# Patient Record
Sex: Male | Born: 1955 | Race: White | Hispanic: No | Marital: Single | Smoking: Never smoker
Health system: Southern US, Community
[De-identification: ages and names within clinical notes are randomized; demographics above are authoritative.]

## PROBLEM LIST (undated history)

## (undated) DIAGNOSIS — F40240 Claustrophobia: Secondary | ICD-10-CM

## (undated) DIAGNOSIS — J45909 Unspecified asthma, uncomplicated: Secondary | ICD-10-CM

## (undated) DIAGNOSIS — Q893 Situs inversus: Secondary | ICD-10-CM

## (undated) DIAGNOSIS — N182 Chronic kidney disease, stage 2 (mild): Secondary | ICD-10-CM

## (undated) DIAGNOSIS — E7800 Pure hypercholesterolemia, unspecified: Secondary | ICD-10-CM

## (undated) DIAGNOSIS — I10 Essential (primary) hypertension: Secondary | ICD-10-CM

## (undated) DIAGNOSIS — M069 Rheumatoid arthritis, unspecified: Secondary | ICD-10-CM

## (undated) DIAGNOSIS — I2510 Atherosclerotic heart disease of native coronary artery without angina pectoris: Secondary | ICD-10-CM

## (undated) DIAGNOSIS — F419 Anxiety disorder, unspecified: Secondary | ICD-10-CM

## (undated) DIAGNOSIS — I509 Heart failure, unspecified: Secondary | ICD-10-CM

## (undated) DIAGNOSIS — I639 Cerebral infarction, unspecified: Secondary | ICD-10-CM

## (undated) DIAGNOSIS — G459 Transient cerebral ischemic attack, unspecified: Secondary | ICD-10-CM

## (undated) DIAGNOSIS — K219 Gastro-esophageal reflux disease without esophagitis: Secondary | ICD-10-CM

## (undated) DIAGNOSIS — L4050 Arthropathic psoriasis, unspecified: Secondary | ICD-10-CM

## (undated) DIAGNOSIS — E119 Type 2 diabetes mellitus without complications: Secondary | ICD-10-CM

## (undated) DIAGNOSIS — Z951 Presence of aortocoronary bypass graft: Secondary | ICD-10-CM

## (undated) HISTORY — PX: PATELLA FRACTURE SURGERY: SHX735

## (undated) HISTORY — DX: Cerebral infarction, unspecified: I63.9

## (undated) HISTORY — PX: APPENDECTOMY: SHX54

## (undated) HISTORY — PX: INGUINAL HERNIA REPAIR: SUR1180

## (undated) HISTORY — PX: TIBIA FRACTURE SURGERY: SHX806

## (undated) HISTORY — PX: FRACTURE SURGERY: SHX138

---

## 2009-10-03 HISTORY — PX: CORONARY ARTERY BYPASS GRAFT: SHX141

## 2011-12-04 HISTORY — PX: INCISION AND DRAINAGE: SHX5863

## 2016-02-27 ENCOUNTER — Inpatient Hospital Stay (HOSPITAL_COMMUNITY)
Admission: EM | Admit: 2016-02-27 | Discharge: 2016-03-04 | DRG: 287 | Disposition: A | Discharge status: Home / Self Care | Payer: Medicaid Other | Attending: Student in an Organized Health Care Education/Training Program | Admitting: Student in an Organized Health Care Education/Training Program

## 2016-02-27 ENCOUNTER — Emergency Department (HOSPITAL_COMMUNITY): Payer: Medicaid Other

## 2016-02-27 ENCOUNTER — Encounter (HOSPITAL_COMMUNITY): Payer: Self-pay | Admitting: Emergency Medicine

## 2016-02-27 DIAGNOSIS — I429 Cardiomyopathy, unspecified: Secondary | ICD-10-CM | POA: Diagnosis not present

## 2016-02-27 DIAGNOSIS — Z91138 Patient's unintentional underdosing of medication regimen for other reason: Secondary | ICD-10-CM | POA: Diagnosis not present

## 2016-02-27 DIAGNOSIS — Q893 Situs inversus: Secondary | ICD-10-CM

## 2016-02-27 DIAGNOSIS — I25719 Atherosclerosis of autologous vein coronary artery bypass graft(s) with unspecified angina pectoris: Secondary | ICD-10-CM | POA: Diagnosis present

## 2016-02-27 DIAGNOSIS — J45909 Unspecified asthma, uncomplicated: Secondary | ICD-10-CM | POA: Diagnosis present

## 2016-02-27 DIAGNOSIS — Z9119 Patient's noncompliance with other medical treatment and regimen: Secondary | ICD-10-CM | POA: Diagnosis not present

## 2016-02-27 DIAGNOSIS — E8779 Other fluid overload: Secondary | ICD-10-CM | POA: Diagnosis not present

## 2016-02-27 DIAGNOSIS — I2582 Chronic total occlusion of coronary artery: Secondary | ICD-10-CM | POA: Diagnosis present

## 2016-02-27 DIAGNOSIS — T50996A Underdosing of other drugs, medicaments and biological substances, initial encounter: Secondary | ICD-10-CM | POA: Diagnosis present

## 2016-02-27 DIAGNOSIS — I5023 Acute on chronic systolic (congestive) heart failure: Secondary | ICD-10-CM | POA: Diagnosis not present

## 2016-02-27 DIAGNOSIS — R739 Hyperglycemia, unspecified: Secondary | ICD-10-CM

## 2016-02-27 DIAGNOSIS — Z8249 Family history of ischemic heart disease and other diseases of the circulatory system: Secondary | ICD-10-CM | POA: Diagnosis not present

## 2016-02-27 DIAGNOSIS — N049 Nephrotic syndrome with unspecified morphologic changes: Secondary | ICD-10-CM | POA: Diagnosis present

## 2016-02-27 DIAGNOSIS — T508X5A Adverse effect of diagnostic agents, initial encounter: Secondary | ICD-10-CM | POA: Diagnosis present

## 2016-02-27 DIAGNOSIS — E1121 Type 2 diabetes mellitus with diabetic nephropathy: Secondary | ICD-10-CM | POA: Diagnosis present

## 2016-02-27 DIAGNOSIS — E8770 Fluid overload, unspecified: Secondary | ICD-10-CM | POA: Diagnosis not present

## 2016-02-27 DIAGNOSIS — I110 Hypertensive heart disease with heart failure: Principal | ICD-10-CM | POA: Diagnosis present

## 2016-02-27 DIAGNOSIS — M069 Rheumatoid arthritis, unspecified: Secondary | ICD-10-CM | POA: Diagnosis present

## 2016-02-27 DIAGNOSIS — J810 Acute pulmonary edema: Secondary | ICD-10-CM | POA: Diagnosis not present

## 2016-02-27 DIAGNOSIS — E785 Hyperlipidemia, unspecified: Secondary | ICD-10-CM

## 2016-02-27 DIAGNOSIS — N179 Acute kidney failure, unspecified: Secondary | ICD-10-CM | POA: Diagnosis present

## 2016-02-27 DIAGNOSIS — Z951 Presence of aortocoronary bypass graft: Secondary | ICD-10-CM | POA: Diagnosis not present

## 2016-02-27 DIAGNOSIS — L4050 Arthropathic psoriasis, unspecified: Secondary | ICD-10-CM | POA: Diagnosis present

## 2016-02-27 DIAGNOSIS — E1165 Type 2 diabetes mellitus with hyperglycemia: Secondary | ICD-10-CM | POA: Diagnosis present

## 2016-02-27 DIAGNOSIS — I25810 Atherosclerosis of coronary artery bypass graft(s) without angina pectoris: Secondary | ICD-10-CM | POA: Diagnosis present

## 2016-02-27 DIAGNOSIS — I25119 Atherosclerotic heart disease of native coronary artery with unspecified angina pectoris: Secondary | ICD-10-CM | POA: Diagnosis present

## 2016-02-27 DIAGNOSIS — F418 Other specified anxiety disorders: Secondary | ICD-10-CM | POA: Diagnosis present

## 2016-02-27 DIAGNOSIS — I493 Ventricular premature depolarization: Secondary | ICD-10-CM | POA: Diagnosis present

## 2016-02-27 DIAGNOSIS — N189 Chronic kidney disease, unspecified: Secondary | ICD-10-CM | POA: Diagnosis present

## 2016-02-27 DIAGNOSIS — I272 Other secondary pulmonary hypertension: Secondary | ICD-10-CM | POA: Diagnosis present

## 2016-02-27 DIAGNOSIS — E7800 Pure hypercholesterolemia, unspecified: Secondary | ICD-10-CM | POA: Diagnosis present

## 2016-02-27 DIAGNOSIS — I25708 Atherosclerosis of coronary artery bypass graft(s), unspecified, with other forms of angina pectoris: Secondary | ICD-10-CM | POA: Diagnosis not present

## 2016-02-27 DIAGNOSIS — I2510 Atherosclerotic heart disease of native coronary artery without angina pectoris: Secondary | ICD-10-CM | POA: Diagnosis not present

## 2016-02-27 DIAGNOSIS — Z9114 Patient's other noncompliance with medication regimen: Secondary | ICD-10-CM | POA: Diagnosis not present

## 2016-02-27 DIAGNOSIS — L409 Psoriasis, unspecified: Secondary | ICD-10-CM

## 2016-02-27 DIAGNOSIS — E8809 Other disorders of plasma-protein metabolism, not elsewhere classified: Secondary | ICD-10-CM

## 2016-02-27 DIAGNOSIS — I420 Dilated cardiomyopathy: Secondary | ICD-10-CM | POA: Diagnosis present

## 2016-02-27 DIAGNOSIS — R0602 Shortness of breath: Secondary | ICD-10-CM

## 2016-02-27 DIAGNOSIS — I255 Ischemic cardiomyopathy: Secondary | ICD-10-CM | POA: Diagnosis present

## 2016-02-27 DIAGNOSIS — I011 Acute rheumatic endocarditis: Secondary | ICD-10-CM

## 2016-02-27 DIAGNOSIS — R609 Edema, unspecified: Secondary | ICD-10-CM | POA: Diagnosis present

## 2016-02-27 DIAGNOSIS — I5041 Acute combined systolic (congestive) and diastolic (congestive) heart failure: Secondary | ICD-10-CM | POA: Diagnosis present

## 2016-02-27 DIAGNOSIS — Z955 Presence of coronary angioplasty implant and graft: Secondary | ICD-10-CM | POA: Diagnosis not present

## 2016-02-27 DIAGNOSIS — R808 Other proteinuria: Secondary | ICD-10-CM | POA: Diagnosis not present

## 2016-02-27 DIAGNOSIS — Z882 Allergy status to sulfonamides status: Secondary | ICD-10-CM

## 2016-02-27 DIAGNOSIS — R079 Chest pain, unspecified: Secondary | ICD-10-CM | POA: Diagnosis present

## 2016-02-27 DIAGNOSIS — I5031 Acute diastolic (congestive) heart failure: Secondary | ICD-10-CM | POA: Diagnosis not present

## 2016-02-27 HISTORY — DX: Gastro-esophageal reflux disease without esophagitis: K21.9

## 2016-02-27 HISTORY — DX: Type 2 diabetes mellitus without complications: E11.9

## 2016-02-27 HISTORY — DX: Atherosclerotic heart disease of native coronary artery without angina pectoris: I25.10

## 2016-02-27 HISTORY — DX: Rheumatoid arthritis, unspecified: M06.9

## 2016-02-27 HISTORY — DX: Pure hypercholesterolemia, unspecified: E78.00

## 2016-02-27 HISTORY — DX: Essential (primary) hypertension: I10

## 2016-02-27 HISTORY — DX: Arthropathic psoriasis, unspecified: L40.50

## 2016-02-27 HISTORY — DX: Unspecified asthma, uncomplicated: J45.909

## 2016-02-27 HISTORY — DX: Situs inversus: Q89.3

## 2016-02-27 HISTORY — DX: Anxiety disorder, unspecified: F41.9

## 2016-02-27 HISTORY — DX: Presence of aortocoronary bypass graft: Z95.1

## 2016-02-27 LAB — URINALYSIS, ROUTINE W REFLEX MICROSCOPIC
BILIRUBIN URINE: NEGATIVE
Glucose, UA: >1000 mg/dL — AB
Ketones, ur: NEGATIVE mg/dL
Leukocytes, UA: NEGATIVE
NITRITE: NEGATIVE
SPECIFIC GRAVITY, URINE: 1.03 (ref 1.005–1.030)
pH: 5.5 (ref 5.0–8.0)

## 2016-02-27 LAB — CBC
HCT: 47.4 % (ref 39.0–52.0)
HEMOGLOBIN: 14.8 g/dL (ref 13.0–17.0)
MCH: 26.1 pg (ref 26.0–34.0)
MCHC: 31.2 g/dL (ref 30.0–36.0)
MCV: 83.6 fL (ref 78.0–100.0)
PLATELETS: 276 10*3/uL (ref 150–400)
RBC: 5.67 MIL/uL (ref 4.22–5.81)
RDW: 15 % (ref 11.5–15.5)
WBC: 11.3 10*3/uL — AB (ref 4.0–10.5)

## 2016-02-27 LAB — URINE MICROSCOPIC-ADD ON

## 2016-02-27 LAB — BRAIN NATRIURETIC PEPTIDE: B NATRIURETIC PEPTIDE 5: 2978.8 pg/mL — AB (ref 0.0–100.0)

## 2016-02-27 LAB — LIPASE, BLOOD: Lipase: 49 U/L (ref 11–51)

## 2016-02-27 LAB — COMPREHENSIVE METABOLIC PANEL
ALK PHOS: 94 U/L (ref 38–126)
ALT: 20 U/L (ref 17–63)
ANION GAP: 10 (ref 5–15)
AST: 23 U/L (ref 15–41)
Albumin: 2.6 g/dL — ABNORMAL LOW (ref 3.5–5.0)
BILIRUBIN TOTAL: 0.8 mg/dL (ref 0.3–1.2)
BUN: 24 mg/dL — ABNORMAL HIGH (ref 6–20)
CALCIUM: 8.5 mg/dL — AB (ref 8.9–10.3)
CO2: 22 mmol/L (ref 22–32)
CREATININE: 1.51 mg/dL — AB (ref 0.61–1.24)
Chloride: 100 mmol/L — ABNORMAL LOW (ref 101–111)
GFR, EST AFRICAN AMERICAN: 57 mL/min — AB (ref >60)
GFR, EST NON AFRICAN AMERICAN: 50 mL/min — AB (ref >60)
Glucose, Bld: 408 mg/dL — ABNORMAL HIGH (ref 65–99)
Potassium: 4.4 mmol/L (ref 3.5–5.1)
SODIUM: 132 mmol/L — AB (ref 135–145)
TOTAL PROTEIN: 5.7 g/dL — AB (ref 6.5–8.1)

## 2016-02-27 LAB — I-STAT TROPONIN, ED: TROPONIN I, POC: 0.07 ng/mL (ref 0.00–0.08)

## 2016-02-27 MED ORDER — SODIUM CHLORIDE 0.9% FLUSH
3.0000 mL | Freq: Two times a day (BID) | INTRAVENOUS | Status: DC
  Administered 2016-02-28 – 2016-03-04 (×7): 3 mL via INTRAVENOUS

## 2016-02-27 MED ORDER — PANTOPRAZOLE SODIUM 40 MG PO TBEC
40.0000 mg | DELAYED_RELEASE_TABLET | Freq: Every day | ORAL | Status: DC
  Administered 2016-02-28 – 2016-03-04 (×6): 40 mg via ORAL
  Filled 2016-02-27 (×6): qty 1

## 2016-02-27 MED ORDER — FUROSEMIDE 10 MG/ML IJ SOLN
40.0000 mg | Freq: Two times a day (BID) | INTRAMUSCULAR | Status: DC
  Administered 2016-02-28 – 2016-03-03 (×9): 40 mg via INTRAVENOUS
  Filled 2016-02-27 (×9): qty 4

## 2016-02-27 MED ORDER — INSULIN ASPART 100 UNIT/ML ~~LOC~~ SOLN
0.0000 [IU] | Freq: Three times a day (TID) | SUBCUTANEOUS | Status: DC
  Administered 2016-02-28 (×2): 2 [IU] via SUBCUTANEOUS
  Administered 2016-02-28: 1 [IU] via SUBCUTANEOUS
  Administered 2016-02-29: 2 [IU] via SUBCUTANEOUS
  Administered 2016-02-29: 3 [IU] via SUBCUTANEOUS
  Administered 2016-02-29: 2 [IU] via SUBCUTANEOUS
  Administered 2016-03-01: 3 [IU] via SUBCUTANEOUS

## 2016-02-27 MED ORDER — ENOXAPARIN SODIUM 40 MG/0.4ML ~~LOC~~ SOLN
40.0000 mg | SUBCUTANEOUS | Status: DC
  Administered 2016-02-28 – 2016-03-04 (×5): 40 mg via SUBCUTANEOUS
  Filled 2016-02-27 (×5): qty 0.4

## 2016-02-27 MED ORDER — FUROSEMIDE 10 MG/ML IJ SOLN
40.0000 mg | Freq: Once | INTRAMUSCULAR | Status: AC
  Administered 2016-02-27: 40 mg via INTRAVENOUS
  Filled 2016-02-27: qty 4

## 2016-02-27 NOTE — ED Provider Notes (Signed)
CSN: [id number]     Arrival date & time 02/27/16  1539 History   First MD Initiated Contact with Patient 02/27/16 1946     Chief Complaint  Patient presents with  . Groin Swelling  . Abdominal Pain     (Consider location/radiation/quality/duration/timing/severity/associated sxs/prior Treatment) HPI Comments: Patient with history of CABG 5, situs inversus, uncontrolled diabetes (has been off medications due to no insurance) -- presents with acute onset of gradually worsening lower extremity, scrotal, and lower abdominal edema for 2 weeks. Patient has had worsening fatigue, shortness of breath with exertion. He states that he has been propping himself up at night to sleep and wakes up at times gasping for breath. States unknown weight gain. Shortness of breath resolves after he sits for about 15-20 minutes. No history of cirrhosis or kidney problems. No history of heart failure. Patient is not on any medications including diuretics. No blood thinners. He denies bleeding in the urine or stool. No fever, chest pain. Symptoms improve with rest and are worse with ambulation or activity.   Patient is a 60 y.o. male presenting with abdominal pain. The history is provided by the patient.  Abdominal Pain Associated symptoms: shortness of breath   Associated symptoms: no chest pain, no cough, no diarrhea, no dysuria, no fever, no nausea, no sore throat and no vomiting     Past Medical History  Diagnosis Date  . Dextrocardia   . Diabetes mellitus without complication (HCC)    History reviewed. No pertinent past surgical history. History reviewed. No pertinent family history. Social History  Substance Use Topics  . Smoking status: Never Smoker   . Smokeless tobacco: None  . Alcohol Use: No    Review of Systems  Constitutional: Negative for fever.  HENT: Negative for rhinorrhea and sore throat.   Eyes: Negative for redness.  Respiratory: Positive for shortness of breath. Negative for cough  and wheezing.   Cardiovascular: Positive for leg swelling. Negative for chest pain.  Gastrointestinal: Positive for abdominal pain. Negative for nausea, vomiting and diarrhea.  Genitourinary: Positive for scrotal swelling. Negative for dysuria, frequency, discharge, penile swelling, penile pain and testicular pain.  Musculoskeletal: Negative for myalgias.  Skin: Negative for rash.  Neurological: Negative for headaches.      Allergies  Sulfa antibiotics  Home Medications   Prior to Admission medications   Not on File   BP 139/94 mmHg  Pulse 101  Temp(Src) 98.5 F (36.9 C) (Oral)  Resp 24  SpO2 94% Physical Exam  Constitutional: He appears well-developed and well-nourished.  HENT:  Head: Normocephalic and atraumatic.  Mouth/Throat: Oropharynx is clear and moist.  Eyes: Conjunctivae are normal. Right eye exhibits no discharge. Left eye exhibits no discharge.  Neck: Normal range of motion. Neck supple.  Cardiovascular: Normal rate, regular rhythm and normal heart sounds.   No murmur heard. Situs inversus.   Pulmonary/Chest: Effort normal. No respiratory distress. He has no wheezes. He has rales.  Abdominal: Soft. There is no tenderness. There is no rebound and no guarding.  Genitourinary: Right testis shows swelling. Right testis shows no mass and no tenderness. Left testis shows swelling. Left testis shows no mass and no tenderness. Uncircumcised.  Moderate swelling of scrotum without cellulitis of abscess.   Neurological: He is alert.  Skin: Skin is warm and dry.  Psychiatric: He has a normal mood and affect.  Nursing note and vitals reviewed.   ED Course  Procedures (including critical care time) Labs Review Labs Reviewed  COMPREHENSIVE  METABOLIC PANEL - Abnormal; Notable for the following:    Sodium 132 (*)    Chloride 100 (*)    Glucose, Bld 408 (*)    BUN 24 (*)    Creatinine, Ser 1.51 (*)    Calcium 8.5 (*)    Total Protein 5.7 (*)    Albumin 2.6 (*)     GFR calc non Af Amer 50 (*)    GFR calc Af Amer 57 (*)    All other components within normal limits  CBC - Abnormal; Notable for the following:    WBC 11.3 (*)    All other components within normal limits  URINALYSIS, ROUTINE W REFLEX MICROSCOPIC (NOT AT [facility]) - Abnormal; Notable for the following:    Glucose, UA >1000 (*)    Hgb urine dipstick MODERATE (*)    Protein, ur >300 (*)    All other components within normal limits  BRAIN NATRIURETIC PEPTIDE - Abnormal; Notable for the following:    B Natriuretic Peptide 2978.8 (*)    All other components within normal limits  URINE MICROSCOPIC-ADD ON - Abnormal; Notable for the following:    Squamous Epithelial / LPF 0-5 (*)    Bacteria, UA RARE (*)    Casts HYALINE CASTS (*)    All other components within normal limits  LIPASE, BLOOD  I-STAT TROPOININ, ED    Imaging Review Dg Chest 2 View  02/27/2016  CLINICAL DATA:  Shortness of breath.  CHF.  Situs inversus. EXAM: CHEST  2 VIEW COMPARISON:  None. FINDINGS: Sternotomy wires are aligned. There are discontinuities of the 2 lower most sternotomy wires. CABG clips overlie the mediastinum. There is dextrocardia with mild cardiomegaly. The mediastinal contour otherwise appears normal. No pneumothorax. Trace bilateral pleural effusions. Borderline mild pulmonary edema. Curvilinear opacities at the right lung base. IMPRESSION: 1. Dextrocardia with mild cardiomegaly and borderline mild pulmonary edema, suggesting mild congestive heart failure. 2. Trace bilateral pleural effusions. 3. Curvilinear opacities at the right lung base, favor mild scarring or atelectasis. Electronically Signed   By: Delbert Phenix M.D.   On: 02/27/2016 21:05   I have personally reviewed and evaluated these images and lab results as part of my medical decision-making.   EKG Interpretation   Date/Time:  Monday February 27 2016 19:56:44 EDT Ventricular Rate:  100 PR Interval:  163 QRS Duration: 88 QT Interval:  370 QTC  Calculation: 477 R Axis:   -88 Text Interpretation:  Sinus tachycardia Probable left atrial enlargement  Left anterior fascicular block Low voltage, extremity and precordial leads  RSR' in V1 or V2, probably normal variant Nonspecific T abnormalities,  lateral leads Borderline prolonged QT interval No previous ECGs available  Confirmed by [facility]  MD, ELLIOTT ([id number]) on 02/27/2016 8:24:42 PM       Patient seen and examined. Work-up initiated. Discussed with Dr. Effie Shy.   Vital signs reviewed and are as follows: BP 139/94 mmHg  Pulse 101  Temp(Src) 98.5 F (36.9 C) (Oral)  Resp 24  SpO2 94%  8:35 PM Patient has ambulated with oxygen saturation to 92%.   9:36 PM Patient and friend at bedside updated on results. I have spoken with [facility]MT[facility] who will admit patient. Orders placed.   MDM   Final diagnoses:  Peripheral edema  Shortness of breath  Hyperglycemia  Hypoalbuminemia   Admit. New onset CHF vs nephrosis.    Renne Crigler, PA-C 02/27/16 2137  Mancel Bale, MD 02/28/16 [phone]

## 2016-02-27 NOTE — ED Notes (Signed)
PA at bedside.

## 2016-02-27 NOTE — ED Notes (Signed)
Patient transported to X-ray 

## 2016-02-27 NOTE — ED Notes (Signed)
Admitting at bedside 

## 2016-02-27 NOTE — ED Notes (Addendum)
Spo2 While ambulating 92%

## 2016-02-27 NOTE — ED Notes (Signed)
Pt sts swelling in scrotum and abd area; pt sts swelling in legs x several days

## 2016-02-28 ENCOUNTER — Encounter (HOSPITAL_COMMUNITY): Payer: Self-pay | Admitting: Internal Medicine

## 2016-02-28 ENCOUNTER — Inpatient Hospital Stay (HOSPITAL_COMMUNITY): Payer: Medicaid Other

## 2016-02-28 DIAGNOSIS — I25810 Atherosclerosis of coronary artery bypass graft(s) without angina pectoris: Secondary | ICD-10-CM | POA: Diagnosis present

## 2016-02-28 DIAGNOSIS — I429 Cardiomyopathy, unspecified: Secondary | ICD-10-CM

## 2016-02-28 DIAGNOSIS — N049 Nephrotic syndrome with unspecified morphologic changes: Secondary | ICD-10-CM

## 2016-02-28 DIAGNOSIS — R0600 Dyspnea, unspecified: Secondary | ICD-10-CM

## 2016-02-28 DIAGNOSIS — Q893 Situs inversus: Secondary | ICD-10-CM

## 2016-02-28 DIAGNOSIS — E785 Hyperlipidemia, unspecified: Secondary | ICD-10-CM

## 2016-02-28 DIAGNOSIS — R079 Chest pain, unspecified: Secondary | ICD-10-CM | POA: Diagnosis present

## 2016-02-28 DIAGNOSIS — E8779 Other fluid overload: Secondary | ICD-10-CM

## 2016-02-28 DIAGNOSIS — N179 Acute kidney failure, unspecified: Secondary | ICD-10-CM

## 2016-02-28 DIAGNOSIS — I25708 Atherosclerosis of coronary artery bypass graft(s), unspecified, with other forms of angina pectoris: Secondary | ICD-10-CM

## 2016-02-28 DIAGNOSIS — I011 Acute rheumatic endocarditis: Secondary | ICD-10-CM

## 2016-02-28 DIAGNOSIS — R609 Edema, unspecified: Secondary | ICD-10-CM

## 2016-02-28 DIAGNOSIS — J45909 Unspecified asthma, uncomplicated: Secondary | ICD-10-CM

## 2016-02-28 LAB — GLUCOSE, CAPILLARY
GLUCOSE-CAPILLARY: 215 mg/dL — AB (ref 65–99)
GLUCOSE-CAPILLARY: 157 mg/dL — AB (ref 65–99)
GLUCOSE-CAPILLARY: 142 mg/dL — AB (ref 65–99)
Glucose-Capillary: 134 mg/dL — ABNORMAL HIGH (ref 65–99)
Glucose-Capillary: 200 mg/dL — ABNORMAL HIGH (ref 65–99)
Glucose-Capillary: 394 mg/dL — ABNORMAL HIGH (ref 65–99)

## 2016-02-28 LAB — PROTEIN / CREATININE RATIO, URINE
Creatinine, Urine: 21.04 mg/dL
Protein Creatinine Ratio: 6.65 mg/mg{Cre} — ABNORMAL HIGH (ref 0.00–0.15)
TOTAL PROTEIN, URINE: 140 mg/dL

## 2016-02-28 LAB — TROPONIN I
TROPONIN I: 0.06 ng/mL — AB (ref <0.031)
TROPONIN I: 0.07 ng/mL — AB (ref <0.031)
Troponin I: 0.06 ng/mL — ABNORMAL HIGH (ref <0.031)

## 2016-02-28 LAB — LIPID PANEL
CHOLESTEROL: 191 mg/dL (ref 0–200)
HDL: 49 mg/dL (ref >40)
LDL CALC: 120 mg/dL — AB (ref 0–99)
TRIGLYCERIDES: 110 mg/dL (ref <150)
Total CHOL/HDL Ratio: 3.9 RATIO
VLDL: 22 mg/dL (ref 0–40)

## 2016-02-28 LAB — ECHOCARDIOGRAM COMPLETE
HEIGHTINCHES: 66 in
WEIGHTICAEL: 2913.6 [oz_av]

## 2016-02-28 LAB — BASIC METABOLIC PANEL
Anion gap: 8 (ref 5–15)
BUN: 23 mg/dL — AB (ref 6–20)
CO2: 24 mmol/L (ref 22–32)
CREATININE: 1.3 mg/dL — AB (ref 0.61–1.24)
Calcium: 8.6 mg/dL — ABNORMAL LOW (ref 8.9–10.3)
Chloride: 104 mmol/L (ref 101–111)
GFR calc Af Amer: >60 mL/min (ref >60)
GFR calc non Af Amer: 59 mL/min — ABNORMAL LOW (ref >60)
Glucose, Bld: 169 mg/dL — ABNORMAL HIGH (ref 65–99)
POTASSIUM: 3.7 mmol/L (ref 3.5–5.1)
Sodium: 136 mmol/L (ref 135–145)

## 2016-02-28 LAB — HIV ANTIBODY (ROUTINE TESTING W REFLEX): HIV Screen 4th Generation wRfx: NONREACTIVE

## 2016-02-28 LAB — TSH: TSH: 1.446 u[IU]/mL (ref 0.350–4.500)

## 2016-02-28 LAB — CK: Total CK: 131 U/L (ref 49–397)

## 2016-02-28 MED ORDER — TRAZODONE HCL 50 MG PO TABS
50.0000 mg | ORAL_TABLET | Freq: Every evening | ORAL | Status: DC | PRN
  Administered 2016-02-29: 50 mg via ORAL
  Filled 2016-02-28: qty 1

## 2016-02-28 MED ORDER — LISINOPRIL 5 MG PO TABS: 5.0000 mg | ORAL_TABLET | Freq: Every day | ORAL | Status: DC

## 2016-02-28 MED ORDER — ASPIRIN EC 81 MG PO TBEC
81.0000 mg | DELAYED_RELEASE_TABLET | Freq: Every day | ORAL | Status: DC
  Administered 2016-02-28 – 2016-03-04 (×5): 81 mg via ORAL
  Filled 2016-02-28 (×5): qty 1

## 2016-02-28 MED ORDER — ACETAMINOPHEN 325 MG PO TABS
650.0000 mg | ORAL_TABLET | Freq: Four times a day (QID) | ORAL | Status: DC | PRN
  Administered 2016-02-28 – 2016-03-02 (×2): 650 mg via ORAL
  Filled 2016-02-28 (×2): qty 2

## 2016-02-28 MED ORDER — TRAZODONE HCL 50 MG PO TABS
50.0000 mg | ORAL_TABLET | Freq: Once | ORAL | Status: AC
  Administered 2016-02-28: 50 mg via ORAL
  Filled 2016-02-28: qty 1

## 2016-02-28 MED ORDER — INSULIN ASPART 100 UNIT/ML ~~LOC~~ SOLN
9.0000 [IU] | Freq: Once | SUBCUTANEOUS | Status: AC
  Administered 2016-02-28: 9 [IU] via SUBCUTANEOUS

## 2016-02-28 MED ORDER — LISINOPRIL 5 MG PO TABS
2.5000 mg | ORAL_TABLET | Freq: Every day | ORAL | Status: DC
  Administered 2016-02-28: 2.5 mg via ORAL
  Filled 2016-02-28: qty 1

## 2016-02-28 MED ORDER — PERFLUTREN LIPID MICROSPHERE
1.0000 mL | INTRAVENOUS | Status: AC | PRN
  Administered 2016-02-28: 2 mL via INTRAVENOUS
  Filled 2016-02-28: qty 10

## 2016-02-28 NOTE — Progress Notes (Signed)
Subjective: Good urine output with IV lasix. Continues to have significant leg swelling and exercise intolerance that worsened over the past few months. He reports that he gets a feeling of chest pressure with walking short distances, normally tolerating about 1block. He has no chest pain resting at this time. 2D echocardiography performed today that reported a LVEF of 10%  Objective: Vital signs in last 24 hours: Filed Vitals:   02/28/16 0456 02/28/16 0500 02/28/16 1022 02/28/16 1556  BP: 127/92  124/88 132/83  Pulse: 90  85 92  Temp: 97.9 F (36.6 C)  98.1 F (36.7 C) 98.2 F (36.8 C)  TempSrc: Oral  Oral Oral  Resp: 16   17  Height:      Weight:  82.6 kg (182 lb 1.6 oz)    SpO2: 94%  96% 97%   Weight change:   Intake/Output Summary (Last 24 hours) at 02/28/16 1634 Last data filed at 02/28/16 1159  Gross per 24 hour  Intake      0 ml  Output   2225 ml  Net  -2225 ml   GENERAL- alert, co-operative, NAD HEENT- oral mucosa appears moist CARDIAC- RRR, no murmurs, rubs or gallops. RESP- CTAB, no wheezes or crackles. EXTREMITIES- 3+ pitting edema in legs bilaterally, swan neck deformities in hands SKIN- Warm, dry, No rash or lesion. PSYCH- Normal mood and affect, appropriate thought content and speech.  Lab Results: Basic Metabolic Panel:  Recent Labs Lab 02/27/16 1607 02/28/16 0524  NA 132* 136  K 4.4 3.7  CL 100* 104  CO2 22 24  GLUCOSE 408* 169*  BUN 24* 23*  CREATININE 1.51* 1.30*  CALCIUM 8.5* 8.6*   Cardiac Enzymes:  Recent Labs Lab 02/27/16 2351 02/28/16 0524 02/28/16 1137  CKTOTAL  --   --  131  TROPONINI 0.06* 0.07* 0.06*   CBG:  Recent Labs Lab 02/28/16 0042 02/28/16 0418 02/28/16 0759 02/28/16 1157  GLUCAP 394* 215* 134* 157*   Fasting Lipid Panel:  Recent Labs Lab 02/28/16 0524  CHOL 191  HDL 49  LDLCALC 120*  TRIG 110  CHOLHDL 3.9   Urinalysis:  Recent Labs Lab 02/27/16 2002  COLORURINE YELLOW  LABSPEC 1.030    PHURINE 5.5  GLUCOSEU >1000*  HGBUR MODERATE*  BILIRUBINUR NEGATIVE  KETONESUR NEGATIVE  PROTEINUR >300*  NITRITE NEGATIVE  LEUKOCYTESUR NEGATIVE   Studies/Results: Dg Chest 2 View  02/27/2016  CLINICAL DATA:  Shortness of breath.  CHF.  Situs inversus. EXAM: CHEST  2 VIEW COMPARISON:  None. FINDINGS: Sternotomy wires are aligned. There are discontinuities of the 2 lower most sternotomy wires. CABG clips overlie the mediastinum. There is dextrocardia with mild cardiomegaly. The mediastinal contour otherwise appears normal. No pneumothorax. Trace bilateral pleural effusions. Borderline mild pulmonary edema. Curvilinear opacities at the right lung base. IMPRESSION: 1. Dextrocardia with mild cardiomegaly and borderline mild pulmonary edema, suggesting mild congestive heart failure. 2. Trace bilateral pleural effusions. 3. Curvilinear opacities at the right lung base, favor mild scarring or atelectasis. Electronically Signed   By: Delbert Phenix M.D.   On: 02/27/2016 21:05   Medications: I have reviewed the patient's current medications. Scheduled Meds: . aspirin EC  81 mg Oral Daily  . enoxaparin (LOVENOX) injection  40 mg Subcutaneous Q24H  . furosemide  40 mg Intravenous Q12H  . insulin aspart  0-9 Units Subcutaneous TID WC  . pantoprazole  40 mg Oral Daily  . sodium chloride flush  3 mL Intravenous Q12H   Continuous Infusions:  PRN Meds:. Assessment/Plan: Volume overload: Admission with severe pedal and some scrotal edema and almost 20 lbs above his usual baseline weight. LVEF previously 55-60% on TTE in 2011, now 10% on repeat today with grade III diastolic dysfunction. 2L out with IV diuresis today. Serial troponins remain slightly elevated at 0.06 with peak of 0.07. Heart function likely acutely down due to his volume overload but also concerning for progression of his heart disease. -ASA 81mg  -Continue lasix 40mg  IV BID -Cardiac monitoring -Need strict I/Os -Repeat Bmet with  diuresis -Consult to cardiology  CAD: He has stable exertional dyspnea and anginal symptoms worsening over the past few months. EKG and troponins do not suggest acute ischemia at this time. He had previous CABG for severe multivessel disease in 2010 but not taking any medications or following up with physicians for several years. He was previously cared for [facility] for complication of chest wall abscess while on Humira and methotrexate for his rheumatoid arthritis. 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 CK of 131 was checked due to some hemoglobinuria without hematuria. LDL mildly elevated at 120 with otherwise normal lipid profile. Plan to start lisinopril at a low dose as he may be in unknown degree of AKI.  T2DM, uncontrolled: CBGs now controlled, 408 on admission. HgbA1c is pending. -Carb diet -SSI-S  Diet: Carb mod VTE ppx: [place] enoxaparin FULL CODE  Dispo: Disposition is deferred at this time, awaiting improvement of current medical problems.   The patient does not have a current PCP (No Pcp Per Patient) and does need an [facility] hospital follow-up appointment after discharge.  The patient does not have transportation limitations that hinder transportation to clinic appointments.  LOS: 1 day   [facility], MD 02/28/2016, 4:34 PM

## 2016-02-28 NOTE — Progress Notes (Signed)
  Echocardiogram 2D Echocardiogram has been performed.  Travis Tran 02/28/2016, 2:31 PM
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
## 2016-02-28 NOTE — Progress Notes (Signed)
Pt arrived to unit alert and oriented x4. Oriented to room, unit, and staff.  Skin assessed with Dominica, Charity fundraiser. BLE edema and Bottom noted to be reddened but blanchable. Foam dressing applied to sacrum. Pt states "I know it's like that. I lay on my bottom a lot". Foam dressing placed. Tele placed and initiated. Bed in lowest position and call bell is within reach. Will continue to monitor.

## 2016-02-28 NOTE — Care Management Note (Signed)
Case Management Note  Patient Details  Name: Travis Tran MRN: [id number] Date of Birth: 03/30/1956  Subjective/Objective:                 Spoke with patient in room, he states that he does not have insurance or a PCP. He is interested in [facility]. Patient received pamphlet from [facility]. CM explained to patient that they may use the on site pharmacy to fill prescriptions given to them at discharge. Patient aware that the [facility] will not fill narcotics or pain medications prior to the patient being seen by one of their physicians.  Patient aware that they must be seen as a patient prior to the pharmacy filling the prescriptions a second time. Appointment entered in AVS. Patient denies any difficulties caring for himself at home. No other CM resources needed at this time.   Action/Plan:  Will F/U w/ [facility] for PCP and medication assistance.  Expected Discharge Date:                  Expected Discharge Plan:  Home/Self Care  In-House Referral:     Discharge planning Services  CM Consult, Indigent [facility] Clinic  Post Acute Care Choice:  NA Choice offered to:  Patient  DME Arranged:    DME Agency:     HH Arranged:    HH Agency:     Status of Service:  In process, will continue to follow  Medicare Important Message Given:    Date Medicare IM Given:    Medicare IM give by:    Date Additional Medicare IM Given:    Additional Medicare Important Message give by:     If discussed at Long Length of Stay Meetings, dates discussed:    Additional Comments:  Lawerance Sabal, RN 02/28/2016, 11:31 AM
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
## 2016-02-29 ENCOUNTER — Encounter (HOSPITAL_COMMUNITY): Payer: Self-pay | Admitting: Physician Assistant

## 2016-02-29 DIAGNOSIS — I5031 Acute diastolic (congestive) heart failure: Secondary | ICD-10-CM

## 2016-02-29 DIAGNOSIS — I2510 Atherosclerotic heart disease of native coronary artery without angina pectoris: Secondary | ICD-10-CM

## 2016-02-29 DIAGNOSIS — E8809 Other disorders of plasma-protein metabolism, not elsewhere classified: Secondary | ICD-10-CM | POA: Insufficient documentation

## 2016-02-29 DIAGNOSIS — R808 Other proteinuria: Secondary | ICD-10-CM

## 2016-02-29 LAB — GLUCOSE, CAPILLARY
GLUCOSE-CAPILLARY: 199 mg/dL — AB (ref 65–99)
GLUCOSE-CAPILLARY: 158 mg/dL — AB (ref 65–99)
GLUCOSE-CAPILLARY: 238 mg/dL — AB (ref 65–99)
GLUCOSE-CAPILLARY: 191 mg/dL — AB (ref 65–99)
Glucose-Capillary: 207 mg/dL — ABNORMAL HIGH (ref 65–99)
Glucose-Capillary: 232 mg/dL — ABNORMAL HIGH (ref 65–99)

## 2016-02-29 LAB — HEPATIC FUNCTION PANEL
ALBUMIN: 2.1 g/dL — AB (ref 3.5–5.0)
ALT: 16 U/L — ABNORMAL LOW (ref 17–63)
AST: 20 U/L (ref 15–41)
Alkaline Phosphatase: 83 U/L (ref 38–126)
Bilirubin, Direct: 0.1 mg/dL (ref 0.1–0.5)
Indirect Bilirubin: 0.5 mg/dL (ref 0.3–0.9)
TOTAL PROTEIN: 5.1 g/dL — AB (ref 6.5–8.1)
Total Bilirubin: 0.6 mg/dL (ref 0.3–1.2)

## 2016-02-29 LAB — BASIC METABOLIC PANEL
Anion gap: 9 (ref 5–15)
BUN: 20 mg/dL (ref 6–20)
CHLORIDE: 101 mmol/L (ref 101–111)
CO2: 28 mmol/L (ref 22–32)
CREATININE: 1.24 mg/dL (ref 0.61–1.24)
Calcium: 8.4 mg/dL — ABNORMAL LOW (ref 8.9–10.3)
GFR calc Af Amer: >60 mL/min (ref >60)
GFR calc non Af Amer: >60 mL/min (ref >60)
Glucose, Bld: 213 mg/dL — ABNORMAL HIGH (ref 65–99)
Potassium: 3.7 mmol/L (ref 3.5–5.1)
SODIUM: 138 mmol/L (ref 135–145)

## 2016-02-29 LAB — HIV ANTIBODY (ROUTINE TESTING W REFLEX): HIV Screen 4th Generation wRfx: NONREACTIVE

## 2016-02-29 LAB — HEMOGLOBIN A1C
HEMOGLOBIN A1C: 15.1 % — AB (ref 4.8–5.6)
Mean Plasma Glucose: 387 mg/dL

## 2016-02-29 MED ORDER — METFORMIN HCL 500 MG PO TABS: 500.0000 mg | ORAL_TABLET | Freq: Two times a day (BID) | ORAL | Status: DC

## 2016-02-29 MED ORDER — LISINOPRIL 2.5 MG PO TABS
2.5000 mg | ORAL_TABLET | Freq: Every day | ORAL | Status: DC
  Administered 2016-03-02: 2.5 mg via ORAL
  Filled 2016-02-29: qty 1

## 2016-02-29 MED ORDER — INSULIN GLARGINE 100 UNIT/ML ~~LOC~~ SOLN
2.0000 [IU] | Freq: Every day | SUBCUTANEOUS | Status: DC
  Administered 2016-02-29: 2 [IU] via SUBCUTANEOUS
  Filled 2016-02-29: qty 0.02

## 2016-02-29 MED ORDER — CARVEDILOL 3.125 MG PO TABS
3.1250 mg | ORAL_TABLET | Freq: Two times a day (BID) | ORAL | Status: DC
  Administered 2016-02-29 – 2016-03-02 (×4): 3.125 mg via ORAL
  Filled 2016-02-29 (×4): qty 1

## 2016-02-29 MED ORDER — ATORVASTATIN CALCIUM 40 MG PO TABS
40.0000 mg | ORAL_TABLET | Freq: Every day | ORAL | Status: DC
  Administered 2016-02-29 – 2016-03-02 (×3): 40 mg via ORAL
  Filled 2016-02-29 (×3): qty 1

## 2016-02-29 MED ORDER — LISINOPRIL 5 MG PO TABS
5.0000 mg | ORAL_TABLET | Freq: Every day | ORAL | Status: DC
  Administered 2016-02-29: 2.5 mg via ORAL
  Filled 2016-02-29: qty 1

## 2016-02-29 NOTE — Progress Notes (Addendum)
Patient Name: Travis Tran Date of Encounter: 02/29/2016  Principal Problem:   Peripheral edema Active Problems:   Fluid overload   AKI (acute kidney injury) (HCC)   Chest pain   Rheumatoid aortitis (HCC)   CAD (coronary artery disease)   HLD (hyperlipidemia)   Situs inversus   Asthma   Cardiomyopathy [facility])   Primary Cardiologist: New>>Dr Katrinka Blazing Patient Profile: situs inversus, CABG X 5 at [facility] in 2011. Previously IDDM but wt loss>> diet controlled DM, HTN, hyperlipidemia and severe RA and psoriatic arthritis presented to ER 02/27/16 for edema, swelling. EF 10% w/ grade 3 dd.  SUBJECTIVE: Aware that he still has extra volume on board, but wants to go ahead and get cath done. Tired of waiting. Breathing better.   OBJECTIVE Filed Vitals:   02/28/16 2330 02/29/16 0506 02/29/16 0509 02/29/16 0926  BP: 122/83 115/75  105/65  Pulse: 87 83  83  Temp: 98.3 F (36.8 C) 97.9 F (36.6 C)    TempSrc: Oral Oral    Resp: 18 16    Height:      Weight:   172 lb 14.4 oz (78.427 kg)   SpO2: 97% 100%      Intake/Output Summary (Last 24 hours) at 02/29/16 1227 Last data filed at 02/29/16 0939  Gross per 24 hour  Intake    420 ml  Output      0 ml  Net    420 ml   Filed Weights   02/27/16 2306 02/28/16 0500 02/29/16 0509  Weight: 183 lb (83.008 kg) 182 lb 1.6 oz (82.6 kg) 172 lb 14.4 oz (78.427 kg)    PHYSICAL EXAM General: Well developed, well nourished, male in no acute distress. Head: Normocephalic, atraumatic.  Neck: Supple without bruits, JVD. Lungs:  Resp regular and unlabored, CTA. Heart: RRR, S1, S2, no S3, S4, or murmur; no rub. Abdomen: Soft, non-tender, non-distended, BS + x 4.  Extremities: No clubbing, cyanosis, edema.  Neuro: Alert and oriented X 3. Moves all extremities spontaneously. Psych: Normal affect.  LABS: CBC:  Recent Labs  02/27/16 1607  WBC 11.3*  HGB 14.8  HCT 47.4  MCV 83.6  PLT 276   Basic Metabolic Panel:  Recent Labs  40/98/11 0524 02/29/16 0549  NA 136 138  K 3.7 3.7  CL 104 101  CO2 24 28  GLUCOSE 169* 213*  BUN 23* 20  CREATININE 1.30* 1.24  CALCIUM 8.6* 8.4*   Liver Function Tests:  Recent Labs  02/27/16 1607 02/29/16 0741  AST 23 20  ALT 20 16*  ALKPHOS 94 83  BILITOT 0.8 0.6  PROT 5.7* 5.1*  ALBUMIN 2.6* 2.1*   Cardiac Enzymes:  Recent Labs  02/27/16 2351 02/28/16 0524 02/28/16 1137  CKTOTAL  --   --  131  TROPONINI 0.06* 0.07* 0.06*    Recent Labs  02/27/16 2155  TROPIPOC 0.07   BNP:  B NATRIURETIC PEPTIDE  Date/Time Value Ref Range Status  02/27/2016 08:03 PM 2978.8* 0.0 - 100.0 pg/mL Final   Hemoglobin A1C:  Recent Labs  02/27/16 2351  HGBA1C 15.1*   Fasting Lipid Panel:  Recent Labs  02/28/16 0524  CHOL 191  HDL 49  LDLCALC 120*  TRIG 110  CHOLHDL 3.9   Thyroid Function Tests:  Recent Labs  02/27/16 2351  TSH 1.446   TELE: SR, PVCs     CATH: 09/30/2009 CORONOARY CINEOGRAPHY: Left main: Large-caliber vessel had a 55% ostial and a 55% proximal stenosis. Left  Anterior Descending: The left anterior descending artery had a proximal 50% stenosis and mid 99% stenosis, another mid 99% stenosis and a distal 70% stenosis. The diagonal branch of the LAD was also severely diseased with a 99% narrowing. Circumflex: The circumflex appeared free of significant disease with a large obtuse marginal branch having a 95-98% stenosis. Right Coronary Artery: The right coronary artery had a distal 95-98% stenosis. There was a proximal 45% stenosis of the right coronary artery. Posterior Lateral Artery: The posterior lateral artery had an 85-90% stenosis and another 95% stenosis. CONCLUSIONS: 1. Severe triple-vessel coronary artery disease with a 55% ostial stenosis of the left main. 2. Well-preserved ejection fraction of 55%. 3. Elevated end-diastolic pressure.  CABG: 10/03/2009  From d/c summary: On 10/03/09, the patient underwent coronary artery  bypass x5. The right internal mammary artery was grafted to the left anterior descending and reverse saphenous vein grafts to the obtuse marginal, diagonal, and posterior descending artery and distal right coronary artery sequentially. For further details, please refer to Dr. Erasmo Leventhal dictated operative report.   Radiology/Studies: Dg Chest 2 View 02/27/2016  CLINICAL DATA:  Shortness of breath.  CHF.  Situs inversus. EXAM: CHEST  2 VIEW COMPARISON:  None. FINDINGS: Sternotomy wires are aligned. There are discontinuities of the 2 lower most sternotomy wires. CABG clips overlie the mediastinum. There is dextrocardia with mild cardiomegaly. The mediastinal contour otherwise appears normal. No pneumothorax. Trace bilateral pleural effusions. Borderline mild pulmonary edema. Curvilinear opacities at the right lung base. IMPRESSION: 1. Dextrocardia with mild cardiomegaly and borderline mild pulmonary edema, suggesting mild congestive heart failure. 2. Trace bilateral pleural effusions. 3. Curvilinear opacities at the right lung base, favor mild scarring or atelectasis. Electronically Signed   By: Delbert Phenix M.D.   On: 02/27/2016 21:05     Current Medications:  . aspirin EC  81 mg Oral Daily  . enoxaparin (LOVENOX) injection  40 mg Subcutaneous Q24H  . furosemide  40 mg Intravenous Q12H  . insulin aspart  0-9 Units Subcutaneous TID WC  . [START ON 03/01/2016] lisinopril  2.5 mg Oral Daily  . pantoprazole  40 mg Oral Daily  . sodium chloride flush  3 mL Intravenous Q12H      ASSESSMENT AND PLAN: 1. Acute combined systolic and diastolic CHF:  Acute combined systolic and diastolic heart failure, etiology is uncertain. LV cavity size is normal. Possible myocardial depression related to recent viral illness. Rule out bypass graft failure.  Generalized edema secondary to heart failure and nephrotic syndrome.  Diurese as tolerated by blood pressure and kidneys. TSH normal.  Coronary angiography  will need to be performed when the patient is compensated to rule out bypass graft failure.  Heart failure therapy to include beta blocker(carvedilol 3.125 mg twice a day), ACE(lisinopril 2.5 mg) inhibitor therapy, diuretic therapy, and possibly digoxin should be instituted.   Will add Coreg today, if does not tolerate, may be able to use Toprol XL 12.5 mg given at noon.  Quantitate proteinuria to determine if nephrotic syndrome is present  .  2. CAD:  CABG data from d/c summary is above, grafts are RIMA-LAD, SVG-D, SVG-OM, SVG-PDA-dRCA  Prev cath was from groin  Otherwise, per IM Principal Problem:   Peripheral edema Active Problems:   Fluid overload   AKI (acute kidney injury) (HCC)   Chest pain   Rheumatoid aortitis (HCC)   CAD (coronary artery disease)   HLD (hyperlipidemia)   Situs inversus   Asthma   Cardiomyopathy (HCC)  Signed, Theodore Demark , PA-C 12:27 PM 02/29/2016 The patient has been seen in conjunction with Theodore Demark, PA-C. All aspects of care have been considered and discussed. The patient has been personally interviewed, examined, and all clinical data has been reviewed.    Weight has decreased from 180 to 172 pounds since admission.  He has had only mild diuresis if I and O is correct. Based upon weight, current diuretic dose is probably adequate. He is down 10 pounds.  He is able to lie flat. Therefore,  left or right heart catheterization will be performed performed tomorrow. Will keep nothing by mouth after midnight with final decision in a.m. based upon his clinical state. The patient was counseled to undergo left heart catheterization, coronary angiography, and possible percutaneous coronary intervention with stent implantation. The procedural risks and benefits were discussed in detail. The risks discussed included death, stroke, myocardial infarction, life-threatening bleeding, limb ischemia, kidney injury, allergy, and possible emergency  cardiac surgery. The risk of these significant complications were estimated to occur less than 1% of the time. After discussion, the patient has agreed to proceed. We need accurate intake and output recordings. We gave accurate daily weights.

## 2016-02-29 NOTE — Progress Notes (Signed)
Subjective: He continues a good urine output on BID lasix however has lot of external drinks and fruits from guests so I/O challenging. Swelling in legs is decreasing but remains significant. No dyspnea at rest or chest pain. He is eager to proceed with heart cath and rest of workup as soon as possible.  Objective: Vital signs in last 24 hours: Filed Vitals:   02/29/16 0506 02/29/16 0509 02/29/16 0926 02/29/16 1317  BP: 115/75  105/65 115/76  Pulse: 83  83 81  Temp: 97.9 F (36.6 C)   97.7 F (36.5 C)  TempSrc: Oral   Oral  Resp: 16   18  Height:      Weight:  78.427 kg (172 lb 14.4 oz)    SpO2: 100%   99%   Weight change: -4.581 kg (-10 lb 1.6 oz)  Intake/Output Summary (Last 24 hours) at 02/29/16 1455 Last data filed at 02/29/16 0939  Gross per 24 hour  Intake    420 ml  Output      0 ml  Net    420 ml   GENERAL- alert, co-operative, NAD HEENT- oral mucosa appears moist CARDIAC- RRR, no murmurs, rubs or gallops. RESP- CTAB, no wheezes or crackles. EXTREMITIES- 2+ pitting edema in legs bilaterally from below knee, swan neck deformities in hands SKIN- Warm, dry, rashes on 1st right MTP, extensor surface on arms PSYCH- Anxious about hospitalization and his health, appropriate thought content and speech.  Lab Results: Basic Metabolic Panel:  Recent Labs Lab 02/28/16 0524 02/29/16 0549  NA 136 138  K 3.7 3.7  CL 104 101  CO2 24 28  GLUCOSE 169* 213*  BUN 23* 20  CREATININE 1.30* 1.24  CALCIUM 8.6* 8.4*   CBG:  Recent Labs Lab 02/28/16 1728 02/28/16 2020 02/28/16 2326 02/29/16 0423 02/29/16 0802 02/29/16 1242  GLUCAP 142* 207* 200* 232* 199* 158*    Studies/Results: Dg Chest 2 View  02/27/2016  CLINICAL DATA:  Shortness of breath.  CHF.  Situs inversus. EXAM: CHEST  2 VIEW COMPARISON:  None. FINDINGS: Sternotomy wires are aligned. There are discontinuities of the 2 lower most sternotomy wires. CABG clips overlie the mediastinum. There is dextrocardia  with mild cardiomegaly. The mediastinal contour otherwise appears normal. No pneumothorax. Trace bilateral pleural effusions. Borderline mild pulmonary edema. Curvilinear opacities at the right lung base. IMPRESSION: 1. Dextrocardia with mild cardiomegaly and borderline mild pulmonary edema, suggesting mild congestive heart failure. 2. Trace bilateral pleural effusions. 3. Curvilinear opacities at the right lung base, favor mild scarring or atelectasis. Electronically Signed   By: Delbert Phenix M.D.   On: 02/27/2016 21:05   Medications: I have reviewed the patient's current medications. Scheduled Meds: . aspirin EC  81 mg Oral Daily  . carvedilol  3.125 mg Oral BID WC  . enoxaparin (LOVENOX) injection  40 mg Subcutaneous Q24H  . furosemide  40 mg Intravenous Q12H  . insulin aspart  0-9 Units Subcutaneous TID WC  . [START ON 03/01/2016] lisinopril  2.5 mg Oral Daily  . pantoprazole  40 mg Oral Daily  . sodium chloride flush  3 mL Intravenous Q12H   Continuous Infusions:  PRN Meds:. Assessment/Plan: Volume overload: New severe heart failure per TTE yesterday with LVEF 10%, grade 3 diastolic dysfunction. Fair weight loss with diuresis although exact volumes unclear. Lots of external drinks and foods in room.[name] Respiratory status is good able to tolerate lying flat. SCr improving with diuresis so far. He does not seem to have much  intravascular volume overload so diuresis rate will be limited by fluid shift. -Continue lasix 40mg  IV BID -Cardiac monitoring -I/Os not highly accurate so far, daily weights -Repeat Bmet daily with diuresis -Consult to cardiology, recs appreciated  CAD: Pending heart cath sometime in next few days for evaluation of his grafts that are at severe risk of having stenosed over the past few years off medical treatment. His current disease sounds progressive over the past few months. 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 Spot prot/Cr  was 6.65. Given this extremely likely etiology invasive workup does not seem indicated. -Lisinopril 2.5mg  today  T2DM, uncontrolled: CBGs now controlled, 408 on admission. HgbA1c 15.1% consistent with completely untreated disease causing nephrotic range proteinuria. -Carb diet -SSI-S  Diet: Carb mod VTE ppx: [place] enoxaparin FULL CODE  Dispo: Disposition is deferred at this time, awaiting improvement of current medical problems.   The patient does not have a current PCP (No Pcp Per Patient) and does need an [facility] hospital follow-up appointment after discharge.  The patient does not have transportation limitations that hinder transportation to clinic appointments.  LOS: 2 days   , MD 02/29/2016, 2:55 PM

## 2016-03-01 ENCOUNTER — Encounter (HOSPITAL_COMMUNITY)
Admission: EM | Disposition: A | Discharge status: Home / Self Care | Payer: Self-pay | Source: Home / Self Care | Attending: Student in an Organized Health Care Education/Training Program

## 2016-03-01 DIAGNOSIS — I25119 Atherosclerotic heart disease of native coronary artery with unspecified angina pectoris: Secondary | ICD-10-CM

## 2016-03-01 DIAGNOSIS — F419 Anxiety disorder, unspecified: Secondary | ICD-10-CM

## 2016-03-01 HISTORY — PX: CARDIAC CATHETERIZATION: SHX172

## 2016-03-01 LAB — BASIC METABOLIC PANEL
ANION GAP: 9 (ref 5–15)
BUN: 18 mg/dL (ref 6–20)
CALCIUM: 8.7 mg/dL — AB (ref 8.9–10.3)
CO2: 27 mmol/L (ref 22–32)
Chloride: 102 mmol/L (ref 101–111)
Creatinine, Ser: 1.12 mg/dL (ref 0.61–1.24)
GFR calc Af Amer: >60 mL/min (ref >60)
GLUCOSE: 219 mg/dL — AB (ref 65–99)
POTASSIUM: 4.1 mmol/L (ref 3.5–5.1)
SODIUM: 138 mmol/L (ref 135–145)

## 2016-03-01 LAB — POCT I-STAT 3, VENOUS BLOOD GAS (G3P V)
ACID-BASE EXCESS: 5 mmol/L — AB (ref 0.0–2.0)
Acid-Base Excess: 6 mmol/L — ABNORMAL HIGH (ref 0.0–2.0)
Bicarbonate: 32 mEq/L — ABNORMAL HIGH (ref 20.0–24.0)
Bicarbonate: 32.9 mEq/L — ABNORMAL HIGH (ref 20.0–24.0)
O2 SAT: 63 %
O2 SAT: 65 %
PCO2 VEN: 56.8 mmHg — AB (ref 45.0–50.0)
PO2 VEN: 35 mmHg (ref 31.0–45.0)
TCO2: 34 mmol/L (ref 0–100)
TCO2: 35 mmol/L (ref 0–100)
pCO2, Ven: 58.4 mmHg — ABNORMAL HIGH (ref 45.0–50.0)
pH, Ven: 7.358 — ABNORMAL HIGH (ref 7.250–7.300)
pH, Ven: 7.359 — ABNORMAL HIGH (ref 7.250–7.300)
pO2, Ven: 36 mmHg (ref 31.0–45.0)

## 2016-03-01 LAB — PROTIME-INR
INR: 1.07 (ref 0.00–1.49)
PROTHROMBIN TIME: 14.1 s (ref 11.6–15.2)

## 2016-03-01 LAB — POCT I-STAT 3, ART BLOOD GAS (G3+)
Acid-Base Excess: 4 mmol/L — ABNORMAL HIGH (ref 0.0–2.0)
Bicarbonate: 31.2 mEq/L — ABNORMAL HIGH (ref 20.0–24.0)
O2 Saturation: 95 %
PH ART: 7.359 (ref 7.350–7.450)
TCO2: 33 mmol/L (ref 0–100)
pCO2 arterial: 55.4 mmHg — ABNORMAL HIGH (ref 35.0–45.0)
pO2, Arterial: 78 mmHg — ABNORMAL LOW (ref 80.0–100.0)

## 2016-03-01 LAB — GLUCOSE, CAPILLARY
GLUCOSE-CAPILLARY: 194 mg/dL — AB (ref 65–99)
GLUCOSE-CAPILLARY: 238 mg/dL — AB (ref 65–99)
Glucose-Capillary: 223 mg/dL — ABNORMAL HIGH (ref 65–99)
Glucose-Capillary: 242 mg/dL — ABNORMAL HIGH (ref 65–99)
Glucose-Capillary: 235 mg/dL — ABNORMAL HIGH (ref 65–99)

## 2016-03-01 LAB — CBC
HCT: 43.3 % (ref 39.0–52.0)
HEMATOCRIT: 45.9 % (ref 39.0–52.0)
Hemoglobin: 14.1 g/dL (ref 13.0–17.0)
Hemoglobin: 13.6 g/dL (ref 13.0–17.0)
MCH: 25.5 pg — ABNORMAL LOW (ref 26.0–34.0)
MCH: 26.2 pg (ref 26.0–34.0)
MCHC: 30.7 g/dL (ref 30.0–36.0)
MCHC: 31.4 g/dL (ref 30.0–36.0)
MCV: 83.2 fL (ref 78.0–100.0)
MCV: 83.3 fL (ref 78.0–100.0)
PLATELETS: 224 10*3/uL (ref 150–400)
PLATELETS: 178 10*3/uL (ref 150–400)
RBC: 5.52 MIL/uL (ref 4.22–5.81)
RBC: 5.2 MIL/uL (ref 4.22–5.81)
RDW: 15.2 % (ref 11.5–15.5)
RDW: 15.1 % (ref 11.5–15.5)
WBC: 8 10*3/uL (ref 4.0–10.5)
WBC: 6.9 10*3/uL (ref 4.0–10.5)

## 2016-03-01 LAB — CREATININE, SERUM: CREATININE: 1.1 mg/dL (ref 0.61–1.24)

## 2016-03-01 SURGERY — RIGHT/LEFT HEART CATH AND CORONARY/GRAFT ANGIOGRAPHY

## 2016-03-01 MED ORDER — HEPARIN SODIUM (PORCINE) 1000 UNIT/ML IJ SOLN
INTRAMUSCULAR | Status: DC | PRN
  Administered 2016-03-01: 4000 [IU] via INTRAVENOUS

## 2016-03-01 MED ORDER — HEPARIN (PORCINE) IN NACL 2-0.9 UNIT/ML-% IJ SOLN
INTRAMUSCULAR | Status: AC
  Filled 2016-03-01: qty 1000

## 2016-03-01 MED ORDER — ASPIRIN 81 MG PO CHEW
81.0000 mg | CHEWABLE_TABLET | ORAL | Status: AC
  Administered 2016-03-01: 81 mg via ORAL
  Filled 2016-03-01: qty 1

## 2016-03-01 MED ORDER — FENTANYL CITRATE (PF) 100 MCG/2ML IJ SOLN
INTRAMUSCULAR | Status: AC
  Filled 2016-03-01: qty 2

## 2016-03-01 MED ORDER — IOPAMIDOL (ISOVUE-370) INJECTION 76%
INTRAVENOUS | Status: AC
  Filled 2016-03-01: qty 100

## 2016-03-01 MED ORDER — VERAPAMIL HCL 2.5 MG/ML IV SOLN
INTRAVENOUS | Status: DC | PRN
  Administered 2016-03-01: 12:00:00 via INTRA_ARTERIAL

## 2016-03-01 MED ORDER — INSULIN GLARGINE 100 UNIT/ML ~~LOC~~ SOLN
5.0000 [IU] | Freq: Every day | SUBCUTANEOUS | Status: DC
  Administered 2016-03-01: 5 [IU] via SUBCUTANEOUS
  Filled 2016-03-01 (×2): qty 0.05

## 2016-03-01 MED ORDER — SODIUM CHLORIDE 0.9% FLUSH
3.0000 mL | Freq: Two times a day (BID) | INTRAVENOUS | Status: DC
  Administered 2016-03-01: 3 mL via INTRAVENOUS

## 2016-03-01 MED ORDER — INSULIN GLARGINE 100 UNIT/ML ~~LOC~~ SOLN: 4.0000 [IU] | Freq: Every day | SUBCUTANEOUS | Status: DC

## 2016-03-01 MED ORDER — VERAPAMIL HCL 2.5 MG/ML IV SOLN
INTRAVENOUS | Status: AC
  Filled 2016-03-01: qty 2

## 2016-03-01 MED ORDER — MIDAZOLAM HCL 2 MG/2ML IJ SOLN
INTRAMUSCULAR | Status: DC | PRN
  Administered 2016-03-01: 2 mg via INTRAVENOUS

## 2016-03-01 MED ORDER — SODIUM CHLORIDE 0.9% FLUSH: 3.0000 mL | INTRAVENOUS | Status: DC | PRN

## 2016-03-01 MED ORDER — SODIUM CHLORIDE 0.9 % IV SOLN
250.0000 mL | INTRAVENOUS | Status: DC | PRN
  Administered 2016-03-01: 250 mL via INTRAVENOUS

## 2016-03-01 MED ORDER — HEPARIN SODIUM (PORCINE) 1000 UNIT/ML IJ SOLN
INTRAMUSCULAR | Status: AC
  Filled 2016-03-01: qty 1

## 2016-03-01 MED ORDER — LIVING WELL WITH DIABETES BOOK
Freq: Once | Status: DC
  Filled 2016-03-01: qty 1

## 2016-03-01 MED ORDER — SODIUM CHLORIDE 0.9 % WEIGHT BASED INFUSION
1.0000 mL/kg/h | INTRAVENOUS | Status: DC
  Administered 2016-03-01: 1 mL/kg/h via INTRAVENOUS

## 2016-03-01 MED ORDER — HEPARIN (PORCINE) IN NACL 2-0.9 UNIT/ML-% IJ SOLN
INTRAMUSCULAR | Status: DC | PRN
  Administered 2016-03-01: 13:00:00

## 2016-03-01 MED ORDER — SODIUM CHLORIDE 0.9% FLUSH
3.0000 mL | Freq: Two times a day (BID) | INTRAVENOUS | Status: DC
  Administered 2016-03-01 – 2016-03-04 (×6): 3 mL via INTRAVENOUS

## 2016-03-01 MED ORDER — LIDOCAINE HCL (PF) 1 % IJ SOLN
INTRAMUSCULAR | Status: AC
  Filled 2016-03-01: qty 30

## 2016-03-01 MED ORDER — IOPAMIDOL (ISOVUE-370) INJECTION 76%
INTRAVENOUS | Status: DC | PRN
  Administered 2016-03-01: 155 mL via INTRA_ARTERIAL

## 2016-03-01 MED ORDER — SODIUM CHLORIDE 0.9 % WEIGHT BASED INFUSION: 1.0000 mL/kg/h | INTRAVENOUS | Status: AC

## 2016-03-01 MED ORDER — FENTANYL CITRATE (PF) 100 MCG/2ML IJ SOLN
INTRAMUSCULAR | Status: DC | PRN
  Administered 2016-03-01: 25 ug via INTRAVENOUS

## 2016-03-01 MED ORDER — SODIUM CHLORIDE 0.9 % IV SOLN: 250.0000 mL | INTRAVENOUS | Status: DC | PRN

## 2016-03-01 MED ORDER — MIDAZOLAM HCL 2 MG/2ML IJ SOLN
INTRAMUSCULAR | Status: AC
  Filled 2016-03-01: qty 2

## 2016-03-01 MED ORDER — INSULIN ASPART 100 UNIT/ML ~~LOC~~ SOLN
0.0000 [IU] | Freq: Three times a day (TID) | SUBCUTANEOUS | Status: DC
  Administered 2016-03-01: 5 [IU] via SUBCUTANEOUS
  Administered 2016-03-02: 3 [IU] via SUBCUTANEOUS
  Administered 2016-03-02: 5 [IU] via SUBCUTANEOUS
  Administered 2016-03-02: 3 [IU] via SUBCUTANEOUS
  Administered 2016-03-03: 2 [IU] via SUBCUTANEOUS
  Administered 2016-03-03: 3 [IU] via SUBCUTANEOUS
  Administered 2016-03-03: 2 [IU] via SUBCUTANEOUS
  Administered 2016-03-04: 8 [IU] via SUBCUTANEOUS
  Administered 2016-03-04: 2 [IU] via SUBCUTANEOUS

## 2016-03-01 MED ORDER — LIDOCAINE HCL (PF) 1 % IJ SOLN
INTRAMUSCULAR | Status: DC | PRN
  Administered 2016-03-01: 5 mL via INTRADERMAL

## 2016-03-01 SURGICAL SUPPLY — 17 items
CATH BALLN WEDGE 5F 110CM (CATHETERS) ×4 IMPLANT
CATH INFINITI 5 FR IM (CATHETERS) ×4 IMPLANT
CATH INFINITI 5 FR JL3.5 (CATHETERS) ×4 IMPLANT
CATH INFINITI 5 FR MPA2 (CATHETERS) ×4 IMPLANT
CATH INFINITI 5FR AL1 (CATHETERS) ×4 IMPLANT
CATH INFINITI 5FR ANG PIGTAIL (CATHETERS) ×4 IMPLANT
CATH INFINITI JR4 5F (CATHETERS) ×4 IMPLANT
CATH OPTITORQUE TIG 4.0 5F (CATHETERS) ×4 IMPLANT
DEVICE RAD COMP TR BAND LRG (VASCULAR PRODUCTS) ×4 IMPLANT
GLIDESHEATH SLEND A-KIT 6F 22G (SHEATH) ×4 IMPLANT
GUIDEWIRE .025 260CM (WIRE) ×4 IMPLANT
KIT HEART LEFT (KITS) ×4 IMPLANT
PACK CARDIAC CATHETERIZATION (CUSTOM PROCEDURE TRAY) ×4 IMPLANT
SHEATH FAST CATH BRACH 5F 5CM (SHEATH) ×4 IMPLANT
TRANSDUCER W/STOPCOCK (MISCELLANEOUS) ×4 IMPLANT
TUBING CIL FLEX 10 FLL-RA (TUBING) ×4 IMPLANT
WIRE SAFE-T 1.5MM-J .035X260CM (WIRE) ×4 IMPLANT

## 2016-03-01 NOTE — H&P (View-Only) (Signed)
       Patient Name: Travis Tran Date of Encounter: 03/01/2016    SUBJECTIVE: Breathing better this AM until had to re-do consent. This caused anxiety and chest discomfort.  TELEMETRY:  NSR: Filed Vitals:   02/29/16 2254 03/01/16 0500 03/01/16 0549 03/01/16 0830  BP: 113/88  102/72 112/73  Pulse: 79  91 87  Temp: 99.4 F (37.4 C)  98.3 F (36.8 C)   TempSrc: Oral  Oral   Resp: 18     Height:      Weight:  176 lb 5.9 oz (80 kg)    SpO2: 91%  94%     Intake/Output Summary (Last 24 hours) at 03/01/16 1057 Last data filed at 03/01/16 0900  Gross per 24 hour  Intake    120 ml  Output      0 ml  Net    120 ml   LABS: Basic Metabolic Panel:  Recent Labs  56/43/32 0549 03/01/16 0606  NA 138 138  K 3.7 4.1  CL 101 102  CO2 28 27  GLUCOSE 213* 219*  BUN 20 18  CREATININE 1.24 1.12  CALCIUM 8.4* 8.7*   CBC:  Recent Labs  02/27/16 1607 03/01/16 0606  WBC 11.3* 8.0  HGB 14.8 14.1  HCT 47.4 45.9  MCV 83.6 83.2  PLT 276 224   Cardiac Enzymes:  Recent Labs  02/27/16 2351 02/28/16 0524 02/28/16 1137  CKTOTAL  --   --  131  TROPONINI 0.06* 0.07* 0.06*   BNP: Invalid input(s): POCBNP Hemoglobin A1C:  Recent Labs  02/27/16 2351  HGBA1C 15.1*   Fasting Lipid Panel:  Recent Labs  02/28/16 0524  CHOL 191  HDL 49  LDLCALC 120*  TRIG 110  CHOLHDL 3.9    Radiology/Studies:  No new data  Physical Exam: Blood pressure 112/73, pulse 87, temperature 98.3 F (36.8 C), temperature source Oral, resp. rate 18, height 5\' 6"  (1.676 m), weight 176 lb 5.9 oz (80 kg), SpO2 94 %. Weight change: 3 lb 7.5 oz (1.573 kg)  Wt Readings from Last 3 Encounters:  03/01/16 176 lb 5.9 oz (80 kg)   Still some LE edema Decreased lower lung breath sounds. ?S3 gallop  ASSESSMENT:  1. Ischemic cardiomyopathy and suspect graft occlusion. 2. CAD with previous CABG and angina 3. 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  Hyperlipidemia  Plan:  1. Cath today 2. Further optimize therapy after graft status known. 3. May need viability study  03/03/16 03/01/2016, 10:57 AM

## 2016-03-01 NOTE — Progress Notes (Signed)
       Patient Name: Travis Tran Date of Encounter: 03/01/2016    SUBJECTIVE: Breathing better this AM until had to re-do consent. This caused anxiety and chest discomfort.  TELEMETRY:  NSR: Filed Vitals:   02/29/16 2254 03/01/16 0500 03/01/16 0549 03/01/16 0830  BP: 113/88  102/72 112/73  Pulse: 79  91 87  Temp: 99.4 F (37.4 C)  98.3 F (36.8 C)   TempSrc: Oral  Oral   Resp: 18     Height:      Weight:  176 lb 5.9 oz (80 kg)    SpO2: 91%  94%     Intake/Output Summary (Last 24 hours) at 03/01/16 1057 Last data filed at 03/01/16 0900  Gross per 24 hour  Intake    120 ml  Output      0 ml  Net    120 ml   LABS: Basic Metabolic Panel:  Recent Labs  02/29/16 0549 03/01/16 0606  NA 138 138  K 3.7 4.1  CL 101 102  CO2 28 27  GLUCOSE 213* 219*  BUN 20 18  CREATININE 1.24 1.12  CALCIUM 8.4* 8.7*   CBC:  Recent Labs  02/27/16 1607 03/01/16 0606  WBC 11.3* 8.0  HGB 14.8 14.1  HCT 47.4 45.9  MCV 83.6 83.2  PLT 276 224   Cardiac Enzymes:  Recent Labs  02/27/16 2351 02/28/16 0524 02/28/16 1137  CKTOTAL  --   --  131  TROPONINI 0.06* 0.07* 0.06*   BNP: Invalid input(s): POCBNP Hemoglobin A1C:  Recent Labs  02/27/16 2351  HGBA1C 15.1*   Fasting Lipid Panel:  Recent Labs  02/28/16 0524  CHOL 191  HDL 49  LDLCALC 120*  TRIG 110  CHOLHDL 3.9    Radiology/Studies:  No new data  Physical Exam: Blood pressure 112/73, pulse 87, temperature 98.3 F (36.8 C), temperature source Oral, resp. rate 18, height 5' 6" (1.676 m), weight 176 lb 5.9 oz (80 kg), SpO2 94 %. Weight change: 3 lb 7.5 oz (1.573 kg)  Wt Readings from Last 3 Encounters:  03/01/16 176 lb 5.9 oz (80 kg)   Still some LE edema Decreased lower lung breath sounds. ?S3 gallop  ASSESSMENT:  1. Ischemic cardiomyopathy and suspect graft occlusion. 2. CAD with previous CABG and angina 3. 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  Hyperlipidemia  Plan:  1. Cath today 2. Further optimize therapy after graft status known. 3. May need viability study  Signed, SMITH III,HENRY W 03/01/2016, 10:57 AM 

## 2016-03-01 NOTE — Progress Notes (Signed)
Inpatient Diabetes Program Recommendations  AACE/ADA: New Consensus Statement on Inpatient Glycemic Control (2015)  Target Ranges:  Prepandial:   less than 140 mg/dL      Peak postprandial:   less than 180 mg/dL (1-2 hours)      Critically ill patients:  140 - 180 mg/dL   Review of Glycemic Control  Inpatient Diabetes Program Recommendations: HgbA1C: =15.1  This coordinator met with patient to discuss elevated A1C.  Pt states he was on insulin a few years ago but lost weight and his MD took him off of it.  States he has "fallen off the wagon" as far as taking care of his diabetes but thinks he can manage with diet again.  States he has been eating fast food and not monitoring his diet.  Again, he may need insulin until he is in better control and has made dietary changes but he may not be amenable to this.  No questions/concerns at the end of our conversation. Thank you  Raoul Pitch BSN, RN,CDE Inpatient Diabetes Coordinator [phone] (team pager)

## 2016-03-01 NOTE — Interval H&P Note (Signed)
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  The various methods of treatment have been discussed with the patient and family. After consideration of risks, benefits and other options for treatment, the patient has consented to  Procedure(s): Right/Left Heart Cath and Coronary Angiography (N/A) With Graft Angiography and Possible Percutaneous Coronary Intervention as a surgical intervention .  The patient's history has been reviewed, patient examined, no change in status, stable for surgery.  I have reviewed the patient's chart and labs.  Questions were answered to the patient's satisfaction.    Cath Lab Visit (complete for each Cath Lab visit)  Clinical Evaluation Leading to the Procedure:   ACS: No.  Non-ACS:    Anginal Classification: CCS IV  - CHF  Anti-ischemic medical therapy: Minimal Therapy (1 class of medications)  Non-Invasive Test Results: High-risk stress test findings: cardiac mortality >3%/year - Echo EF 10%  Prior CABG: Previous CABG  Ischemic Symptoms? CCS IV (Inability to carry on any physical activity without discomfort) Anti-ischemic Medical Therapy? Minimal Therapy (1 class of medications) Non-invasive Test Results? High-risk stress test findings: cardiac mortality >3%/yr Prior CABG? Previous CABG   Patient Information:   >=1 SVG stenosis  A (7)  Indication: 54; Score: 7   Patient Information:   All bypass grafts patent, >=1 lesion(s) in native coronaries without bypass grafts  A (7)  Indication: 54; Score: 7   Patient Information:   Native 3V-CAD, failure of multiple grafts, depressed LVEF, patent LIMA graft PCI  U (6)  Indication: 68; Score: 6   Patient Information:   Native 3V-CAD, failure of multiple grafts, depressed LVEF, patent LIMA graft CABG  A (7)  Indication: 68;  Score: 7   Patient Information:   Native 3V-CAD, failure of multiple grafts, depressed LVEF, nonfunctional LIMA graft PCI  A (8)  Indication: 69; Score: 8   Patient Information:   Native 3V-CAD, failure of multiple grafts, depressed LVEF, nonfunctional LIMA graft CABG  U (6)  Indication: 69; Score: 6   Kingsly Kloepfer W

## 2016-03-01 NOTE — Progress Notes (Signed)
Subjective: Exact I/Os on diuresis unclear, recorded weight slightly up from yesterday but this is probably a more accurate reading a little over 2kg down from admission. NPO this morning for anticipate heart cath today.  Objective: Vital signs in last 24 hours: Filed Vitals:   02/29/16 2254 03/01/16 0500 03/01/16 0549 03/01/16 0830  BP: 113/88  102/72 112/73  Pulse: 79  91 87  Temp: 99.4 F (37.4 C)  98.3 F (36.8 C)   TempSrc: Oral  Oral   Resp: 18     Height:      Weight:  80 kg (176 lb 5.9 oz)    SpO2: 91%  94%    Weight change: 1.573 kg (3 lb 7.5 oz)  Intake/Output Summary (Last 24 hours) at 03/01/16 1109 Last data filed at 03/01/16 0900  Gross per 24 hour  Intake    120 ml  Output      0 ml  Net    120 ml   GENERAL- alert, co-operative, NAD EXTREMITIES- 2+ pitting edema in legs bilaterally from below knee, swan neck deformities in hands SKIN- Warm, dry, rash on extensor surface on arms PSYCH- Anxious about hospitalization and his health, appropriate thought content and speech.  Lab Results: Basic Metabolic Panel:  Recent Labs Lab 02/29/16 0549 03/01/16 0606  NA 138 138  K 3.7 4.1  CL 101 102  CO2 28 27  GLUCOSE 213* 219*  BUN 20 18  CREATININE 1.24 1.12  CALCIUM 8.4* 8.7*   CBG:  Recent Labs Lab 02/29/16 1242 02/29/16 1737 02/29/16 2041 03/01/16 0036 03/01/16 0547 03/01/16 0803  GLUCAP 158* 238* 191* 238* 223* 242*    Studies/Results: No results found. Medications: I have reviewed the patient's current medications. Scheduled Meds: . aspirin EC  81 mg Oral Daily  . atorvastatin  40 mg Oral q1800  . carvedilol  3.125 mg Oral BID WC  . enoxaparin (LOVENOX) injection  40 mg Subcutaneous Q24H  . furosemide  40 mg Intravenous Q12H  . insulin aspart  0-15 Units Subcutaneous TID WC  . insulin glargine  5 Units Subcutaneous QHS  . lisinopril  2.5 mg Oral Daily  . pantoprazole  40 mg Oral Daily  . sodium chloride flush  3 mL Intravenous Q12H    . sodium chloride flush  3 mL Intravenous Q12H   Continuous Infusions: . sodium chloride 1 mL/kg/hr (03/01/16 0540)   PRN Meds:. Assessment/Plan: Volume overload: Mild diuresis, weight ?up today. Much more aggressive treatment may be limited due to blood pressure in the 100s-110s/70s. Symptomatically he is feeling some improvement but remains edematous and easily becomes more dyspneic. -Continue lasix 40mg  IV BID -Cardiac monitoring -I/Os not highly accurate so far, daily weights -Repeat Bmet daily with diuresis -Consult to cardiology, recs appreciated  CAD: Heart cath today. High suspicion for occluded bypass grafts given years of no medical management. Started statin treatment for his known ischemic heart disease, hyperlipidemia, diabetes. -F/U results and cardiology recs from heart catheterization -ASA 81mg  -Coreg 3.25mg  BID -Atorvastatin 20mg   T2DM, uncontrolled: Started lantus last night and increased supplemental insulin. Shows a significant response to low insulin doses and should be a good candidate for starting oral therapies. CBGs higher into 200s this morning and will increase dose. Will plan to start oral agents probably tomorrow if appropriate after heart cath today. -Lantus to 5U -SSI-M  Anxiety: Significant healthcare associated anxiety, probably contributing to his noncompliance and lack of follow up prior to admission. Feeling chest tightness and dyspnea  when discussing his heart cath this morning. Reassurance at this time and may consider pharmacotherapy if this seems to be more generalized, debilitating, or frequent.  Diet: Carb mod VTE ppx: [place] enoxaparin FULL CODE  Dispo: Disposition is deferred at this time, awaiting improvement of current medical problems.   The patient does not have a current PCP (No Pcp Per Patient) and does need an [facility] hospital follow-up appointment after discharge.  The patient does not have transportation limitations that hinder  transportation to clinic appointments.  LOS: 3 days   Fuller Plan, MD 03/01/2016, 11:09 AM

## 2016-03-02 ENCOUNTER — Encounter (HOSPITAL_COMMUNITY): Payer: Self-pay | Admitting: Cardiology

## 2016-03-02 DIAGNOSIS — I011 Acute rheumatic endocarditis: Secondary | ICD-10-CM

## 2016-03-02 DIAGNOSIS — Z79899 Other long term (current) drug therapy: Secondary | ICD-10-CM

## 2016-03-02 DIAGNOSIS — Z794 Long term (current) use of insulin: Secondary | ICD-10-CM

## 2016-03-02 DIAGNOSIS — N179 Acute kidney failure, unspecified: Secondary | ICD-10-CM

## 2016-03-02 DIAGNOSIS — Z7982 Long term (current) use of aspirin: Secondary | ICD-10-CM

## 2016-03-02 DIAGNOSIS — Z955 Presence of coronary angioplasty implant and graft: Secondary | ICD-10-CM

## 2016-03-02 LAB — BASIC METABOLIC PANEL
ANION GAP: 7 (ref 5–15)
BUN: 20 mg/dL (ref 6–20)
CO2: 31 mmol/L (ref 22–32)
Calcium: 8.4 mg/dL — ABNORMAL LOW (ref 8.9–10.3)
Chloride: 97 mmol/L — ABNORMAL LOW (ref 101–111)
Creatinine, Ser: 1.33 mg/dL — ABNORMAL HIGH (ref 0.61–1.24)
GFR calc non Af Amer: 58 mL/min — ABNORMAL LOW (ref >60)
GLUCOSE: 300 mg/dL — AB (ref 65–99)
POTASSIUM: 4 mmol/L (ref 3.5–5.1)
Sodium: 135 mmol/L (ref 135–145)

## 2016-03-02 LAB — GLUCOSE, CAPILLARY
GLUCOSE-CAPILLARY: 212 mg/dL — AB (ref 65–99)
GLUCOSE-CAPILLARY: 151 mg/dL — AB (ref 65–99)
GLUCOSE-CAPILLARY: 104 mg/dL — AB (ref 65–99)
Glucose-Capillary: 173 mg/dL — ABNORMAL HIGH (ref 65–99)

## 2016-03-02 MED ORDER — CARVEDILOL 6.25 MG PO TABS
6.2500 mg | ORAL_TABLET | Freq: Two times a day (BID) | ORAL | Status: DC
  Administered 2016-03-02 – 2016-03-04 (×4): 6.25 mg via ORAL
  Filled 2016-03-02 (×4): qty 1

## 2016-03-02 MED ORDER — INSULIN GLARGINE 100 UNIT/ML ~~LOC~~ SOLN
10.0000 [IU] | Freq: Every day | SUBCUTANEOUS | Status: DC
Start: 2016-03-02 — End: 2016-03-04
  Administered 2016-03-02 – 2016-03-03 (×2): 10 [IU] via SUBCUTANEOUS
  Filled 2016-03-02 (×3): qty 0.1

## 2016-03-02 MED ORDER — ISOSORBIDE MONONITRATE ER 60 MG PO TB24
60.0000 mg | ORAL_TABLET | Freq: Every day | ORAL | Status: DC
  Administered 2016-03-02: 60 mg via ORAL
  Filled 2016-03-02 (×2): qty 1

## 2016-03-02 NOTE — Hospital Discharge Follow-Up (Signed)
[facility]:   This Case Manager spoke with Shawnie Pons, RN CM who indicated patient needing hospital follow-up appointment for CAD/ischemic cardiomyopathy. Patient is uninsured and does not have a PCP. Discussed the medical management and resources (including onsite pharmacy, Financial Counseling) available at [facility] and [facility]. Patient appreciative of information and agreeable to appointment being scheduled. Appointment scheduled for 03/06/16 at 1400 with Dr. Julien Nordmann. AVS updated. Tomi Bamberger, RN CM also updated.

## 2016-03-02 NOTE — Progress Notes (Signed)
Subjective: Coronary angiography performed yesterday without complications demonstrating severe diffuse disease. Few episodes of nonsustained vtach overnight but he denies noticing palpitations or chset pressure except when walking or feeling very anxious.  Objective: Vital signs in last 24 hours: Filed Vitals:   03/02/16 0450 03/02/16 0520 03/02/16 0834 03/02/16 1120  BP: 107/70  124/96 105/58  Pulse: 85  93 78  Temp: 98.2 F (36.8 C)   98.3 F (36.8 C)  TempSrc: Oral   Oral  Resp: 18   18  Height:      Weight:  75.887 kg (167 lb 4.8 oz)    SpO2: 98%   97%   Weight change: -4.113 kg (-9 lb 1.1 oz)  Intake/Output Summary (Last 24 hours) at 03/02/16 1212 Last data filed at 03/01/16 2240  Gross per 24 hour  Intake      3 ml  Output      0 ml  Net      3 ml   GENERAL- alert, co-operative, NAD EXTREMITIES- 2+ pitting edema in legs bilaterally from below knee, swan neck deformities in hands SKIN- Warm, dry, rash on extensor surface on arms PSYCH- Anxious about hospitalization and his health, appropriate thought content and speech.  Lab Results: Basic Metabolic Panel:  Recent Labs Lab 03/01/16 0606 03/01/16 1623 03/02/16 0515  NA 138  --  135  K 4.1  --  4.0  CL 102  --  97*  CO2 27  --  31  GLUCOSE 219*  --  300*  BUN 18  --  20  CREATININE 1.12 1.10 1.33*  CALCIUM 8.7*  --  8.4*   CBG:  Recent Labs Lab 03/01/16 0547 03/01/16 0803 03/01/16 1703 03/01/16 2147 03/02/16 0726 03/02/16 1117  GLUCAP 223* 242* 235* 194* 212* 151*    Studies/Results: No results found. Medications: I have reviewed the patient's current medications. Scheduled Meds: . aspirin EC  81 mg Oral Daily  . atorvastatin  40 mg Oral q1800  . carvedilol  6.25 mg Oral BID WC  . enoxaparin (LOVENOX) injection  40 mg Subcutaneous Q24H  . furosemide  40 mg Intravenous Q12H  . insulin aspart  0-15 Units Subcutaneous TID WC  . insulin glargine  10 Units Subcutaneous QHS  . isosorbide  mononitrate  60 mg Oral Daily  . living well with diabetes book   Does not apply Once  . pantoprazole  40 mg Oral Daily  . sodium chloride flush  3 mL Intravenous Q12H  . sodium chloride flush  3 mL Intravenous Q12H   Continuous Infusions:   PRN Meds:. Assessment/Plan: Volume overload: Mild diuresis, weight ?down today. Much more aggressive treatment may be limited due to blood pressure in the 100s-120s. We will continue titrating medications in house for a few more days most likely. Will discuss if Heart Failure team input would be beneficial in this patient with Cardiology service today. -Continue lasix 40mg  IV BID -Cardiac monitoring -I/Os not highly accurate so far, daily weights -Repeat Bmet daily with diuresis -Consult to cardiology, recs appreciated  CAD: Heart cath yesterday showed occlusion of 2/5 grafts and stenosis of both native vessels and grafts. Very small, poor distal targets are not amenable to PCI. Increase in SCr today is very early to be attributable to his recent IV contrast dose with heart cath. Will hold lisinopril today in setting of AKI -ASA 81mg  -Increase Coreg 6.5mg  BID -Increase Atorvastatin 40mg  -Add Imdur 60mg  -Hold lisinopril  T2DM, uncontrolled: Started lantus last night  and increased supplemental insulin. CBGs remain above his goal overnight/this morning so will titrate basal insulin regiment up again today. -Increase Lantus to 10U -SSI-M  Diet: Carb mod VTE ppx: [place] enoxaparin FULL CODE  Dispo: Disposition is deferred at this time, awaiting improvement of current medical problems.   The patient does not have a current PCP (No Pcp Per Patient) and does need an [facility] hospital follow-up appointment after discharge.  The patient does not have transportation limitations that hinder transportation to clinic appointments.   LOS: 4 days   Fuller Plan, MD 03/02/2016, 12:12 PM

## 2016-03-02 NOTE — Progress Notes (Signed)
Patient had 4 beats V-tach at 12:04. Patient is asymptomatic. Will continue to monitor.

## 2016-03-02 NOTE — Progress Notes (Addendum)
       Patient Name: Travis Tran Date of Encounter: 03/02/2016    SUBJECTIVE: Patient is awake and feeling better. His mate is also present in the room with him. He still has cough. Dyspnea has improved. He occasionally has tightness in the chest.  TELEMETRY:  Normal sinus rhythm with some nonsustained VT Filed Vitals:   03/01/16 2341 03/02/16 0450 03/02/16 0520 03/02/16 0834  BP: 106/66 107/70  124/96  Pulse: 89 85  93  Temp: 98.6 F (37 C) 98.2 F (36.8 C)    TempSrc: Oral Oral    Resp: 18 18    Height:      Weight:   167 lb 4.8 oz (75.887 kg)   SpO2: 94% 98%      Intake/Output Summary (Last 24 hours) at 03/02/16 0916 Last data filed at 03/01/16 2240  Gross per 24 hour  Intake      3 ml  Output      0 ml  Net      3 ml   LABS: Basic Metabolic Panel:  Recent Labs  99/37/16 0606 03/01/16 1623 03/02/16 0515  NA 138  --  135  K 4.1  --  4.0  CL 102  --  97*  CO2 27  --  31  GLUCOSE 219*  --  300*  BUN 18  --  20  CREATININE 1.12 1.10 1.33*  CALCIUM 8.7*  --  8.4*   CBC:  Recent Labs  03/01/16 0606 03/01/16 1623  WBC 8.0 6.9  HGB 14.1 13.6  HCT 45.9 43.3  MCV 83.2 83.3  PLT 224 178   Cardiac Enzymes:  Recent Labs  02/28/16 1137  CKTOTAL 131  TROPONINI 0.06*   Coronary Angiography 03/01/16: Diagnostic Diagram          Radiology/Studies:  No new data  Physical Exam: Blood pressure 124/96, pulse 93, temperature 98.2 F (36.8 C), temperature source Oral, resp. rate 18, height 5\' 6"  (1.676 m), weight 167 lb 4.8 oz (75.887 kg), SpO2 98 %. Weight change: -9 lb 1.1 oz (-4.113 kg)  Wt Readings from Last 3 Encounters:  03/02/16 167 lb 4.8 oz (75.887 kg)   Pale-appearing Soft S3 gallop Trace bilateral lower extremity edema Elevated neck veins  ASSESSMENT:  1. Severe bypass graft and native coronary disease without revascularization options. Patient has diffuse small vessel CAD. Widely patent RIMA to LAD followed by diffuse threadlike  native LAD; widely patent saphenous vein graft to the right coronary with large but diffusely diseased distal vessels 2. Ischemic cardiomyopathy with acute on chronic systolic heart failure 3. Acute kidney injury following contrast exposure in setting of nephrotic syndrome and poorly controlled diabetes 4. Medication noncompliance  Plan:  1. Monitor kidney function at least until tomorrow which will be 48 hours post contrast exposure 2. Hold ACE inhibitor therapy. 3. Further uptitrate heart failure therapy, increase intensity of beta blocker therapy. If kidney function continues to rise, may need to temporarily use combination of long-acting nitrate and hydralazine. Ultimately needs ACE inhibitor therapy because of nephrotic range proteinuria. 4. Plan is for aggressive heart failure therapy with reassessment of LV function in 90 days. If EF still less than 35% AICD will be necessary. 5. If LVEF remains low May eventually require advanced heart failure therapies. 6. Overall guarded prognosis. 7. Aggressive control of diabetes. 8. Add long-acting nitrate  Signed, 03/04/16 03/02/2016, 9:16 AM

## 2016-03-03 DIAGNOSIS — I255 Ischemic cardiomyopathy: Secondary | ICD-10-CM

## 2016-03-03 DIAGNOSIS — N189 Chronic kidney disease, unspecified: Secondary | ICD-10-CM

## 2016-03-03 DIAGNOSIS — R0602 Shortness of breath: Secondary | ICD-10-CM | POA: Insufficient documentation

## 2016-03-03 DIAGNOSIS — E1122 Type 2 diabetes mellitus with diabetic chronic kidney disease: Secondary | ICD-10-CM

## 2016-03-03 DIAGNOSIS — I5023 Acute on chronic systolic (congestive) heart failure: Secondary | ICD-10-CM

## 2016-03-03 DIAGNOSIS — Z9119 Patient's noncompliance with other medical treatment and regimen: Secondary | ICD-10-CM

## 2016-03-03 LAB — BASIC METABOLIC PANEL
Anion gap: 6 (ref 5–15)
BUN: 19 mg/dL (ref 6–20)
CHLORIDE: 101 mmol/L (ref 101–111)
CO2: 31 mmol/L (ref 22–32)
Calcium: 8.3 mg/dL — ABNORMAL LOW (ref 8.9–10.3)
Creatinine, Ser: 1.13 mg/dL (ref 0.61–1.24)
GFR calc Af Amer: >60 mL/min (ref >60)
GFR calc non Af Amer: >60 mL/min (ref >60)
GLUCOSE: 203 mg/dL — AB (ref 65–99)
POTASSIUM: 3.6 mmol/L (ref 3.5–5.1)
Sodium: 138 mmol/L (ref 135–145)

## 2016-03-03 LAB — GLUCOSE, CAPILLARY
GLUCOSE-CAPILLARY: 191 mg/dL — AB (ref 65–99)
Glucose-Capillary: 143 mg/dL — ABNORMAL HIGH (ref 65–99)
Glucose-Capillary: 134 mg/dL — ABNORMAL HIGH (ref 65–99)
Glucose-Capillary: 184 mg/dL — ABNORMAL HIGH (ref 65–99)

## 2016-03-03 MED ORDER — ATORVASTATIN CALCIUM 80 MG PO TABS
80.0000 mg | ORAL_TABLET | Freq: Every day | ORAL | Status: DC
Start: 2016-03-03 — End: 2016-03-04
  Administered 2016-03-03: 80 mg via ORAL
  Filled 2016-03-03: qty 1

## 2016-03-03 MED ORDER — LISINOPRIL 2.5 MG PO TABS
2.5000 mg | ORAL_TABLET | Freq: Every day | ORAL | Status: DC
  Administered 2016-03-03 – 2016-03-04 (×2): 2.5 mg via ORAL
  Filled 2016-03-03 (×2): qty 1

## 2016-03-03 MED ORDER — FUROSEMIDE 40 MG PO TABS
40.0000 mg | ORAL_TABLET | Freq: Two times a day (BID) | ORAL | Status: DC
  Administered 2016-03-03 – 2016-03-04 (×2): 40 mg via ORAL
  Filled 2016-03-03 (×2): qty 1

## 2016-03-03 NOTE — Progress Notes (Signed)
       Patient Name: Travis Tran Date of Encounter: 03/03/2016    SUBJECTIVE: Pt denies CP or dyspnea  TELEMETRY:  Normal sinus rhythm with 3 beats nonsustained VT Filed Vitals:   03/03/16 0800 03/03/16 0803 03/03/16 0805 03/03/16 0948  BP: 111/75 111/78 111/75 112/68  Pulse: 88 88 88 80  Temp:   98.4 F (36.9 C)   TempSrc:   Oral   Resp: 18  18   Height:      Weight:      SpO2: 98%  98%     Intake/Output Summary (Last 24 hours) at 03/03/16 0952 Last data filed at 03/03/16 0818  Gross per 24 hour  Intake    290 ml  Output   1950 ml  Net  -1660 ml   LABS: Basic Metabolic Panel:  Recent Labs  16/10/96 0515 03/03/16 0504  NA 135 138  K 4.0 3.6  CL 97* 101  CO2 31 31  GLUCOSE 300* 203*  BUN 20 19  CREATININE 1.33* 1.13  CALCIUM 8.4* 8.3*   CBC:  Recent Labs  03/01/16 0606 03/01/16 1623  WBC 8.0 6.9  HGB 14.1 13.6  HCT 45.9 43.3  MCV 83.2 83.3  PLT 224 178  Coronary Angiography 03/01/16: Diagnostic Diagram           Physical Exam: Blood pressure 112/68, pulse 80, temperature 98.4 F (36.9 C), temperature source Oral, resp. rate 18, height 5\' 6"  (1.676 m), weight 162 lb 1.6 oz (73.528 kg), SpO2 98 %. Weight change: -5 lb 3.2 oz (-2.359 kg)  Wt Readings from Last 3 Encounters:  03/03/16 162 lb 1.6 oz (73.528 kg)   WD WN NAD HEENT-normal Neck Supple Chest CTA CV RRR Abd soft Ext trace edema Neuro grossly intact  ASSESSMENT:  1. Severe bypass graft and native coronary disease without revascularization options. Patient has diffuse small vessel CAD. Widely patent RIMA to LAD followed by diffuse threadlike native LAD; widely patent saphenous vein graft to the right coronary with large but diffusely diseased distal vessels 2. Ischemic cardiomyopathy with acute on chronic systolic heart failure 3. Acute kidney injury following contrast exposure in setting of nephrotic syndrome and poorly controlled diabetes 4. Medication  noncompliance  Plan: Patient is much improved. Plan to continue aspirin and increase Lipitor to 80 mg daily for coronary artery disease. He will need lipids and liver 4 weeks later. His volume status is much improved. Change Lasix to 40 mg twice a day and follow renal function. Spironolactone can be added as an outpatient. Continue carvedilol for ischemic cardiomyopathy. Renal function is stable. Add lisinopril 2.5 mg daily. Discontinue nitrates. Titrate medications as an outpatient. Once fully titrated repeat echocardiogram and if ejection fraction less than 35% would need ICD. Possible discharge tomorrow morning if stable.  05/03/16 03/03/2016, 9:52 AM

## 2016-03-03 NOTE — Progress Notes (Signed)
Subjective: No acute overnight events. Patient states he is comfortable and not having any dyspnea. No other complaints.   Objective: Vital signs in last 24 hours: Filed Vitals:   03/03/16 0803 03/03/16 0805 03/03/16 0948 03/03/16 1021  BP: 111/78 111/75 112/68 112/68  Pulse: 88 88 80   Temp:  98.4 F (36.9 C)    TempSrc:  Oral    Resp:  18    Height:      Weight:      SpO2:  98%     Weight change: -5 lb 3.2 oz (-2.359 kg)  Intake/Output Summary (Last 24 hours) at 03/03/16 1104 Last data filed at 03/03/16 0818  Gross per 24 hour  Intake    290 ml  Output   1950 ml  Net  -1660 ml   GENERAL- alert, co-operative, NAD CARDIOVASCULAR: regular rate and rhythm. No murmurs, rubs, or gallop appreciated.  PULMONARY: lungs CTAB. No wheezes, rales, or rhonchi.  ABDOMEN- soft, NT, ND, +BS EXTREMITIES- Trace pitting edema in legs bilaterally from below knee SKIN- Warm, dry  Lab Results: Basic Metabolic Panel:  Recent Labs Lab 03/02/16 0515 03/03/16 0504  NA 135 138  K 4.0 3.6  CL 97* 101  CO2 31 31  GLUCOSE 300* 203*  BUN 20 19  CREATININE 1.33* 1.13  CALCIUM 8.4* 8.3*   CBG:  Recent Labs Lab 03/01/16 2147 03/02/16 0726 03/02/16 1117 03/02/16 1623 03/02/16 2105 03/03/16 0743  GLUCAP 194* 212* 151* 173* 104* 143*    Studies/Results: No results found. Medications: I have reviewed the patient's current medications. Scheduled Meds: . aspirin EC  81 mg Oral Daily  . atorvastatin  80 mg Oral q1800  . carvedilol  6.25 mg Oral BID WC  . enoxaparin (LOVENOX) injection  40 mg Subcutaneous Q24H  . furosemide  40 mg Oral BID  . insulin aspart  0-15 Units Subcutaneous TID WC  . insulin glargine  10 Units Subcutaneous QHS  . lisinopril  2.5 mg Oral Daily  . living well with diabetes book   Does not apply Once  . pantoprazole  40 mg Oral Daily  . sodium chloride flush  3 mL Intravenous Q12H  . sodium chloride flush  3 mL Intravenous Q12H   Continuous Infusions:   PRN Meds:. Assessment/Plan: Volume overload: Net output -0.9 L in the past 24 hrs. Weight down by 21 lbs since admission and 5 lbs in the past 24 hrs. Lungs clear on exam and patient has trace pitting edema of bilateral lower extremities. We will continue titrating medications during this hospitalization and patient will need titration of medications as outpatient.  -Cardiology following, appreciate recs  -D/c IV Lasix.  -Give Lasix 40mg  po BID -Spirinolactone can be added as outpatient  -Cardiac monitoring -I/Os not highly accurate so far, daily weights -Repeat Bmet daily with diuresis  CAD: Heart cath yesterday showed occlusion of 2/5 grafts and stenosis of both native vessels and grafts. Very small, poor distal targets are not amenable to PCI.    -ASA 81mg  -Coreg 6.5mg  BID for ischemic cardiomyopathy  -Add Lisinopril 2.5 mg daily  -Increase Atorvastatin to 80mg  daily -D/c Imdur -Will need lipid panel and LFTs in 4 months  -repeat echo as outpatient, if EF <35%, would need ICD  AKI SCr up to 1.3 yesterday, today it is back down to baseline (1.1). -Continue to monitor renal function with daily BMET  T2DM, uncontrolled: CBG 143 this morning. -Lantus 10U qhs -SSI-M  Diet: Carb mod VTE  ppx: Joanna enoxaparin FULL CODE  Dispo: Disposition is deferred at this time, awaiting improvement of current medical problems.   The patient does not have a current PCP (No Pcp Per Patient) and does need an [facility] hospital follow-up appointment after discharge.  The patient does not have transportation limitations that hinder transportation to clinic appointments.   LOS: 5 days   John Giovanni, MD 03/03/2016, 11:04 AM

## 2016-03-04 ENCOUNTER — Other Ambulatory Visit: Payer: Self-pay | Admitting: Internal Medicine

## 2016-03-04 ENCOUNTER — Telehealth: Payer: Self-pay | Admitting: Internal Medicine

## 2016-03-04 LAB — BASIC METABOLIC PANEL
Anion gap: 7 (ref 5–15)
BUN: 21 mg/dL — ABNORMAL HIGH (ref 6–20)
CHLORIDE: 97 mmol/L — AB (ref 101–111)
CO2: 31 mmol/L (ref 22–32)
CREATININE: 1.23 mg/dL (ref 0.61–1.24)
Calcium: 8.4 mg/dL — ABNORMAL LOW (ref 8.9–10.3)
GFR calc Af Amer: >60 mL/min (ref >60)
GLUCOSE: 299 mg/dL — AB (ref 65–99)
Potassium: 3.9 mmol/L (ref 3.5–5.1)
SODIUM: 135 mmol/L (ref 135–145)

## 2016-03-04 LAB — GLUCOSE, CAPILLARY
GLUCOSE-CAPILLARY: 254 mg/dL — AB (ref 65–99)
Glucose-Capillary: 135 mg/dL — ABNORMAL HIGH (ref 65–99)

## 2016-03-04 MED ORDER — FUROSEMIDE 40 MG PO TABS: 40.0000 mg | ORAL_TABLET | Freq: Two times a day (BID) | ORAL | Status: DC

## 2016-03-04 MED ORDER — "INSULIN SYRINGE 29G X 1/2"" 0.5 ML MISC": Status: DC

## 2016-03-04 MED ORDER — PANTOPRAZOLE SODIUM 40 MG PO TBEC: 40.0000 mg | DELAYED_RELEASE_TABLET | Freq: Every day | ORAL | Status: DC

## 2016-03-04 MED ORDER — ATORVASTATIN CALCIUM 80 MG PO TABS: 80.0000 mg | ORAL_TABLET | Freq: Every day | ORAL | Status: DC

## 2016-03-04 MED ORDER — ASPIRIN 81 MG PO TBEC: 81.0000 mg | DELAYED_RELEASE_TABLET | Freq: Every day | ORAL | Status: DC

## 2016-03-04 MED ORDER — INSULIN SYRINGE 29G X 1/2" 0.5 ML MISC
Status: DC
Start: 2016-03-04 — End: 2016-03-04

## 2016-03-04 MED ORDER — LISINOPRIL 2.5 MG PO TABS: 2.5000 mg | ORAL_TABLET | Freq: Every day | ORAL | Status: DC

## 2016-03-04 MED ORDER — INSULIN ASPART 100 UNIT/ML ~~LOC~~ SOLN: 3.0000 [IU] | Freq: Three times a day (TID) | SUBCUTANEOUS | Status: DC

## 2016-03-04 MED ORDER — INSULIN GLARGINE 100 UNIT/ML ~~LOC~~ SOLN: 15.0000 [IU] | Freq: Every day | SUBCUTANEOUS | Status: DC

## 2016-03-04 MED ORDER — CARVEDILOL 6.25 MG PO TABS: 6.2500 mg | ORAL_TABLET | Freq: Two times a day (BID) | ORAL | Status: DC

## 2016-03-04 MED ORDER — CARVEDILOL 6.25 MG PO TABS
6.2500 mg | ORAL_TABLET | Freq: Two times a day (BID) | ORAL | Status: DC
Start: 2016-03-04 — End: 2016-07-31

## 2016-03-04 NOTE — Discharge Summary (Signed)
Name: Travis Tran MRN: [id number] DOB: January 17, 1956 60 y.o. PCP: No Pcp Per Patient  Date of Admission: 02/27/2016  7:33 PM Date of Discharge: 03/04/2016 Attending Physician: Dr. Cyndie Chime  Discharge Diagnosis: Principal Problem: Volume Overload 2/2 Systolic Heart Failure Active Problems: CAD AKI Uncontrolled Type 2 DM   Discharge Medications:   Medication List    TAKE these medications        aspirin 81 MG EC tablet  Take 1 tablet (81 mg total) by mouth daily.     atorvastatin 80 MG tablet  Commonly known as:  LIPITOR  Take 1 tablet (80 mg total) by mouth daily at 6 PM.     carvedilol 6.25 MG tablet  Commonly known as:  COREG  Take 1 tablet (6.25 mg total) by mouth 2 (two) times daily with a meal.     furosemide 40 MG tablet  Commonly known as:  LASIX  Take 1 tablet (40 mg total) by mouth 2 (two) times daily.     insulin aspart 100 UNIT/ML injection  Commonly known as:  novoLOG  Inject 3 Units into the skin 3 (three) times daily with meals.     insulin glargine 100 UNIT/ML injection  Commonly known as:  LANTUS  Inject 0.15 mLs (15 Units total) into the skin at bedtime.     INSULIN SYRINGE .5CC/29G 29G X 1/2" 0.5 ML Misc  Use one syringe per insulin injection.     lisinopril 2.5 MG tablet  Commonly known as:  PRINIVIL,ZESTRIL  Take 1 tablet (2.5 mg total) by mouth daily.     pantoprazole 40 MG tablet  Commonly known as:  PROTONIX  Take 1 tablet (40 mg total) by mouth daily.        Disposition and follow-up:   [name] was discharged from [facility] in Good condition.  At the hospital follow up visit please address:  1. Volume Overload 2/2 HF: EF found to be 10%. Discharge weight 158 lbs. Discharged on Lasix 40 mg BID. Check that weight is stable.  CAD: Significant bypass graft blockages and CAD in native vessels. Discharged on ASA, Lipitor, Coreg, Lisinopril. Ensure he was able to get meds and taking them. Type 2 DM:  Discharged on Lantus 15 units QHS and Novolog 3 units TID with meals. He was also prescribed a glucometer and test strips and advised to check TID w/meals and at bedtime. Please assess his blood sugars and adjust his insulin needs as necessary. AKI: Cr 1.5 on admission, back to BL 1.1-1.2 by time of discharge. Needs repeat bmet in 1 week, especially after starting lisinopril.  2.  Labs / imaging needed at time of follow-up: bmet in 1 week, lipid panel and LFTs in 4 months, repeat Echo in 3 months  3.  Pending labs/ test needing follow-up: None   Follow-up Appointments: Follow-up Information    Follow up with [facility]. Go on 03/06/2016.   Why:  at 2:00 pm. Please bring your photo ID. You may fill your prescriptions here after discharge   Contact information:   [address] [place] [address] [phone]      Follow up with Robbie Lis, PA-C On 03/09/2016.   Specialties:  Cardiology, Radiology   Why:  See provider at 2:30 pm, please arrive 15 minutes early for paperwork.    Contact information:   [address] [place] [address] [phone]       Discharge  Instructions: Discharge Instructions    Diet - low sodium heart healthy    Complete by:  As directed      Increase activity slowly    Complete by:  As directed            Consultations: Treatment Team:  Rounding Lbcardiology, MD  Procedures Performed:  Dg Chest 2 View  02/27/2016  CLINICAL DATA:  Shortness of breath.  CHF.  Situs inversus. EXAM: CHEST  2 VIEW COMPARISON:  None. FINDINGS: Sternotomy wires are aligned. There are discontinuities of the 2 lower most sternotomy wires. CABG clips overlie the mediastinum. There is dextrocardia with mild cardiomegaly. The mediastinal contour otherwise appears normal. No pneumothorax. Trace bilateral pleural effusions. Borderline mild pulmonary edema. Curvilinear opacities at the right lung base. IMPRESSION:  1. Dextrocardia with mild cardiomegaly and borderline mild pulmonary edema, suggesting mild congestive heart failure. 2. Trace bilateral pleural effusions. 3. Curvilinear opacities at the right lung base, favor mild scarring or atelectasis. Electronically Signed   By: Delbert Phenix M.D.   On: 02/27/2016 21:05    2D Echo:  02/28/16 Study Conclusions - Left ventricle: The cavity size was normal. There was mild  concentric hypertrophy. Systolic function was normal. The  estimated ejection fraction was 10%. Severe, diffuse hypokinesis  with akinesis of the apex mid-apical inferolateral and inferior  walls. Doppler parameters are consistent with a reversible  restrictive pattern, indicative of decreased left ventricular  diastolic compliance and/or increased left atrial pressure (grade  3 diastolic dysfunction). Doppler parameters are consistent with  high ventricular filling pressure. - Aortic valve: Transvalvular velocity was within the normal range.  There was no stenosis. There was no regurgitation. - Mitral valve: Calcified annulus. There was mild regurgitation. - Left atrium: The atrium was moderately dilated. - Right ventricle: The cavity size was normal. Wall thickness was  normal. Systolic function was severely reduced. - Atrial septum: No defect or patent foramen ovale was identified  by color flow Doppler. - Tricuspid valve: There was mild regurgitation. - Pulmonary arteries: Systolic pressure was within the normal  range. PA peak pressure: 17 mm Hg (S). - Inferior vena cava: The vessel was normal in size. The  respirophasic diameter changes were in the normal range (>= 50%).  Right/Left Cardiac Cath:  03/01/16 1. Complete dextrocardia - images viewed are 100% reversed from true image 2. Severe diffuse native coronary artery disease in dextrocardia configuration 3. Ost RCA lesion, 80% stenosed. Prox RCA lesion, 80% stenosed. Mid RCA to Dist RCA lesion, 100%  stenosed. 4. Native LCA severely diseased. Ost LM to LM lesion, 45% stenosed. Ost LAD lesion, 90% stenosed. Mid LAD lesion, 100% stenosed. Prox Cx lesion, 99% stenosed. 1st Mrg lesion, 99% stenosed. Mid Cx lesion, 100% stenosed. 5. SVG-OM & SVG-Diag unable to visualized. Presumed to be 100% occluded 6. Patent RIMA-LAD with diffuse disease in small caliber target LAD. Dist LAD-1 lesion, 95% stenosed. Dist LAD-2 lesion, 70% stenosed. 7. Sequential SVG-rPDA-RPAV is large, and is anatomically normal. 2nd RPLB lesion, 90% stenosed. 8. There is severe left ventricular systolic dysfunction. Dilated cardiomyopathy with elevated LVEDP and secondary pulmonary hypertension  Patient has severe ischemic cardiomyopathy with 2/5 grafts 100% occluded. Severe native coronary disease. Widely patent graft to the right system that is a sequential graft. There is some downstream disease in one of the PL branches that is not reachable by PCI.  The RIMA-LAD is widely patent, however the distal LAD is diffusely diseased and likely too small for PCI.  Admission  HPI: Mr. Dec is a 60 year old male with a past medical history of CAD s/p 5 vessel CABG, uncontrolled DM type II, situs inversus, rheumatoid and psoriatic arthritis, HLD, asthma presenting to the ED today with complaints of lower extremity, abdominal and scrotal swelling. Patient reports that he has had bilateral lower extremity edema for the past 2-3 months that is worse at night after being on his feet all day and improved overnight when he had his leg elevated. Approximately 2-3 weeks ago he had a URI illness and has reports progressive worsening of his symptoms since that time. He reports he has no dyspnea with exertion at baseline but over the past several weeks he has become more short of breath with exertion to the point that today he cannot walk 5 feet without become short of breath. He has also had worsening of his over this time period as well. He reports that  he has had bilateral lower extremity edema, abdominal edema and scrotal edema. Reports the leg edema has been persistent the past 2 weeks with the abdominal edema and scrotal edema waxing and waning. He reports a history of 4 pillow orthopnea that has been unchanged for several years as well as PND. He does report substernal right sided (situs inversus) chest pressure for the past 2 weeks. He reports this pain occurs with any exertion and resolves at rest.  Patient reports no history of CHF, MI, cirrhosis or kidney disease. Does have significant CAD with 5 vessel CABG following cath for unstable angina that showed critical coronary artery disease in 09/2009. He reports he has not seen a physician in several years and currently only takes ibuprofen for arthritis.   Social: Denies any smoking history, alcohol use. Does admit to [facility] use. Family Hx: Reports father died in his early 69s due to MI  Hospital Course by problem list:  Volume Overload 2/2 Systolic Heart Failure: Patient presented with significant lower extremity, abdominal, and scrotal swelling that had been worsening over the last 2-3 months as well as dyspnea on exertion. He was found to have an EF of 10% on echo. Cardiac cath revealed significant CAD that was not amenable to PCI, therefore making his cardiomyopathy ischemic in nature. He was diuresed with IV Lasix and then transitioned to PO Lasix. His discharge weight was 158 lbs, which was 24 lbs less than his admission weight. He will follow up with Cardiology and establish with [facility] for a PCP. He was discharged on Lasix 40 mg BID.   CAD: He presented with right-sided chest pain/pressure that was relieved with rest. He has a history of severe CAD requiring 5 vessel CABG in 2010. A R/L cardiac cath was performed that revealed occlusion of 2/5 grafts and stenosis of both native vessels and grafts. He did not have any targets amenable to PCI. He was medically optimized with Coreg 6.25 mg BID,  ASA, Lisinopril 2.5 mg daily, and Atorvastatin 80 mg daily before discharge. He will need repeat lipids and LFTs in 4 months. He will follow up with Cardiology as an outpatient and likely be started on Spironolactone. He will also need a repeat Echo in 3 months and if EF < 35% will need an ICD.  AKI: Cr 1.5 on admission with baseline of 1.1-1.2. His Cr improved after IV diuresis and was back to baseline by time of discharge. He will need a repeat bmet in 1 week, especially with starting lisinopril.    Uncontrolled Type 2 DM: HbA1c 15.1 on  admission. He was not on any medications prior to hospitalization. His blood sugars were adequately controlled in the hospital. He was discharged on Lantus 15 units QHS and Novolog 3 units TID with meals. He was also prescribed an Accu-check meter with test strips. He will need close follow up with his PCP to achieve better diabetes control.   Discharge Vitals:   BP 136/85 mmHg  Pulse 90  Temp(Src) 98 F (36.7 C) (Oral)  Resp 18  Ht 5\' 6"  (1.676 m)  Wt 158 lb 9.6 oz (71.94 kg)  BMI 25.61 kg/m2  SpO2 97% General: alert, sitting up in bed, NAD CV: RRR, no m/g/r Pulm: CTA bilaterally, breaths non-labored  Abd: BS+, soft, non-tender  Ext: no peripheral edema   Discharge Labs:  Results for orders placed or performed during the hospital encounter of 02/27/16 (from the past 24 hour(s))  Glucose, capillary     Status: Abnormal   Collection Time: 03/03/16  8:55 PM  Result Value Ref Range   Glucose-Capillary 191 (H) 65 - 99 mg/dL  Basic metabolic panel     Status: Abnormal   Collection Time: 03/04/16  6:10 AM  Result Value Ref Range   Sodium 135 135 - 145 mmol/L   Potassium 3.9 3.5 - 5.1 mmol/L   Chloride 97 (L) 101 - 111 mmol/L   CO2 31 22 - 32 mmol/L   Glucose, Bld 299 (H) 65 - 99 mg/dL   BUN 21 (H) 6 - 20 mg/dL   Creatinine, Ser 5.09 0.61 - 1.24 mg/dL   Calcium 8.4 (L) 8.9 - 10.3 mg/dL   GFR calc non Af Amer >60 >60 mL/min   GFR calc Af Amer >60  >60 mL/min   Anion gap 7 5 - 15  Glucose, capillary     Status: Abnormal   Collection Time: 03/04/16  7:40 AM  Result Value Ref Range   Glucose-Capillary 254 (H) 65 - 99 mg/dL  Glucose, capillary     Status: Abnormal   Collection Time: 03/04/16 11:40 AM  Result Value Ref Range   Glucose-Capillary 135 (H) 65 - 99 mg/dL    Signed: Su Hoff, MD 03/04/2016, 5:07 PM    Services Ordered on Discharge: None Equipment Ordered on Discharge: None

## 2016-03-04 NOTE — Discharge Planning (Signed)
Pt being discharge to home.Pt given 30 day Rx. Pt vital signs stable. Pt denies any pain. Discharged instructions reviewed with Pt.

## 2016-03-04 NOTE — Progress Notes (Signed)
CM met with pt and gave pt [place] letter and list of participating pharmacies.  Pt verbalized understanding of all MATCH parameters.  Pt can purchase a ReLion glucometer OTC and strips for <20.00. NO other CM needs were communicated.

## 2016-03-04 NOTE — Progress Notes (Signed)
       Patient Name: Travis Tran Date of Encounter: 03/04/2016    SUBJECTIVE: Pt denies CP or dyspnea  TELEMETRY:  Normal sinus rhythm with 3 beats nonsustained VT Filed Vitals:   03/03/16 1021 03/03/16 1517 03/03/16 2100 03/04/16 0500  BP: 112/68 104/67 126/78 136/85  Pulse:  87 88 90  Temp:  98 F (36.7 C) 97.8 F (36.6 C) 98 F (36.7 C)  TempSrc:  Oral Oral Oral  Resp:   18 18  Height:      Weight:    158 lb 9.6 oz (71.94 kg)  SpO2:  98% 99% 97%    Intake/Output Summary (Last 24 hours) at 03/04/16 0826 Last data filed at 03/03/16 1800  Gross per 24 hour  Intake    480 ml  Output    400 ml  Net     80 ml   LABS: Basic Metabolic Panel:  Recent Labs  21/11/73 0504 03/04/16 0610  NA 138 135  K 3.6 3.9  CL 101 97*  CO2 31 31  GLUCOSE 203* 299*  BUN 19 21*  CREATININE 1.13 1.23  CALCIUM 8.3* 8.4*   CBC:  Recent Labs  03/01/16 1623  WBC 6.9  HGB 13.6  HCT 43.3  MCV 83.3  PLT 178  Coronary Angiography 03/01/16: Diagnostic Diagram           Physical Exam: Blood pressure 136/85, pulse 90, temperature 98 F (36.7 C), temperature source Oral, resp. rate 18, height 5\' 6"  (1.676 m), weight 158 lb 9.6 oz (71.94 kg), SpO2 97 %. Weight change: -3 lb 8 oz (-1.588 kg)  Wt Readings from Last 3 Encounters:  03/04/16 158 lb 9.6 oz (71.94 kg)   WD WN NAD HEENT-normal Neck Supple Chest CTA CV RRR Abd soft Ext no edema Neuro grossly intact  ASSESSMENT:  1. Severe bypass graft and native coronary disease without revascularization options. Patient has diffuse small vessel CAD. Widely patent RIMA to LAD followed by diffuse threadlike native LAD; widely patent saphenous vein graft to the right coronary with large but diffusely diseased distal vessels 2. Ischemic cardiomyopathy with acute on chronic systolic heart failure 3. Acute kidney injury following contrast exposure in setting of nephrotic syndrome and poorly controlled diabetes 4. Medication  noncompliance  Plan: Patient is much improved. Plan to continue aspirin and statin for coronary artery disease. He will need lipids and liver checked in 4 weeks. His volume status is much improved. Continue Lasix 40 mg BID; check BMET one week following DC. Spironolactone can be added as an outpatient. Continue carvedilol and lisinopril for ischemic cardiomyopathy. Titrate medications as an outpatient. Once fully titrated repeat echocardiogram and if ejection fraction less than 35% would need ICD. PT can be DCed today from a cardiac standpoint and FU Dr 05/04/16 or PA in one week.  Katrinka Blazing 03/04/2016, 8:26 AM

## 2016-03-04 NOTE — Telephone Encounter (Signed)
Patient needs [facility] brand test strips. Will order for him.

## 2016-03-04 NOTE — Discharge Instructions (Signed)
It was a pleasure taking care of you, [name].  You were hospitalized for chest pain and found to have significant coronary artery disease and blockages of your previous bypass graft. You were also found to have heart failure.   You will need follow up with Cardiology and your new primary care physician at the [facility] and [facility]. Your new primary care doctor can help get you set up with Cardiology.  Please pick up your medications through the [facility] as soon as possible. I sent your prescriptions to the [facility].  It is very important that you take your medications.  Take care, Dr. Beckie Salts

## 2016-03-04 NOTE — Progress Notes (Signed)
Subjective: No acute overnight events. He reports feeling tired but otherwise doing well.   Objective: Vital signs in last 24 hours: Filed Vitals:   03/03/16 1021 03/03/16 1517 03/03/16 2100 03/04/16 0500  BP: 112/68 104/67 126/78 136/85  Pulse:  87 88 90  Temp:  98 F (36.7 C) 97.8 F (36.6 C) 98 F (36.7 C)  TempSrc:  Oral Oral Oral  Resp:   18 18  Height:      Weight:    158 lb 9.6 oz (71.94 kg)  SpO2:  98% 99% 97%   Weight change: -3 lb 8 oz (-1.588 kg)  Intake/Output Summary (Last 24 hours) at 03/04/16 1156 Last data filed at 03/04/16 1000  Gross per 24 hour  Intake    720 ml  Output    400 ml  Net    320 ml   General: alert, sitting up in bed, NAD CV: RRR, no m/g/r Pulm: CTA bilaterally, breaths non-labored  Abd: BS+, soft, non-tender  Ext: no peripheral edema   Lab Results: Basic Metabolic Panel:  Recent Labs Lab 03/03/16 0504 03/04/16 0610  NA 138 135  K 3.6 3.9  CL 101 97*  CO2 31 31  GLUCOSE 203* 299*  BUN 19 21*  CREATININE 1.13 1.23  CALCIUM 8.3* 8.4*   CBG:  Recent Labs Lab 03/02/16 2105 03/03/16 0743 03/03/16 1147 03/03/16 1614 03/03/16 2055 03/04/16 0740  GLUCAP 104* 143* 134* 184* 191* 254*   Medications: I have reviewed the patient's current medications. Scheduled Meds: . aspirin EC  81 mg Oral Daily  . atorvastatin  80 mg Oral q1800  . carvedilol  6.25 mg Oral BID WC  . enoxaparin (LOVENOX) injection  40 mg Subcutaneous Q24H  . furosemide  40 mg Oral BID  . insulin aspart  0-15 Units Subcutaneous TID WC  . insulin glargine  10 Units Subcutaneous QHS  . lisinopril  2.5 mg Oral Daily  . living well with diabetes book   Does not apply Once  . pantoprazole  40 mg Oral Daily  . sodium chloride flush  3 mL Intravenous Q12H  . sodium chloride flush  3 mL Intravenous Q12H   Continuous Infusions:   PRN Meds:. Assessment/Plan:  Volume overload 2/2 Systolic Heart Failure: Echo with EF 10%, grade 3 diastolic dysfunction. Net  output -1.35 L in the past 24 hrs. Weight down by 24 lbs since admission. Ok per cards standpoint to be discharged today. He will need close follow up with cardiology and [facility]  - Cardiology following, appreciate recs  - Continue Lasix 40mg  po BID - Spirinolactone can be added as outpatient  - Continue Coreg 6.25 mg BID. Can be uptitrated as outpatient.  - Continue Lisinopril 2.5 mg daily  - I/Os not highly accurate so far, daily weights  CAD: R/L heart cath on 3/30 showed occlusion of 2/5 grafts and stenosis of both native vessels and grafts. Very small, poor distal targets are not amenable to PCI. He will follow up with cardiology as outpatient and establish with a PCP.    - Continue ASA 81mg  - Continue Coreg 6.5mg  BID - Continue Lisinopril 2.5 mg daily  - Continue Atorvastatin 80mg  daily - Will need lipid panel and LFTs in 4 months  - Repeat echo as outpatient, if EF <35%, would need ICD  AKI: Cr now back to baseline of 1.1-1.2.  - Will need bmet as outpatient in 1 week  Uncontrolled Type 2 DM: Blood sugars in 130s-200s. HbA1c 15.1 on admission. I will increase his Lantus to 15 units and have him take 3 units TID with meals. He will need follow up with his PCP to optimize his diabetes. - Increase Lantus 15 units QHS - Continue moderate ISS here. Discharge on Novolog 3 units TID with meals. - Needs outpatient follow up  Diet: Carb mod VTE ppx:  enoxaparin FULL CODE Dispo: Discharge today  The patient does not have a current PCP (No Pcp Per Patient) and does need an [facility] follow-up appointment after discharge.  The patient does not have transportation limitations that hinder transportation to clinic appointments.   LOS: 6 days   Su Hoff, MD 03/04/2016, 11:56 AM

## 2016-03-06 ENCOUNTER — Encounter: Payer: Self-pay | Admitting: Internal Medicine

## 2016-03-06 ENCOUNTER — Ambulatory Visit: Payer: Medicaid Other | Attending: Internal Medicine | Admitting: Internal Medicine

## 2016-03-06 VITALS — BP 108/77 | HR 73 | Temp 98.0°F | Resp 16 | Ht 66.0 in | Wt 164.8 lb

## 2016-03-06 DIAGNOSIS — Z7982 Long term (current) use of aspirin: Secondary | ICD-10-CM | POA: Diagnosis not present

## 2016-03-06 DIAGNOSIS — E119 Type 2 diabetes mellitus without complications: Secondary | ICD-10-CM | POA: Insufficient documentation

## 2016-03-06 DIAGNOSIS — Z1211 Encounter for screening for malignant neoplasm of colon: Secondary | ICD-10-CM

## 2016-03-06 DIAGNOSIS — K219 Gastro-esophageal reflux disease without esophagitis: Secondary | ICD-10-CM | POA: Insufficient documentation

## 2016-03-06 DIAGNOSIS — Z79899 Other long term (current) drug therapy: Secondary | ICD-10-CM | POA: Diagnosis not present

## 2016-03-06 DIAGNOSIS — I25119 Atherosclerotic heart disease of native coronary artery with unspecified angina pectoris: Secondary | ICD-10-CM | POA: Diagnosis not present

## 2016-03-06 DIAGNOSIS — I429 Cardiomyopathy, unspecified: Secondary | ICD-10-CM | POA: Insufficient documentation

## 2016-03-06 DIAGNOSIS — J45909 Unspecified asthma, uncomplicated: Secondary | ICD-10-CM | POA: Diagnosis not present

## 2016-03-06 DIAGNOSIS — Z794 Long term (current) use of insulin: Secondary | ICD-10-CM | POA: Diagnosis not present

## 2016-03-06 DIAGNOSIS — E8770 Fluid overload, unspecified: Secondary | ICD-10-CM | POA: Diagnosis not present

## 2016-03-06 DIAGNOSIS — I10 Essential (primary) hypertension: Secondary | ICD-10-CM | POA: Diagnosis not present

## 2016-03-06 LAB — GLUCOSE, POCT (MANUAL RESULT ENTRY): POC Glucose: 125 mg/dl — AB (ref 70–99)

## 2016-03-06 MED ORDER — INSULIN ASPART 100 UNIT/ML ~~LOC~~ SOLN: 5.0000 [IU] | Freq: Three times a day (TID) | SUBCUTANEOUS | Status: DC

## 2016-03-06 NOTE — Progress Notes (Signed)
Patient here for follow up from the hospital Was admitted with CHF Patient has a history of DM and HTN

## 2016-03-06 NOTE — Patient Instructions (Addendum)
[facility] cardiology appt Friday.  Diabetes Mellitus and Food It is important for you to manage your blood sugar (glucose) level. Your blood glucose level can be greatly affected by what you eat. Eating healthier foods in the appropriate amounts throughout the day at about the same time each day will help you control your blood glucose level. It can also help slow or prevent worsening of your diabetes mellitus. Healthy eating may even help you improve the level of your blood pressure and reach or maintain a healthy weight.  General recommendations for healthful eating and cooking habits include:  Eating meals and snacks regularly. Avoid going long periods of time without eating to lose weight.  Eating a diet that consists mainly of plant-based foods, such as fruits, vegetables, nuts, legumes, and whole grains.  Using low-heat cooking methods, such as baking, instead of high-heat cooking methods, such as deep frying. Work with your dietitian to make sure you understand how to use the Nutrition Facts information on food labels. HOW CAN FOOD AFFECT ME? Carbohydrates Carbohydrates affect your blood glucose level more than any other type of food. Your dietitian will help you determine how many carbohydrates to eat at each meal and teach you how to count carbohydrates. Counting carbohydrates is important to keep your blood glucose at a healthy level, especially if you are using insulin or taking certain medicines for diabetes mellitus. Alcohol Alcohol can cause sudden decreases in blood glucose (hypoglycemia), especially if you use insulin or take certain medicines for diabetes mellitus. Hypoglycemia can be a life-threatening condition. Symptoms of hypoglycemia (sleepiness, dizziness, and disorientation) are similar to symptoms of having too much alcohol.  If your health care provider has given you approval to drink alcohol, do so in moderation and use the following guidelines:  Women should  not have more than one drink per day, and men should not have more than two drinks per day. One drink is equal to:  12 oz of beer.  5 oz of wine.  1 oz of hard liquor.  Do not drink on an empty stomach.  Keep yourself hydrated. Have water, diet soda, or unsweetened iced tea.  Regular soda, juice, and other mixers might contain a lot of carbohydrates and should be counted. WHAT FOODS ARE NOT RECOMMENDED? As you make food choices, it is important to remember that all foods are not the same. Some foods have fewer nutrients per serving than other foods, even though they might have the same number of calories or carbohydrates. It is difficult to get your body what it needs when you eat foods with fewer nutrients. Examples of foods that you should avoid that are high in calories and carbohydrates but low in nutrients include:  Trans fats (most processed foods list trans fats on the Nutrition Facts label).  Regular soda.  Juice.  Candy.  Sweets, such as cake, pie, doughnuts, and cookies.  Fried foods. WHAT FOODS CAN I EAT? Eat nutrient-rich foods, which will nourish your body and keep you healthy. The food you should eat also will depend on several factors, including:  The calories you need.  The medicines you take.  Your weight.  Your blood glucose level.  Your blood pressure level.  Your cholesterol level. You should eat a variety of foods, including:  Protein.  Lean cuts of meat.  Proteins low in saturated fats, such as fish, egg whites, and beans. Avoid processed meats.  Fruits and vegetables.  Fruits and vegetables that may help control blood glucose  levels, such as apples, mangoes, and yams.  Dairy products.  Choose fat-free or low-fat dairy products, such as milk, yogurt, and cheese.  Grains, bread, pasta, and rice.  Choose whole grain products, such as multigrain bread, whole oats, and brown rice. These foods may help control blood  pressure.  Fats.  Foods containing healthful fats, such as nuts, avocado, olive oil, canola oil, and fish. DOES EVERYONE WITH DIABETES MELLITUS HAVE THE SAME MEAL PLAN? Because every person with diabetes mellitus is different, there is not one meal plan that works for everyone. It is very important that you meet with a dietitian who will help you create a meal plan that is just right for you.   This information is not intended to replace advice given to you by your health care provider. Make sure you discuss any questions you have with your health care provider.   Document Released: 08/16/2005 Document Revised: 12/10/2014 Document Reviewed: 10/16/2013 Elsevier Interactive Patient Education 2016 [employer] Inc[employer]   Cardiomyopathy Cardiomyopathy is a long-term (chronic) disease of the heart muscle (myocardium). Over time, the heart becomes abnormally large, thick, or stiff. This makes it harder for the heart to pump blood and can lead to heart failure. There are several types of cardiomyopathy:  Dilated cardiomyopathy. This type causes the ventricles become large and weak.  Hypertrophic cardiomyopathy. This type causes the heart muscle to thicken.  Restrictive cardiomyopathy. This type causes the heart muscle to become rigid and less elastic.  Ischemic cardiomyopathy. This type involves narrowing arteries that cause the walls of the heart get thinner.  Peripartum cardiomyopathy. This type occurs during pregnancy or shortly after pregnancy. CAUSES The cause of cardiomyopathy is often not known. In some cases, it is passed down (inherited) from a family member who also had cardiomyopathy. The disease may develop as a complication of another medical condition. These conditions can include:  Diabetes.  High blood pressure.  Viral infection of the heart.  Heart attack.  Coronary heart disease. RISK FACTORS You may be more likely to develop cardiomyopathy if you:  Have a family history  of cardiomyopathy or other heart problems.  Are overweight or obese.  Use illegal drugs.  Abuse alcohol.  Have diabetes.  Have another disease that can cause cardiomyopathy as a complication. SIGNS AND SYMPTOMS Often, cardiomyopathy has no signs or symptoms. If you do have symptoms, they may include:  Shortness of breath, especially during activity.  Fatigue.  An irregular heartbeat (arrhythmia).  Dizziness, light-headedness, or fainting.  Chest pain.  Swelling in the lower leg or ankle. DIAGNOSIS Your health care provider may suspect cardiomyopathy based on your symptoms and medical history. Your health care provider will also do a physical exam. Other tests done may include:  Blood tests.  Imaging studies of your heart. These may be done using:  X-rays to check if your heart is enlarged.  Echocardiogram to show the size of your heart and how well it pumps.  MRI.  A test to record the electrical activity of your heart (electrocardiogram or ECG).  A test in which you wear a portable device (event monitor) to record your heart's electrical activity while you go about your day.  A test to monitor your heart's activity while you exercise (stress test).   A procedure to check the blood pressure and blood flow in your heart(cardiac catheterization).  Injection of dye into your arteries before imaging studies are taken (angiogram).  Removal of a sample of heart tissue (biopsy). The sample is  examined for problems. TREATMENT Treatment depends on the type of cardiomyopathy you have and the severity of your symptoms. If you are not having any symptoms, you might not need treatment. If you need treatment, it may include:  Lifestyle changes.  Quit smoking, if you smoke.  Maintain a healthy weight. Lose weight if directed by your health care provider.  Eat a healthy diet. Include plenty of fruits, vegetables, and whole grains.  Get regular exercise. Ask your health  care provider to suggest some activities that are good for you.  Medicine. You may need to take medicine to:  Lower your blood pressure.  Slow down your heart rate.  Keep your heart beating in a steady rhythm.  Clear excess fluids from your body.  Prevent blood clots.  Surgery. You may need surgery to:  Repair a defect.  Remove thickened tissue.  Implant a device to treat serious heart rhythm problems (implantable cardioverter-defibrillator or ICD).  Replace your heart (heart transplant) if all other treatments have failed (end stage). HOME CARE INSTRUCTIONS  Take medicines only as directed by your health care provider.  Eat a heart-healthy diet. Work with your health care provider or a registered dietitian to learn about healthy eating options.  Maintain a healthy weight and stay physically active.  Do not use any tobacco products, including cigarettes, chewing tobacco, or electronic cigarettes. If you need help quitting, ask your health care provider.  Work closely with your health care provider to manage chronic conditions, such as diabetes and high blood pressure.  Limit alcohol intake to no more than one drink per day for nonpregnant women and no more than two drinks per day for men. One drink equals 12 ounces of beer, 5 ounces of wine, or 1 ounces of hard liquor.  Try to get at least 7 hours of sleep each night.  Find ways to manage stress.  Keep all follow-up visits as directed by your health care provider. This is important. SEEK MEDICAL CARE IF:  Your symptoms get worse, even after treatment.  You have new symptoms. SEEK IMMEDIATE MEDICAL CARE IF:  You have severe chest pain.  You have shortness of breath.  You cough up pink, bubbly material.  You have sudden sweating.  You feel nauseous and vomit.  You suddenly become light-headed or dizzy.  You feel your heart beating very fast.  It feels like your heart is skipping beats. These symptoms may  represent a serious problem that is an emergency. Do not wait to see if the symptoms will go away. Get medical help right away. Call your local emergency services (911 in the [place] Do not drive yourself to the hospital.   This information is not intended to replace advice given to you by your health care provider. Make sure you discuss any questions you have with your health care provider.   Document Released: 02/01/2005 Document Revised: 12/10/2014 Document Reviewed: 04/29/2014 Elsevier Interactive Patient Education 2016 Else[employer] Inc.   DASH Eating Plan DASH stands for "Dietary Approaches to Stop Hypertension." The DASH eating plan is a healthy eating plan that has been shown to reduce high blood pressure (hypertension). Additional health benefits may include reducing the risk of type 2 diabetes mellitus, heart disease, and stroke. The DASH eating plan may also help with weight loss. WHAT DO I NEED TO KNOW ABOUT THE DASH EATING PLAN? For the DASH eating plan, you will follow these general guidelines:  Choose foods with a percent daily value for sodium of less  than 5% (as listed on the food label).  Use salt-free seasonings or herbs instead of table salt or sea salt.  Check with your health care provider or pharmacist before using salt substitutes.  Eat lower-sodium products, often labeled as "lower sodium" or "no salt added."  Eat fresh foods.  Eat more vegetables, fruits, and low-fat dairy products.  Choose whole grains. Look for the word "whole" as the first word in the ingredient list.  Choose fish and skinless chicken or [place] more often than red meat. Limit fish, poultry, and meat to 6 oz (170 g) each day.  Limit sweets, desserts, sugars, and sugary drinks.  Choose heart-healthy fats.  Limit cheese to 1 oz (28 g) per day.  Eat more home-cooked food and less restaurant, buffet, and fast food.  Limit fried foods.  Cook foods using methods other than frying.  Limit  canned vegetables. If you do use them, rinse them well to decrease the sodium.  When eating at a restaurant, ask that your food be prepared with less salt, or no salt if possible. WHAT FOODS CAN I EAT? Seek help from a dietitian for individual calorie needs. Grains Whole grain or whole wheat bread. Brown rice. Whole grain or whole wheat pasta. Quinoa, bulgur, and whole grain cereals. Low-sodium cereals. Corn or whole wheat flour tortillas. Whole grain cornbread. Whole grain crackers. Low-sodium crackers. Vegetables Fresh or frozen vegetables (raw, steamed, roasted, or grilled). Low-sodium or reduced-sodium tomato and vegetable juices. Low-sodium or reduced-sodium tomato sauce and paste. Low-sodium or reduced-sodium canned vegetables.  Fruits All fresh, canned (in natural juice), or frozen fruits. Meat and Other Protein Products Ground beef (85% or leaner), grass-fed beef, or beef trimmed of fat. Skinless chicken or [place]. Ground chicken or [place]. Pork trimmed of fat. All fish and seafood. Eggs. Dried beans, peas, or lentils. Unsalted nuts and seeds. Unsalted canned beans. Dairy Low-fat dairy products, such as skim or 1% milk, 2% or reduced-fat cheeses, low-fat ricotta or cottage cheese, or plain low-fat yogurt. Low-sodium or reduced-sodium cheeses. Fats and Oils Tub margarines without trans fats. Light or reduced-fat mayonnaise and salad dressings (reduced sodium). Avocado. Safflower, olive, or canola oils. Natural peanut or almond butter. Other Unsalted popcorn and pretzels. The items listed above may not be a complete list of recommended foods or beverages. Contact your dietitian for more options. WHAT FOODS ARE NOT RECOMMENDED? Grains White bread. White pasta. White rice. Refined cornbread. Bagels and croissants. Crackers that contain trans fat. Vegetables Creamed or fried vegetables. Vegetables in a cheese sauce. Regular canned vegetables. Regular canned tomato sauce and paste. Regular  tomato and vegetable juices. Fruits Dried fruits. Canned fruit in light or heavy syrup. Fruit juice. Meat and Other Protein Products Fatty cuts of meat. Ribs, chicken wings, bacon, sausage, bologna, salami, chitterlings, fatback, hot dogs, bratwurst, and packaged luncheon meats. Salted nuts and seeds. Canned beans with salt. Dairy Whole or 2% milk, cream, half-and-half, and cream cheese. Whole-fat or sweetened yogurt. Full-fat cheeses or blue cheese. Nondairy creamers and whipped toppings. Processed cheese, cheese spreads, or cheese curds. Condiments Onion and garlic salt, seasoned salt, table salt, and sea salt. Canned and packaged gravies. Worcestershire sauce. Tartar sauce. Barbecue sauce. Teriyaki sauce. Soy sauce, including reduced sodium. Steak sauce. Fish sauce. Oyster sauce. Cocktail sauce. Horseradish. Ketchup and mustard. Meat flavorings and tenderizers. Bouillon cubes. Hot sauce. Tabasco sauce. Marinades. Taco seasonings. Relishes. Fats and Oils Butter, stick margarine, lard, shortening, ghee, and bacon fat. Coconut, palm kernel, or palm oils. Regular  salad dressings. Other Pickles and olives. Salted popcorn and pretzels. The items listed above may not be a complete list of foods and beverages to avoid. Contact your dietitian for more information. WHERE CAN I FIND MORE INFORMATION? National Heart, Lung, and Blood Institute: [employer]   This information is not intended to replace advice given to you by your health care provider. Make sure you discuss any questions you have with your health care provider.   Document Released: 11/08/2011 Document Revised: 12/10/2014 Document Reviewed: 09/23/2013 Elsevier Interactive Patient Education [employer]

## 2016-03-06 NOTE — Progress Notes (Signed)
Travis Tran, is a 60 y.o. male  FXT:[id number]  GDJ:[id number]  DOB - [birth year]  CC:  Chief Complaint  Patient presents with  . Follow-up       HPI: Travis Tran is a 60 y.o. male here today to establish medical care.  This is his first time here, his wife comes to this clinic.  Pt was recently hospitalized 3/27-4/2 for volume overload, 2nd to heart failure.  EF found to be 10%.  Pt has hx of significant CAD, sp right/left heart cath 03/01/16, please see [facility] notes.  Had significant 2/5 grafts occluded 100%.  Pt w/ significant hx of DM, ooc, as well, currently tolerating insulin.  He says his appetite has improved since he left hospital, and urinating frequently w/ the lasix.  He can see his knee caps now w/ all the urinating.   Patient has No headache, No chest pain, No abdominal pain - No Nausea, No new weakness tingling or numbness, No Cough.  Some DOE, but only b/c he's been more active since discharge, but no pleuritic pain.    Never had colonoscopy, interested in getting one.  Has not had eye exam in while, c/o of constant blurry vision.  Allergies  Allergen Reactions  . Sulfa Antibiotics Other (See Comments)    unspecified   Past Medical History  Diagnosis Date  . 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 Hypertension   . High cholesterol   . Type II diabetes mellitus (HCC)   . Asthma   . GERD (gastroesophageal reflux disease)     "once in awhile"  . Rheumatoid arthritis (HCC)   . Psoriatic arthritis (HCC)   . Anxiety   . CAD, multiple vessel   . S/P CABG x 5     [facility] RIMA-LAD, SVG-OM, SVG-DIAG, seqSVG-RPDA-dRCA    Current Outpatient Prescriptions on File Prior to Visit  Medication Sig Dispense Refill  . aspirin 81 MG EC tablet Take 1 tablet (81 mg total) by mouth daily. 30 tablet 2  . atorvastatin (LIPITOR) 80 MG tablet Take 1 tablet (80 mg total) by mouth daily at 6 PM. 30 tablet 2  . carvedilol (COREG) 6.25 MG tablet Take 1 tablet (6.25 mg total) by mouth 2  (two) times daily with a meal. 60 tablet 2  . furosemide (LASIX) 40 MG tablet Take 1 tablet (40 mg total) by mouth 2 (two) times daily. 60 tablet 2  . insulin glargine (LANTUS) 100 UNIT/ML injection Inject 0.15 mLs (15 Units total) into the skin at bedtime. 10 mL 11  . INSULIN SYRINGE .5CC/29G 29G X 1/2" 0.5 ML MISC Use one syringe per insulin injection. 100 each 11  . lisinopril (PRINIVIL,ZESTRIL) 2.5 MG tablet Take 1 tablet (2.5 mg total) by mouth daily. 30 tablet 2  . pantoprazole (PROTONIX) 40 MG tablet Take 1 tablet (40 mg total) by mouth daily. 30 tablet 2   No current facility-administered medications on file prior to visit.   Family History  Problem Relation Age of Onset  . Heart attack Father 2   Social History   Social History  . Marital Status: Single    Spouse Name: N/A  . Number of Children: N/A  . Years of Education: N/A   Occupational History  . Not on file.   Social History Main Topics  . Smoking status: Never Smoker   . Smokeless tobacco: Never Used  . Alcohol Use: No     Comment: 02/27/2016 "If I have 3 drinks/year, that's alot"  .  Drug Use: Yes    Special: Marijuana     Comment: 02/27/2016 "2-3 times/week"  . Sexual Activity: Not Currently   Other Topics Concern  . Not on file   Social History Narrative    Review of Systems: Constitutional: Negative for fever, chills, diaphoresis, activity change, appetite change and fatigue. HENT: Negative for ear pain, nosebleeds, congestion, facial swelling, rhinorrhea, neck pain, neck stiffness and ear discharge.     +blurry vision, constant, ongoing. Eyes: Negative for pain, discharge, redness, itching and visual disturbance. Respiratory: Negative for cough, choking, chest tightness, shortness of breath, wheezing and stridor.  Cardiovascular: Negative for chest pain, palpitations; + leg swelling better; +complete dextrocardia Gastrointestinal: Negative for abdominal distention.   Genitourinary: Negative for  dysuria, urgency, frequency, hematuria, flank pain, decreased urine volume, difficulty urinating and dyspareunia.  Lots of urination w/ lasix Musculoskeletal: Negative for back pain, joint swelling, arthralgia and gait problem. Neurological: Negative for dizziness, tremors, seizures, syncope, facial asymmetry, speech difficulty, weakness, light-headedness, numbness and headaches.  Hematological: Negative for adenopathy. Does not bruise/bleed easily. Psychiatric/Behavioral: Negative for hallucinations, behavioral problems, confusion, dysphoric mood, decreased concentration and agitation.    Objective:   Filed Vitals:   03/06/16 1424  BP: 108/77  Pulse: 73  Temp: 98 F (36.7 C)  Resp: 16    Physical Exam: Constitutional: Patient appears well-developed and well-nourished. No distress. AAOx3, HENT: Normocephalic, atraumatic, External right and left ear normal. Oropharynx is clear and moist.  Eyes: Conjunctivae and EOM are normal. PERRL, no scleral icterus. Neck: Normal ROM. Neck supple. No JVD. No tracheal deviation. No thyromegaly. CVS: RRR, S1/S2 +, no murmurs, no gallops, no carotid bruit.  Pulmonary: Effort and breath sounds normal, no stridor, rhonchi, wheezes, rales.  Abdominal: Soft. BS +, no distension, tenderness, rebound or guarding.  Musculoskeletal: Normal range of motion. No edema and no tenderness.  Lymphadenopathy: No lymphadenopathy noted, cervical. LE: 1+ edema bilat le. Neuro: Alert. Normal reflexes, muscle tone coordination. No cranial nerve deficit grossly. Skin: Skin is warm and dry. No rash noted. Not diaphoretic. No erythema. No pallor. Psychiatric: Normal mood and affect. Behavior, judgment, thought content normal.  Lab Results  Component Value Date   WBC 6.9 03/01/2016   HGB 13.6 03/01/2016   HCT 43.3 03/01/2016   MCV 83.3 03/01/2016   PLT 178 03/01/2016   Lab Results  Component Value Date   CREATININE 1.23 03/04/2016   BUN 21* 03/04/2016   NA 135  03/04/2016   K 3.9 03/04/2016   CL 97* 03/04/2016   CO2 31 03/04/2016    Lab Results  Component Value Date   HGBA1C 15.1* 02/27/2016   Lipid Panel     Component Value Date/Time   CHOL 191 02/28/2016 0524   TRIG 110 02/28/2016 0524   HDL 49 02/28/2016 0524   CHOLHDL 3.9 02/28/2016 0524   VLDL 22 02/28/2016 0524   LDLCALC 120* 02/28/2016 0524     right/left cath 03/01/16 Conclusion    1. Complete dextrocardia - images viewed are 100% reversed from true image 2. Severe diffuse native coronary artery disease in dextrocardia configuration 3. Ost RCA lesion, 80% stenosed. Prox RCA lesion, 80% stenosed. Mid RCA to Dist RCA lesion, 100% stenosed. 4. Native LCA severely diseased. Ost LM to LM lesion, 45% stenosed. Ost LAD lesion, 90% stenosed. Mid LAD lesion, 100% stenosed. Prox Cx lesion, 99% stenosed. 1st Mrg lesion, 99% stenosed. Mid Cx lesion, 100% stenosed. 5. SVG-OM & SVG-Diag unable to visualized. Presumed to be 100% occluded 6.  Chestine Spore[X32.440N TEXTTAG>rthl dre ForthClaud [id number][X32.440N Van Clines[address]  Dierdre ForthClaud KelpChestine Spore[X32.440N Van Clines  verbalized understanding. The patient was told to call to get lab results if they haven't heard anything in the next week.      Pete Glatter, MD, MBA/MHA [facility] [place], [place] [phone]   03/06/2016, 3:05 PM

## 2016-03-07 ENCOUNTER — Encounter: Payer: Self-pay | Admitting: Clinical

## 2016-03-07 LAB — BASIC METABOLIC PANEL
BUN: 29 mg/dL — ABNORMAL HIGH (ref 7–25)
CALCIUM: 7.7 mg/dL — AB (ref 8.6–10.3)
CO2: 30 mmol/L (ref 20–31)
CREATININE: 1.06 mg/dL (ref 0.70–1.33)
Chloride: 97 mmol/L — ABNORMAL LOW (ref 98–110)
Glucose, Bld: 80 mg/dL (ref 65–99)
Potassium: 4.6 mmol/L (ref 3.5–5.3)
SODIUM: 136 mmol/L (ref 135–146)

## 2016-03-07 NOTE — Progress Notes (Signed)
Depression screen PHQ 2/9 03/06/2016  Decreased Interest 1  Down, Depressed, Hopeless 0  PHQ - 2 Score 1  Altered sleeping 1  Tired, decreased energy 3  Change in appetite 2  Feeling bad or failure about yourself  0  Trouble concentrating 0  Moving slowly or fidgety/restless 0  Suicidal thoughts 0  PHQ-9 Score 7   GAD7 from 03-06-16 visit GAD 7 : Generalized Anxiety Score 03/07/2016  Nervous, Anxious, on Edge 1  Control/stop worrying 0  Worry too much - different things 1  Trouble relaxing 3  Restless 2  Easily annoyed or irritable 2  Afraid - awful might happen 0  Total GAD 7 Score 9

## 2016-03-09 ENCOUNTER — Ambulatory Visit (INDEPENDENT_AMBULATORY_CARE_PROVIDER_SITE_OTHER): Payer: Medicaid Other | Admitting: Cardiology

## 2016-03-09 ENCOUNTER — Encounter: Payer: Self-pay | Admitting: Cardiology

## 2016-03-09 VITALS — BP 114/64 | HR 77 | Ht 66.0 in | Wt 165.0 lb

## 2016-03-09 DIAGNOSIS — I10 Essential (primary) hypertension: Secondary | ICD-10-CM | POA: Diagnosis not present

## 2016-03-09 DIAGNOSIS — I5042 Chronic combined systolic (congestive) and diastolic (congestive) heart failure: Secondary | ICD-10-CM

## 2016-03-09 LAB — BASIC METABOLIC PANEL
BUN: 24 mg/dL (ref 7–25)
CALCIUM: 8.8 mg/dL (ref 8.6–10.3)
CO2: 31 mmol/L (ref 20–31)
CREATININE: 1.2 mg/dL (ref 0.70–1.33)
Chloride: 101 mmol/L (ref 98–110)
Glucose, Bld: 157 mg/dL — ABNORMAL HIGH (ref 65–99)
Potassium: 5.2 mmol/L (ref 3.5–5.3)
Sodium: 137 mmol/L (ref 135–146)

## 2016-03-09 MED ORDER — SPIRONOLACTONE 25 MG PO TABS: 12.5000 mg | ORAL_TABLET | Freq: Every day | ORAL | Status: DC

## 2016-03-09 NOTE — Progress Notes (Signed)
03/09/2016 Travis Tran   1956/04/12  [id number]  Primary Physician Pete Glatter, MD Primary Cardiologist: Dr. 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   He presented to ER 02/27/16 for edema, swelling, DOE and chest pressure. Troponin 0.07>>0.06>>0.07>>0.006, BNP 2978. EKG ST with LAFB, low voltage precordial leads non specific T wave changes lat leads.He was admitted for acute HF. Treated with IV diuretics. 2D echo obtained and showed severe LVEF of 10% + G3DD.  Subsequent cath showed severe bypass graft and native coronary disease without revascularization options. Patient has diffuse small vessel CAD. Widely patent RIMA to LAD followed by diffuse threadlike native LAD; widely patent saphenous vein graft to the right coronary with large but diffusely diseased distal vessels. No PCI performed. Medical therapy was recommended. He was aggressively diuresed with IV Lasix down from 183 pounds on admission to 158 pounds at time of discharge. Transition to 40 mg of by mouth Lasix twice a day. He was also placed on low-dose lisinopril, 2.5 mg daily as well as beta blocker therapy with carvedilol dose of 6.25 mg twice a day. He was also placed on aspirin as well as high intensity statin therapy with 80 mg of Lipitor daily.  He presents to clinic today for post hospital follow-up. He reports that he has done fairly well. He still has mild bilateral lower extremity edema on physical exam and continues to have mild exertional dyspnea but denies any resting dyspnea. He continues to have mild orthopnea but this is stable since discharge. He continues to sleep with 2 pillows. He denies any PND. He's been fully  compliant with his medications. He has not noticed any significant weight gain at home. He has been attempting to maintain a low sodium diet but he reports that this has been quite difficult for him, butt he is working on trying to improve this.   Current Outpatient Prescriptions  Medication Sig Dispense Refill  . aspirin 81 MG EC tablet Take 1 tablet (81 mg total) by mouth daily. 30 tablet 2  . atorvastatin (LIPITOR) 80 MG tablet Take 1 tablet (80 mg total) by mouth daily at 6 PM. 30 tablet 2  . budesonide-formoterol (SYMBICORT) 80-4.5 MCG/ACT inhaler Inhale 2 puffs into the lungs 2 (two) times daily.    . carvedilol (COREG) 6.25 MG tablet Take 1 tablet (6.25 mg total) by mouth 2 (two) times daily with a meal. 60 tablet 2  . furosemide (LASIX) 40 MG tablet Take 1 tablet (40 mg total) by mouth 2 (two) times daily. 60 tablet 2  . insulin aspart (NOVOLOG) 100 UNIT/ML injection Inject 5 Units into the skin 3 (three) times daily with meals. 10 mL 2  . insulin glargine (LANTUS) 100 UNIT/ML injection Inject 0.15 mLs (15 Units total) into the skin at bedtime. 10 mL 11  . INSULIN SYRINGE .5CC/29G 29G X 1/2" 0.5 ML MISC Use one syringe per insulin injection. 100 each 11  . lisinopril (PRINIVIL,ZESTRIL) 2.5 MG tablet Take 1 tablet (2.5 mg total) by mouth daily. 30 tablet 2  . pantoprazole (PROTONIX) 40 MG tablet Take 1 tablet (40 mg total) by mouth daily. 30 tablet 2   No current facility-administered medications for this visit.  Allergies  Allergen Reactions  . Celecoxib Swelling  . Sulfa Antibiotics Other (See Comments)    unspecified    Social History   Social History  . Marital Status: Single    Spouse Name: N/A  . Number of Children: N/A  . Years of Education: N/A   Occupational History  . Not on file.   Social History Main Topics  . Smoking status: Never Smoker   . Smokeless tobacco: Never Used  . Alcohol Use: No     Comment: 02/27/2016 "If I have 3 drinks/year, that's alot"  .  Drug Use: Yes    Special: Marijuana     Comment: 02/27/2016 "2-3 times/week"  . Sexual Activity: Not Currently   Other Topics Concern  . Not on file   Social History Narrative     Review of Systems: General: negative for chills, fever, night sweats or weight changes.  Cardiovascular: negative for chest pain, dyspnea on exertion, edema, orthopnea, palpitations, paroxysmal nocturnal dyspnea or shortness of breath Dermatological: negative for rash Respiratory: negative for cough or wheezing Urologic: negative for hematuria Abdominal: negative for nausea, vomiting, diarrhea, bright red blood per rectum, melena, or hematemesis Neurologic: negative for visual changes, syncope, or dizziness All other systems reviewed and are otherwise negative except as noted above.    Blood pressure 114/64, pulse 77, height 5\' 6"  (1.676 m), weight 165 lb (74.844 kg).  General appearance: alert, cooperative and no distress Neck: no carotid bruit and no JVD Lungs: clear to auscultation bilaterally Heart: regular rate and rhythm, S1, S2 normal, no murmur, click, rub or gallop Extremities: 2+ bilateral LEE Pulses: 2+ and symmetric Skin: warm and dry Neurologic: Grossly normal  EKG not performed  ASSESSMENT AND PLAN:    1. Chronic Combined Systolic and Diastolic: improved significantly, but still with 2+ bilateral pitting edema which he reports has been fairly constant since discharge. Mild DOE and 2 pillow orthopnea which has also remained constant. He denies any worsening symptoms. No resting dyspnea. We will continue Lasix, 40 mg BID and will add spironolactone, 12.5 mg daily. Check BMP today and recheck again in 1 week to reassess renal function and K. Continue Coreg and lisinopril. Unable to adjust doses further today, given needed initiation of spironolactone. Additional medication titration at this time may cause unsafe drop in BP. We dicussed continuation of low sodium diet and daily weights.   2.  CAD: severe CAD with no PCI targets. Plan is to continue medical therapy. He denies any CP. Continue ASA, statin, BB and ACE-I.    PLAN f/u in 2 weeks for further optimization of HF meds, as BP and HR allow. Would recommend further adjustment of his ACE-I at that time, if possible.   PA-C 03/09/2016 2:49 PM

## 2016-03-09 NOTE — Patient Instructions (Signed)
Medication Instructions:  Your physician has recommended you make the following change in your medication:  1.  START Spironolactone 25 mg taking 1/2 tablet twice a day   Labwork: TODAY:  BMET 03/16/16:  BMET  Testing/Procedures: None ordered  Follow-Up: Your physician recommends that you schedule a follow-up appointment in: 2 WEEKS WITH 1ST AVAILABLE EXTENDER OR PHARMACIST FOR CHF / POSSIBLE  MED ADJUSTMENTS   Any Other Special Instructions Will Be Listed Below (If Applicable).  Low-Sodium Eating Plan Sodium raises blood pressure and causes water to be held in the body. Getting less sodium from food will help lower your blood pressure, reduce any swelling, and protect your heart, liver, and kidneys. We get sodium by adding salt (sodium chloride) to food. Most of our sodium comes from canned, boxed, and frozen foods. Restaurant foods, fast foods, and pizza are also very high in sodium. Even if you take medicine to lower your blood pressure or to reduce fluid in your body, getting less sodium from your food is important. WHAT IS MY PLAN? Most people should limit their sodium intake to 2,300 mg a day. Your health care provider recommends that you limit your sodium intake to __________ a day.  WHAT DO I NEED TO KNOW ABOUT THIS EATING PLAN? For the low-sodium eating plan, you will follow these general guidelines:  Choose foods with a % Daily Value for sodium of less than 5% (as listed on the food label).   Use salt-free seasonings or herbs instead of table salt or sea salt.   Check with your health care provider or pharmacist before using salt substitutes.   Eat fresh foods.  Eat more vegetables and fruits.  Limit canned vegetables. If you do use them, rinse them well to decrease the sodium.   Limit cheese to 1 oz (28 g) per day.   Eat lower-sodium products, often labeled as "lower sodium" or "no salt added."  Avoid foods that contain monosodium glutamate (MSG). MSG is  sometimes added to [place] food and some canned foods.  Check food labels (Nutrition Facts labels) on foods to learn how much sodium is in one serving.  Eat more home-cooked food and less restaurant, buffet, and fast food.  When eating at a restaurant, ask that your food be prepared with less salt, or no salt if possible.  HOW DO I READ FOOD LABELS FOR SODIUM INFORMATION? The Nutrition Facts label lists the amount of sodium in one serving of the food. If you eat more than one serving, you must multiply the listed amount of sodium by the number of servings. Food labels may also identify foods as:  Sodium free--Less than 5 mg in a serving.  Very low sodium--35 mg or less in a serving.  Low sodium--140 mg or less in a serving.  Light in sodium--50% less sodium in a serving. For example, if a food that usually has 300 mg of sodium is changed to become light in sodium, it will have 150 mg of sodium.  Reduced sodium--25% less sodium in a serving. For example, if a food that usually has 400 mg of sodium is changed to reduced sodium, it will have 300 mg of sodium. WHAT FOODS CAN I EAT? Grains Low-sodium cereals, including oats, puffed wheat and rice, and shredded wheat cereals. Low-sodium crackers. Unsalted rice and pasta. Lower-sodium bread.  Vegetables Frozen or fresh vegetables. Low-sodium or reduced-sodium canned vegetables. Low-sodium or reduced-sodium tomato sauce and paste. Low-sodium or reduced-sodium tomato and vegetable juices.  Fruits Fresh,  frozen, and canned fruit. Fruit juice.  Meat and Other Protein Products Low-sodium canned tuna and salmon. Fresh or frozen meat, poultry, seafood, and fish. Lamb. Unsalted nuts. Dried beans, peas, and lentils without added salt. Unsalted canned beans. Homemade soups without salt. Eggs.  Dairy Milk. Soy milk. Ricotta cheese. Low-sodium or reduced-sodium cheeses. Yogurt.  Condiments Fresh and dried herbs and spices. Salt-free  seasonings. Onion and garlic powders. Low-sodium varieties of mustard and ketchup. Fresh or refrigerated horseradish. Lemon juice.  Fats and Oils Reduced-sodium salad dressings. Unsalted butter.  Other Unsalted popcorn and pretzels.  The items listed above may not be a complete list of recommended foods or beverages. Contact your dietitian for more options. WHAT FOODS ARE NOT RECOMMENDED? Grains Instant hot cereals. Bread stuffing, pancake, and biscuit mixes. Croutons. Seasoned rice or pasta mixes. Noodle soup cups. Boxed or frozen macaroni and cheese. Self-rising flour. Regular salted crackers. Vegetables Regular canned vegetables. Regular canned tomato sauce and paste. Regular tomato and vegetable juices. Frozen vegetables in sauces. Salted [place] fries. Olives. Rosita Fire. Relishes. Sauerkraut. Salsa. Meat and Other Protein Products Salted, canned, smoked, spiced, or pickled meats, seafood, or fish. Bacon, ham, sausage, hot dogs, corned beef, chipped beef, and packaged luncheon meats. Salt pork. Jerky. Pickled herring. Anchovies, regular canned tuna, and sardines. Salted nuts. Dairy Processed cheese and cheese spreads. Cheese curds. Blue cheese and cottage cheese. Buttermilk.  Condiments Onion and garlic salt, seasoned salt, table salt, and sea salt. Canned and packaged gravies. Worcestershire sauce. Tartar sauce. Barbecue sauce. Teriyaki sauce. Soy sauce, including reduced sodium. Steak sauce. Fish sauce. Oyster sauce. Cocktail sauce. Horseradish that you find on the shelf. Regular ketchup and mustard. Meat flavorings and tenderizers. Bouillon cubes. Hot sauce. Tabasco sauce. Marinades. Taco seasonings. Relishes. Fats and Oils Regular salad dressings. Salted butter. Margarine. Ghee. Bacon fat.  Other Potato and tortilla chips. Corn chips and puffs. Salted popcorn and pretzels. Canned or dried soups. Pizza. Frozen entrees and pot pies.  The items listed above may not be a complete  list of foods and beverages to avoid. Contact your dietitian for more information.   This information is not intended to replace advice given to you by your health care provider. Make sure you discuss any questions you have with your health care provider.   Document Released: 05/11/2002 Document Revised: 12/10/2014 Document Reviewed: 09/23/2013 Elsevier Interactive Patient Education [employer]  Your physician recommends that you weigh, daily, at the same time every day, and in the same amount of clothing. Please record your daily weights on the handout provided and bring it to your next appointment. IF YOU GAIN MORE 3 LBS IN A DAY OR 5 LBS IN A WEEK, PLEASE CALL OUR OFFICE    If you need a refill on your cardiac medications before your next appointment, please call your pharmacy.

## 2016-03-16 ENCOUNTER — Other Ambulatory Visit (INDEPENDENT_AMBULATORY_CARE_PROVIDER_SITE_OTHER): Payer: Medicaid Other | Admitting: *Deleted

## 2016-03-16 DIAGNOSIS — I10 Essential (primary) hypertension: Secondary | ICD-10-CM | POA: Diagnosis not present

## 2016-03-16 DIAGNOSIS — I5042 Chronic combined systolic (congestive) and diastolic (congestive) heart failure: Secondary | ICD-10-CM

## 2016-03-16 LAB — BASIC METABOLIC PANEL
BUN: 21 mg/dL (ref 7–25)
CALCIUM: 8.3 mg/dL — AB (ref 8.6–10.3)
CO2: 30 mmol/L (ref 20–31)
Chloride: 101 mmol/L (ref 98–110)
Creat: 1.05 mg/dL (ref 0.70–1.33)
Glucose, Bld: 150 mg/dL — ABNORMAL HIGH (ref 65–99)
POTASSIUM: 4.5 mmol/L (ref 3.5–5.3)
SODIUM: 138 mmol/L (ref 135–146)

## 2016-03-20 ENCOUNTER — Inpatient Hospital Stay (HOSPITAL_COMMUNITY)
Admission: EM | Admit: 2016-03-20 | Discharge: 2016-03-24 | DRG: 035 | Disposition: A | Discharge status: Home / Self Care | Payer: Medicaid Other | Attending: Internal Medicine | Admitting: Internal Medicine

## 2016-03-20 ENCOUNTER — Observation Stay (HOSPITAL_COMMUNITY): Payer: Medicaid Other

## 2016-03-20 ENCOUNTER — Encounter (HOSPITAL_COMMUNITY): Payer: Self-pay | Admitting: Emergency Medicine

## 2016-03-20 ENCOUNTER — Emergency Department (HOSPITAL_COMMUNITY): Payer: Medicaid Other

## 2016-03-20 ENCOUNTER — Encounter: Payer: Self-pay | Admitting: Cardiovascular Disease

## 2016-03-20 ENCOUNTER — Ambulatory Visit (INDEPENDENT_AMBULATORY_CARE_PROVIDER_SITE_OTHER): Payer: Self-pay | Admitting: Pharmacist

## 2016-03-20 ENCOUNTER — Encounter (HOSPITAL_COMMUNITY): Payer: Self-pay | Admitting: General Practice

## 2016-03-20 ENCOUNTER — Ambulatory Visit (INDEPENDENT_AMBULATORY_CARE_PROVIDER_SITE_OTHER): Payer: Medicaid Other | Admitting: Cardiovascular Disease

## 2016-03-20 DIAGNOSIS — Z881 Allergy status to other antibiotic agents status: Secondary | ICD-10-CM

## 2016-03-20 DIAGNOSIS — E7800 Pure hypercholesterolemia, unspecified: Secondary | ICD-10-CM | POA: Diagnosis present

## 2016-03-20 DIAGNOSIS — Z419 Encounter for procedure for purposes other than remedying health state, unspecified: Secondary | ICD-10-CM

## 2016-03-20 DIAGNOSIS — Z7951 Long term (current) use of inhaled steroids: Secondary | ICD-10-CM

## 2016-03-20 DIAGNOSIS — G458 Other transient cerebral ischemic attacks and related syndromes: Secondary | ICD-10-CM | POA: Diagnosis not present

## 2016-03-20 DIAGNOSIS — M069 Rheumatoid arthritis, unspecified: Secondary | ICD-10-CM | POA: Diagnosis present

## 2016-03-20 DIAGNOSIS — I6521 Occlusion and stenosis of right carotid artery: Secondary | ICD-10-CM | POA: Diagnosis present

## 2016-03-20 DIAGNOSIS — I2510 Atherosclerotic heart disease of native coronary artery without angina pectoris: Secondary | ICD-10-CM | POA: Diagnosis present

## 2016-03-20 DIAGNOSIS — I6522 Occlusion and stenosis of left carotid artery: Secondary | ICD-10-CM | POA: Diagnosis present

## 2016-03-20 DIAGNOSIS — I6523 Occlusion and stenosis of bilateral carotid arteries: Secondary | ICD-10-CM

## 2016-03-20 DIAGNOSIS — I255 Ischemic cardiomyopathy: Secondary | ICD-10-CM | POA: Diagnosis present

## 2016-03-20 DIAGNOSIS — E785 Hyperlipidemia, unspecified: Secondary | ICD-10-CM | POA: Diagnosis present

## 2016-03-20 DIAGNOSIS — I25810 Atherosclerosis of coronary artery bypass graft(s) without angina pectoris: Secondary | ICD-10-CM | POA: Diagnosis present

## 2016-03-20 DIAGNOSIS — I5040 Unspecified combined systolic (congestive) and diastolic (congestive) heart failure: Secondary | ICD-10-CM

## 2016-03-20 DIAGNOSIS — I429 Cardiomyopathy, unspecified: Secondary | ICD-10-CM

## 2016-03-20 DIAGNOSIS — I5022 Chronic systolic (congestive) heart failure: Secondary | ICD-10-CM | POA: Diagnosis not present

## 2016-03-20 DIAGNOSIS — E1122 Type 2 diabetes mellitus with diabetic chronic kidney disease: Secondary | ICD-10-CM | POA: Diagnosis present

## 2016-03-20 DIAGNOSIS — I63232 Cerebral infarction due to unspecified occlusion or stenosis of left carotid arteries: Secondary | ICD-10-CM | POA: Insufficient documentation

## 2016-03-20 DIAGNOSIS — Z794 Long term (current) use of insulin: Secondary | ICD-10-CM

## 2016-03-20 DIAGNOSIS — G451 Carotid artery syndrome (hemispheric): Secondary | ICD-10-CM

## 2016-03-20 DIAGNOSIS — I771 Stricture of artery: Secondary | ICD-10-CM

## 2016-03-20 DIAGNOSIS — I130 Hypertensive heart and chronic kidney disease with heart failure and stage 1 through stage 4 chronic kidney disease, or unspecified chronic kidney disease: Secondary | ICD-10-CM | POA: Diagnosis present

## 2016-03-20 DIAGNOSIS — R001 Bradycardia, unspecified: Secondary | ICD-10-CM | POA: Diagnosis not present

## 2016-03-20 DIAGNOSIS — I5042 Chronic combined systolic (congestive) and diastolic (congestive) heart failure: Secondary | ICD-10-CM | POA: Diagnosis present

## 2016-03-20 DIAGNOSIS — I959 Hypotension, unspecified: Secondary | ICD-10-CM | POA: Diagnosis not present

## 2016-03-20 DIAGNOSIS — R471 Dysarthria and anarthria: Secondary | ICD-10-CM | POA: Diagnosis present

## 2016-03-20 DIAGNOSIS — Z7982 Long term (current) use of aspirin: Secondary | ICD-10-CM

## 2016-03-20 DIAGNOSIS — Z8249 Family history of ischemic heart disease and other diseases of the circulatory system: Secondary | ICD-10-CM

## 2016-03-20 DIAGNOSIS — Q893 Situs inversus: Secondary | ICD-10-CM

## 2016-03-20 DIAGNOSIS — Z22322 Carrier or suspected carrier of Methicillin resistant Staphylococcus aureus: Secondary | ICD-10-CM

## 2016-03-20 DIAGNOSIS — I639 Cerebral infarction, unspecified: Secondary | ICD-10-CM

## 2016-03-20 DIAGNOSIS — R4701 Aphasia: Secondary | ICD-10-CM | POA: Diagnosis present

## 2016-03-20 DIAGNOSIS — Z888 Allergy status to other drugs, medicaments and biological substances status: Secondary | ICD-10-CM

## 2016-03-20 DIAGNOSIS — N182 Chronic kidney disease, stage 2 (mild): Secondary | ICD-10-CM | POA: Diagnosis present

## 2016-03-20 DIAGNOSIS — Z79899 Other long term (current) drug therapy: Secondary | ICD-10-CM

## 2016-03-20 DIAGNOSIS — E1165 Type 2 diabetes mellitus with hyperglycemia: Secondary | ICD-10-CM | POA: Diagnosis present

## 2016-03-20 DIAGNOSIS — G459 Transient cerebral ischemic attack, unspecified: Secondary | ICD-10-CM | POA: Diagnosis present

## 2016-03-20 DIAGNOSIS — F419 Anxiety disorder, unspecified: Secondary | ICD-10-CM | POA: Diagnosis present

## 2016-03-20 DIAGNOSIS — K219 Gastro-esophageal reflux disease without esophagitis: Secondary | ICD-10-CM | POA: Diagnosis present

## 2016-03-20 DIAGNOSIS — Z9889 Other specified postprocedural states: Secondary | ICD-10-CM

## 2016-03-20 DIAGNOSIS — Z951 Presence of aortocoronary bypass graft: Secondary | ICD-10-CM

## 2016-03-20 DIAGNOSIS — I6529 Occlusion and stenosis of unspecified carotid artery: Secondary | ICD-10-CM

## 2016-03-20 HISTORY — DX: Heart failure, unspecified: I50.9

## 2016-03-20 HISTORY — DX: Chronic kidney disease, stage 2 (mild): N18.2

## 2016-03-20 HISTORY — DX: Claustrophobia: F40.240

## 2016-03-20 HISTORY — DX: Transient cerebral ischemic attack, unspecified: G45.9

## 2016-03-20 LAB — I-STAT CHEM 8, ED
BUN: 29 mg/dL — ABNORMAL HIGH (ref 6–20)
CHLORIDE: 101 mmol/L (ref 101–111)
Calcium, Ion: 1.27 mmol/L — ABNORMAL HIGH (ref 1.12–1.23)
Creatinine, Ser: 1.2 mg/dL (ref 0.61–1.24)
Glucose, Bld: 84 mg/dL (ref 65–99)
HEMATOCRIT: 46 % (ref 39.0–52.0)
HEMOGLOBIN: 15.6 g/dL (ref 13.0–17.0)
POTASSIUM: 4.1 mmol/L (ref 3.5–5.1)
Sodium: 139 mmol/L (ref 135–145)
TCO2: 29 mmol/L (ref 0–100)

## 2016-03-20 LAB — PROTIME-INR
INR: 1.08 (ref 0.00–1.49)
PROTHROMBIN TIME: 14.2 s (ref 11.6–15.2)

## 2016-03-20 LAB — CBG MONITORING, ED: Glucose-Capillary: 94 mg/dL (ref 65–99)

## 2016-03-20 LAB — DIFFERENTIAL
BASOS ABS: 0 10*3/uL (ref 0.0–0.1)
Basophils Relative: 0 %
EOS PCT: 2 %
Eosinophils Absolute: 0.2 10*3/uL (ref 0.0–0.7)
Lymphocytes Relative: 23 %
Lymphs Abs: 2.4 10*3/uL (ref 0.7–4.0)
Monocytes Absolute: 0.9 10*3/uL (ref 0.1–1.0)
Monocytes Relative: 9 %
NEUTROS PCT: 66 %
Neutro Abs: 7.2 10*3/uL (ref 1.7–7.7)

## 2016-03-20 LAB — COMPREHENSIVE METABOLIC PANEL
ALBUMIN: 2.7 g/dL — AB (ref 3.5–5.0)
ALK PHOS: 81 U/L (ref 38–126)
ALT: 17 U/L (ref 17–63)
ANION GAP: 8 (ref 5–15)
AST: 20 U/L (ref 15–41)
BILIRUBIN TOTAL: 0.5 mg/dL (ref 0.3–1.2)
BUN: 25 mg/dL — AB (ref 6–20)
CALCIUM: 9.4 mg/dL (ref 8.9–10.3)
CO2: 28 mmol/L (ref 22–32)
Chloride: 104 mmol/L (ref 101–111)
Creatinine, Ser: 1.3 mg/dL — ABNORMAL HIGH (ref 0.61–1.24)
GFR calc Af Amer: >60 mL/min (ref >60)
GFR calc non Af Amer: 59 mL/min — ABNORMAL LOW (ref >60)
GLUCOSE: 96 mg/dL (ref 65–99)
POTASSIUM: 4.4 mmol/L (ref 3.5–5.1)
SODIUM: 140 mmol/L (ref 135–145)
Total Protein: 6.7 g/dL (ref 6.5–8.1)

## 2016-03-20 LAB — CBC
HCT: 42.9 % (ref 39.0–52.0)
HEMOGLOBIN: 13.2 g/dL (ref 13.0–17.0)
MCH: 25.5 pg — AB (ref 26.0–34.0)
MCHC: 30.8 g/dL (ref 30.0–36.0)
MCV: 83 fL (ref 78.0–100.0)
PLATELETS: 314 10*3/uL (ref 150–400)
RBC: 5.17 MIL/uL (ref 4.22–5.81)
RDW: 15.3 % (ref 11.5–15.5)
WBC: 10.7 10*3/uL — AB (ref 4.0–10.5)

## 2016-03-20 LAB — I-STAT TROPONIN, ED: Troponin i, poc: 0.02 ng/mL (ref 0.00–0.08)

## 2016-03-20 LAB — GLUCOSE, CAPILLARY: Glucose-Capillary: 111 mg/dL — ABNORMAL HIGH (ref 65–99)

## 2016-03-20 LAB — APTT: APTT: 32 s (ref 24–37)

## 2016-03-20 MED ORDER — INSULIN ASPART 100 UNIT/ML ~~LOC~~ SOLN
0.0000 [IU] | Freq: Three times a day (TID) | SUBCUTANEOUS | Status: DC
  Administered 2016-03-21 – 2016-03-22 (×2): 2 [IU] via SUBCUTANEOUS
  Administered 2016-03-22 (×2): 3 [IU] via SUBCUTANEOUS

## 2016-03-20 MED ORDER — LORAZEPAM 2 MG/ML IJ SOLN
1.0000 mg | Freq: Once | INTRAMUSCULAR | Status: AC
  Administered 2016-03-20: 1 mg via INTRAVENOUS
  Filled 2016-03-20: qty 1

## 2016-03-20 MED ORDER — IOPAMIDOL (ISOVUE-370) INJECTION 76%
INTRAVENOUS | Status: AC
  Administered 2016-03-20: 50 mL
  Filled 2016-03-20: qty 50

## 2016-03-20 MED ORDER — CARVEDILOL 6.25 MG PO TABS
6.2500 mg | ORAL_TABLET | Freq: Two times a day (BID) | ORAL | Status: DC
  Administered 2016-03-20 – 2016-03-21 (×2): 6.25 mg via ORAL
  Filled 2016-03-20 (×2): qty 1

## 2016-03-20 MED ORDER — ASPIRIN EC 81 MG PO TBEC
81.0000 mg | DELAYED_RELEASE_TABLET | Freq: Every day | ORAL | Status: DC
  Administered 2016-03-21 – 2016-03-22 (×3): 81 mg via ORAL
  Filled 2016-03-20 (×3): qty 1

## 2016-03-20 MED ORDER — PANTOPRAZOLE SODIUM 40 MG PO TBEC
40.0000 mg | DELAYED_RELEASE_TABLET | Freq: Every day | ORAL | Status: DC
  Administered 2016-03-20 – 2016-03-24 (×4): 40 mg via ORAL
  Filled 2016-03-20 (×4): qty 1

## 2016-03-20 MED ORDER — ATORVASTATIN CALCIUM 80 MG PO TABS
80.0000 mg | ORAL_TABLET | Freq: Every day | ORAL | Status: DC
  Administered 2016-03-20 – 2016-03-23 (×4): 80 mg via ORAL
  Filled 2016-03-20 (×4): qty 1

## 2016-03-20 MED ORDER — SPIRONOLACTONE 12.5 MG HALF TABLET
12.5000 mg | ORAL_TABLET | Freq: Every day | ORAL | Status: DC
Start: 2016-03-21 — End: 2016-03-24
  Administered 2016-03-22 – 2016-03-24 (×2): 12.5 mg via ORAL
  Filled 2016-03-20 (×5): qty 1

## 2016-03-20 MED ORDER — ENOXAPARIN SODIUM 40 MG/0.4ML ~~LOC~~ SOLN
40.0000 mg | SUBCUTANEOUS | Status: DC
  Administered 2016-03-20 – 2016-03-21 (×2): 40 mg via SUBCUTANEOUS
  Filled 2016-03-20 (×2): qty 0.4

## 2016-03-20 MED ORDER — SODIUM CHLORIDE 0.9% FLUSH
3.0000 mL | Freq: Two times a day (BID) | INTRAVENOUS | Status: DC
  Administered 2016-03-20 – 2016-03-23 (×6): 3 mL via INTRAVENOUS
  Administered 2016-03-24: 10 mL via INTRAVENOUS

## 2016-03-20 MED ORDER — CLOPIDOGREL BISULFATE 75 MG PO TABS
75.0000 mg | ORAL_TABLET | Freq: Every day | ORAL | Status: DC
  Administered 2016-03-20 – 2016-03-21 (×2): 75 mg via ORAL
  Filled 2016-03-20 (×2): qty 1

## 2016-03-20 MED ORDER — MOMETASONE FURO-FORMOTEROL FUM 100-5 MCG/ACT IN AERO
2.0000 | INHALATION_SPRAY | Freq: Two times a day (BID) | RESPIRATORY_TRACT | Status: DC
  Administered 2016-03-21 – 2016-03-24 (×4): 2 via RESPIRATORY_TRACT
  Filled 2016-03-20 (×3): qty 8.8

## 2016-03-20 MED ORDER — INSULIN GLARGINE 100 UNIT/ML ~~LOC~~ SOLN
10.0000 [IU] | Freq: Every day | SUBCUTANEOUS | Status: DC
  Administered 2016-03-21: 10 [IU] via SUBCUTANEOUS
  Filled 2016-03-20: qty 0.1

## 2016-03-20 MED ORDER — FUROSEMIDE 40 MG PO TABS
40.0000 mg | ORAL_TABLET | Freq: Two times a day (BID) | ORAL | Status: DC
  Administered 2016-03-20 – 2016-03-24 (×8): 40 mg via ORAL
  Filled 2016-03-20 (×9): qty 1

## 2016-03-20 MED ORDER — LISINOPRIL 2.5 MG PO TABS
2.5000 mg | ORAL_TABLET | Freq: Every day | ORAL | Status: DC
Start: 2016-03-21 — End: 2016-03-24
  Administered 2016-03-21 – 2016-03-24 (×3): 2.5 mg via ORAL
  Filled 2016-03-20 (×3): qty 1

## 2016-03-20 NOTE — Progress Notes (Signed)
03/20/2016 Travis Tran   06-28-1956  [id number]  Primary Physician Pete Glatter, MD Primary Cardiologist: Dr. 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   He presented to ER 02/27/16 for edema, swelling, DOE and chest pressure. Troponin 0.07>>0.06>>0.07>>0.006, BNP 2978. EKG ST with LAFB, low voltage precordial leads non specific T wave changes lat leads.He was admitted for acute HF. Treated with IV diuretics. 2D echo obtained and showed severe LVEF of 10% + G3DD.  Subsequent cath showed severe bypass graft and native coronary disease without revascularization options. Patient has diffuse small vessel CAD. Widely patent RIMA to LAD followed by diffuse threadlike native LAD; widely patent saphenous vein graft to the right coronary with large but diffusely diseased distal vessels. No PCI performed. Medical therapy was recommended. He was aggressively diuresed with IV Lasix down from 183 pounds on admission to 158 pounds at time of discharge. Transition to 40 mg of by mouth Lasix twice a day. He was also placed on low-dose lisinopril, 2.5 mg daily as well as beta blocker therapy with carvedilol dose of 6.25 mg twice a day. He was also placed on aspirin as well as high intensity statin therapy with 80 mg of Lipitor daily.  He presents to clinic today for post hospital follow-up. He reports that he has done fairly well. He still has mild bilateral lower extremity edema on physical exam and continues to have mild exertional dyspnea but denies any resting dyspnea. He continues to have mild orthopnea but this is stable since discharge. He continues to sleep with 2 pillows. He denies any PND. He's been fully  compliant with his medications. He has not noticed any significant weight gain at home. He has been attempting to maintain a low sodium diet but he reports that this has been quite difficult for him, butt he is working on trying to improve this.  03/20/2016: Travis Tran was seen today by Waynard Reeds, PharmD. He was here as a follow-up to his recent medication changes for congestive heart failure. He has a history of coronary artery disease with coronary artery bypass grafting. Earlier this week he had symptoms that are consistent with a TIA. He mentioned these symptoms to our pharmacist and I was asked to see him.  He has a ejection fraction of 10%. He was with a friend this week. He had some tingling in his arms / legs. He tried to tell us friend what was going on any was not able to speak. The symptoms lasted about 20 minutes. They resolved completely and he did not go to the emergency room. He presents today and denies any CP or  shortness breath.    Current Outpatient Prescriptions  Medication Sig Dispense Refill  . aspirin 81 MG EC tablet Take 1 tablet (81 mg total) by mouth daily. 30 tablet 2  . atorvastatin (LIPITOR) 80 MG tablet Take 1 tablet (80 mg total) by mouth daily at 6 PM. 30 tablet 2  . budesonide-formoterol (SYMBICORT) 80-4.5 MCG/ACT inhaler Inhale 2 puffs into the lungs 2 (two) times daily.    . carvedilol (COREG) 6.25 MG tablet Take 1 tablet (6.25 mg total) by mouth 2 (two) times daily with a meal. 60 tablet  2  . furosemide (LASIX) 40 MG tablet Take 1 tablet (40 mg total) by mouth 2 (two) times daily. 60 tablet 2  . insulin aspart (NOVOLOG) 100 UNIT/ML injection Inject 5 Units into the skin 3 (three) times daily with meals. 10 mL 2  . insulin glargine (LANTUS) 100 UNIT/ML injection Inject 0.15 mLs (15 Units total) into the skin at bedtime. 10 mL 11  . INSULIN SYRINGE .5CC/29G 29G X 1/2" 0.5 ML MISC Use one syringe per insulin injection. 100 each 11  . lisinopril (PRINIVIL,ZESTRIL)  2.5 MG tablet Take 1 tablet (2.5 mg total) by mouth daily. 30 tablet 2  . pantoprazole (PROTONIX) 40 MG tablet Take 1 tablet (40 mg total) by mouth daily. 30 tablet 2  . spironolactone (ALDACTONE) 25 MG tablet Take 0.5 tablets (12.5 mg total) by mouth daily. 180 tablet 3   No current facility-administered medications for this visit.    Allergies  Allergen Reactions  . Celecoxib Swelling  . Sulfa Antibiotics Other (See Comments)    unspecified    Social History   Social History  . Marital Status: Single    Spouse Name: N/A  . Number of Children: N/A  . Years of Education: N/A   Occupational History  . Not on file.   Social History Main Topics  . Smoking status: Never Smoker   . Smokeless tobacco: Never Used  . Alcohol Use: No     Comment: 02/27/2016 "If I have 3 drinks/year, that's alot"  . Drug Use: Yes    Special: Marijuana     Comment: 02/27/2016 "2-3 times/week"  . Sexual Activity: Not Currently   Other Topics Concern  . Not on file   Social History Narrative     Review of Systems: General: negative for chills, fever, night sweats or weight changes.  Cardiovascular: negative for chest pain, dyspnea on exertion, edema, orthopnea, palpitations, paroxysmal nocturnal dyspnea or shortness of breath Dermatological: negative for rash Respiratory: negative for cough or wheezing Urologic: negative for hematuria Abdominal: negative for nausea, vomiting, diarrhea, bright red blood per rectum, melena, or hematemesis Neurologic: negative for visual changes, syncope, or dizziness All other systems reviewed and are otherwise negative except as noted above.    There were no vitals taken for this visit.  General appearance: alert, cooperative and no distress Neck: no carotid bruit and no JVD Lungs: clear to auscultation bilaterally Heart: regular rate and rhythm, S1, S2 normal, no murmur, click, rub or gallop Extremities: 2+ bilateral LEE Pulses: 2+ and symmetric Skin:  warm and dry Neurologic: Grossly normal  EKG not performed  ASSESSMENT AND PLAN:    1. Chronic Combined Systolic and Diastolic: improved significantly, but still with 2+ bilateral pitting edema which he reports has been fairly constant since discharge. Mild DOE and 2 pillow orthopnea which has also remained constant. He denies any worsening symptoms. No resting dyspnea. We will continue Lasix, 40 mg BID and will add spironolactone, 12.5 mg daily. Check BMP today and recheck again in 1 week to reassess renal function and K. Continue Coreg and lisinopril. Unable to adjust doses further today, given needed initiation of spironolactone. Additional medication titration at this time may cause unsafe drop in BP. We dicussed continuation of low sodium diet and daily weights.   2. CAD: severe CAD with no PCI targets. Plan is to continue medical therapy. He denies any CP. Continue ASA, statin, BB and ACE-I.   3. TIA:   I spoke to the on call MD at [facility]  Neuro.   He recommended that we send Mr. Plascencia to the ER for urgent evaluation.     Helma Argyle, Deloris Ping, MD  03/20/2016 10:10 AM    [facility] [address] [place],  [address] [place], [place]  [address] Pager [phone] Phone: [phone]; Fax: [phone]

## 2016-03-20 NOTE — ED Notes (Signed)
Family at bedside. 

## 2016-03-20 NOTE — Progress Notes (Signed)
03/20/2016 Travis Tran   02-Nov-1956  [id number]  Primary Physician Pete Glatter, MD Primary Cardiologist: Dr. Katrinka Blazing   Reason for Visit/CC: BP check/medication titration  HPI:  Travis Tran is a 60 year old male patient of Dr. Katrinka Blazing with hx of situs inversus, CABG X 5 at [facility] in 2011, IDDM, HTN, hyperlipidemia and severe Rheumatoid and psoriatic arthritis who presents to clinic for a blood pressure check and medication titration for recent CHF exacerbation.    He presented to ER 02/27/16 for edema, swelling, DOE and chest pressure. Troponin 0.07>>0.06>>0.07>>0.006, BNP 2978. EKG ST with LAFB, low voltage precordial leads non specific T wave changes lat leads.He was admitted for acute HF. Treated with IV diuretics. 2D echo obtained and showed severe LVEF of 10% + G3DD.  Subsequent cath showed severe bypass graft and native coronary disease without revascularization options. Patient has diffuse small vessel CAD. Widely patent RIMA to LAD followed by diffuse threadlike native LAD; widely patent saphenous vein graft to the right coronary with large but diffusely diseased distal vessels. No PCI performed. Medical therapy was recommended. He was aggressively diuresed with IV Lasix down from 183 pounds on admission to 158 pounds at time of discharge. Transition to 40 mg of by mouth Lasix twice a day. He was also placed on low-dose lisinopril, 2.5 mg daily as well as beta blocker therapy with carvedilol dose of 6.25 mg twice a day. He was also placed on aspirin as well as high intensity statin therapy with 80 mg of Lipitor daily.  He was seen by Robbie Lis, PA on 03/09/16 for post hospital follow up.  He reported that he has done fairly well but still had some mild lower extremity edema and exertional dyspnea.  His BP was on the lower side so she added spironolactone 12.5mg  daily rather than increasing ACE-I or BB.  Labs were checked on 4/14 and stable with addition of spironolactone.    Pt  comes in today for follow up BP check with addition of spironolactone.  Pt states he has been doing okay except for one episode on Saturday.  He states he started having difficulty speaking, slurred speech and unable to form words.  This episode lasted about 20 minutes and then resolved.  After researching his symptoms, he believes he had a TIA.  He did not go to the ER but did check his BP about 30 minutes after the event and it was ~95 mmHg systolic.  His weight has been stable and lower extremity swelling resolved.  He has been feeling better other than a little sluggish yesterday.  DOE improved.  BP at home ~110s systolic when he checks.    Current Outpatient Prescriptions  Medication Sig Dispense Refill  . aspirin 81 MG EC tablet Take 1 tablet (81 mg total) by mouth daily. 30 tablet 2  . atorvastatin (LIPITOR) 80 MG tablet Take 1 tablet (80 mg total) by mouth daily at 6 PM. 30 tablet 2  . budesonide-formoterol (SYMBICORT) 80-4.5 MCG/ACT inhaler Inhale 2 puffs into the lungs 2 (two) times daily.    . carvedilol (COREG) 6.25 MG tablet Take 1 tablet (6.25 mg total) by mouth 2 (two) times daily with a meal. 60 tablet 2  . furosemide (LASIX) 40 MG tablet Take 1 tablet (40 mg total) by mouth 2 (two) times daily. 60 tablet 2  . insulin aspart (NOVOLOG) 100 UNIT/ML injection Inject 5 Units into the skin 3 (three) times daily with meals. 10 mL 2  .  insulin glargine (LANTUS) 100 UNIT/ML injection Inject 0.15 mLs (15 Units total) into the skin at bedtime. 10 mL 11  . INSULIN SYRINGE .5CC/29G 29G X 1/2" 0.5 ML MISC Use one syringe per insulin injection. 100 each 11  . lisinopril (PRINIVIL,ZESTRIL) 2.5 MG tablet Take 1 tablet (2.5 mg total) by mouth daily. 30 tablet 2  . pantoprazole (PROTONIX) 40 MG tablet Take 1 tablet (40 mg total) by mouth daily. 30 tablet 2  . spironolactone (ALDACTONE) 25 MG tablet Take 0.5 tablets (12.5 mg total) by mouth daily. 180 tablet 3   No current facility-administered  medications for this visit.    Allergies  Allergen Reactions  . Celecoxib Swelling  . Sulfa Antibiotics Other (See Comments)    unspecified    Social History   Social History  . Marital Status: Single    Spouse Name: N/A  . Number of Children: N/A  . Years of Education: N/A   Occupational History  . Not on file.   Social History Main Topics  . Smoking status: Never Smoker   . Smokeless tobacco: Never Used  . Alcohol Use: No     Comment: 02/27/2016 "If I have 3 drinks/year, that's alot"  . Drug Use: Yes    Special: Marijuana     Comment: 02/27/2016 "2-3 times/week"  . Sexual Activity: Not Currently   Other Topics Concern  . Not on file   Social History Narrative      ASSESSMENT AND PLAN:    1. TIA- Pt described classic TIA type symptoms.  Reviewed findings with Dr. Elease Hashimoto, DOD.  He evaluated patient and discussed with Dr. Katrinka Blazing and neurology.  Plan to send patient to ER for further evaluation.  Pt is agreeable to this plan.  We have called the ER to alert them patient is on his way.  2.  Chronic Combined Systolic and Diastolic: Pt's symptoms improving.  BP on the lower side.  Will continue current medications.  Pt will need another BMET in 1 week to follow up on addition of spironolactone.    Audrie Lia R PharmD, BCPS, CPP  03/20/2016 8:32 AM
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
## 2016-03-20 NOTE — ED Notes (Signed)
Pt sts episode of aphasia and right sided weakness Saturday afternoon that is now resolved; pt sent here for further eval
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
## 2016-03-20 NOTE — ED Notes (Signed)
Assisted pt to restroom  
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
## 2016-03-20 NOTE — ED Notes (Signed)
Pt transported from CT to MRI. 4M RN made aware that pt would be transported to floor after MRI.

## 2016-03-20 NOTE — ED Notes (Signed)
Hospitalist at the bedside. Report already called. Ct tech trying to take pt to CT for angio gram

## 2016-03-20 NOTE — ED Notes (Signed)
Pt transported to CT and then to 71M.

## 2016-03-21 ENCOUNTER — Observation Stay (HOSPITAL_COMMUNITY): Payer: Medicaid Other

## 2016-03-21 ENCOUNTER — Observation Stay (HOSPITAL_BASED_OUTPATIENT_CLINIC_OR_DEPARTMENT_OTHER): Payer: Medicaid Other

## 2016-03-21 DIAGNOSIS — I5042 Chronic combined systolic (congestive) and diastolic (congestive) heart failure: Secondary | ICD-10-CM

## 2016-03-21 DIAGNOSIS — I5022 Chronic systolic (congestive) heart failure: Secondary | ICD-10-CM

## 2016-03-21 DIAGNOSIS — I63232 Cerebral infarction due to unspecified occlusion or stenosis of left carotid arteries: Secondary | ICD-10-CM

## 2016-03-21 DIAGNOSIS — R001 Bradycardia, unspecified: Secondary | ICD-10-CM | POA: Diagnosis not present

## 2016-03-21 DIAGNOSIS — I429 Cardiomyopathy, unspecified: Secondary | ICD-10-CM

## 2016-03-21 DIAGNOSIS — I639 Cerebral infarction, unspecified: Secondary | ICD-10-CM | POA: Diagnosis not present

## 2016-03-21 DIAGNOSIS — G459 Transient cerebral ischemic attack, unspecified: Secondary | ICD-10-CM | POA: Diagnosis not present

## 2016-03-21 DIAGNOSIS — Z801 Family history of malignant neoplasm of trachea, bronchus and lung: Secondary | ICD-10-CM

## 2016-03-21 DIAGNOSIS — I6521 Occlusion and stenosis of right carotid artery: Secondary | ICD-10-CM | POA: Diagnosis not present

## 2016-03-21 DIAGNOSIS — G451 Carotid artery syndrome (hemispheric): Secondary | ICD-10-CM | POA: Diagnosis not present

## 2016-03-21 DIAGNOSIS — Z9889 Other specified postprocedural states: Secondary | ICD-10-CM

## 2016-03-21 DIAGNOSIS — I2510 Atherosclerotic heart disease of native coronary artery without angina pectoris: Secondary | ICD-10-CM | POA: Diagnosis not present

## 2016-03-21 LAB — BASIC METABOLIC PANEL
ANION GAP: 9 (ref 5–15)
BUN: 23 mg/dL — ABNORMAL HIGH (ref 6–20)
CO2: 27 mmol/L (ref 22–32)
Calcium: 9.1 mg/dL (ref 8.9–10.3)
Chloride: 103 mmol/L (ref 101–111)
Creatinine, Ser: 1 mg/dL (ref 0.61–1.24)
GFR calc Af Amer: >60 mL/min (ref >60)
GFR calc non Af Amer: >60 mL/min (ref >60)
GLUCOSE: 310 mg/dL — AB (ref 65–99)
POTASSIUM: 4 mmol/L (ref 3.5–5.1)
SODIUM: 139 mmol/L (ref 135–145)

## 2016-03-21 LAB — CBC
HEMATOCRIT: 40.5 % (ref 39.0–52.0)
HEMOGLOBIN: 12.5 g/dL — AB (ref 13.0–17.0)
MCH: 25.6 pg — ABNORMAL LOW (ref 26.0–34.0)
MCHC: 30.9 g/dL (ref 30.0–36.0)
MCV: 82.8 fL (ref 78.0–100.0)
Platelets: 259 10*3/uL (ref 150–400)
RBC: 4.89 MIL/uL (ref 4.22–5.81)
RDW: 15.4 % (ref 11.5–15.5)
WBC: 8 10*3/uL (ref 4.0–10.5)

## 2016-03-21 LAB — GLUCOSE, CAPILLARY
Glucose-Capillary: 216 mg/dL — ABNORMAL HIGH (ref 65–99)
Glucose-Capillary: 299 mg/dL — ABNORMAL HIGH (ref 65–99)
Glucose-Capillary: 161 mg/dL — ABNORMAL HIGH (ref 65–99)
Glucose-Capillary: 233 mg/dL — ABNORMAL HIGH (ref 65–99)

## 2016-03-21 LAB — TROPONIN I: Troponin I: 0.03 ng/mL (ref <0.031)

## 2016-03-21 MED ORDER — LIDOCAINE HCL 1 % IJ SOLN
INTRAMUSCULAR | Status: AC
Start: 2016-03-21 — End: 2016-03-21
  Administered 2016-03-21: 15 mL
  Filled 2016-03-21: qty 20

## 2016-03-21 MED ORDER — MIDAZOLAM HCL 2 MG/2ML IJ SOLN
INTRAMUSCULAR | Status: AC
  Filled 2016-03-21: qty 2

## 2016-03-21 MED ORDER — HEPARIN SODIUM (PORCINE) 1000 UNIT/ML IJ SOLN
INTRAMUSCULAR | Status: AC | PRN
  Administered 2016-03-21: 1000 [IU] via INTRAVENOUS

## 2016-03-21 MED ORDER — INSULIN ASPART 100 UNIT/ML ~~LOC~~ SOLN: 3.0000 [IU] | Freq: Three times a day (TID) | SUBCUTANEOUS | Status: DC

## 2016-03-21 MED ORDER — MIDAZOLAM HCL 2 MG/2ML IJ SOLN
INTRAMUSCULAR | Status: AC | PRN
  Administered 2016-03-21: 1 mg via INTRAVENOUS

## 2016-03-21 MED ORDER — FENTANYL CITRATE (PF) 100 MCG/2ML IJ SOLN
INTRAMUSCULAR | Status: AC
  Filled 2016-03-21: qty 2

## 2016-03-21 MED ORDER — SODIUM CHLORIDE 0.9 % IV SOLN
INTRAVENOUS | Status: AC
  Administered 2016-03-21: 15:00:00 via INTRAVENOUS

## 2016-03-21 MED ORDER — IOPAMIDOL (ISOVUE-300) INJECTION 61%
INTRAVENOUS | Status: AC
Start: 2016-03-21 — End: 2016-03-21
  Administered 2016-03-21: 100 mL
  Filled 2016-03-21: qty 150

## 2016-03-21 MED ORDER — INSULIN GLARGINE 100 UNIT/ML ~~LOC~~ SOLN
15.0000 [IU] | Freq: Every day | SUBCUTANEOUS | Status: DC
  Administered 2016-03-21 – 2016-03-22 (×2): 15 [IU] via SUBCUTANEOUS
  Filled 2016-03-21 (×4): qty 0.15

## 2016-03-21 MED ORDER — SODIUM CHLORIDE 0.9 % IV SOLN
INTRAVENOUS | Status: AC | PRN
  Administered 2016-03-21: 10 mL/h via INTRAVENOUS
  Administered 2016-03-21: 75 mL/h via INTRAVENOUS

## 2016-03-21 MED ORDER — HEPARIN SODIUM (PORCINE) 1000 UNIT/ML IJ SOLN
INTRAMUSCULAR | Status: AC
  Filled 2016-03-21: qty 1

## 2016-03-21 MED ORDER — FENTANYL CITRATE (PF) 100 MCG/2ML IJ SOLN
INTRAMUSCULAR | Status: AC | PRN
  Administered 2016-03-21 (×2): 25 ug via INTRAVENOUS

## 2016-03-21 NOTE — Progress Notes (Signed)
rn stated she was going to get pts vitals before transport took pt down to IR.   Pt sitting on side of bed. BP cuff being inflated on left arm.  Pt took phone in left hand and was bending arm. rn took pts arm and straightened it and said "dont move your arm right now".   After blood pressure reading finished pt said "please dont shove my arm down like that again". rn said okay. Transport asked pt his name and birthday. Pt huffed slightly and gave name and birthday.   Enid Derry transport witnessed all this and noted rn was not being rude.

## 2016-03-21 NOTE — Progress Notes (Signed)
STROKE TEAM PROGRESS NOTE   HISTORY OF PRESENT ILLNESS Travis Tran is an 60 y.o. male patient who was brought into the emergency room for further evaluation of an episode which occurred 3 days ago on Saturday 03/17/2016 (time unknown) where he experienced speech symptoms suggestive of motor aphasia, had garbled speech which was incomprehensible and also had some right upper and lower extremity mild weakness and numbness symptoms. The symptoms lasted around 20-30 minutes and then resolved by themselves. He did not seek any medical attention at that time. He was going to see his cardiologist today, and was advised to present to the ER for further evaluation of his symptoms through his cardiology office. At this time, he denies any focal neurological symptoms. No speech or vision problems, no new sensory or motor symptoms in limbs no gait or balance problems or coordination problems. To his knowledge, never had prior strokes. He is only on aspirin 81 mg daily at home. Has severe ischemic cardiomyopathy with latest EF of 10%. Patient was not administered IV t-PA secondary to outside window, nonfocal exam. He was admitted for further evaluation and treatment.   SUBJECTIVE (INTERVAL HISTORY) His son is at the bedside. He has been stable overnight. He wants all tests/prcedures addressed while he is here. "Once I go home, I am not coming back." He denies any prior history of strokes or TIAs.  He has not been to see any doctor in 2 years.  OBJECTIVE Temp:  [97.5 F (36.4 C)-98.4 F (36.9 C)] 98.2 F (36.8 C) (04/19 0500) Pulse Rate:  [75-90] 85 (04/19 0900) Cardiac Rhythm:  [-] Normal sinus rhythm (04/18 1900) Resp:  [14-21] 17 (04/19 0900) BP: (108-139)/(65-91) 123/74 mmHg (04/19 1000) SpO2:  [92 %-100 %] 97 % (04/19 0900) Weight:  [69.718 kg (153 lb 11.2 oz)] 69.718 kg (153 lb 11.2 oz) (04/19 0500)  CBC:   Recent Labs Lab 03/20/16 1052 03/20/16 1125 03/21/16 0242  WBC 10.7*  --  8.0   NEUTROABS 7.2  --   --   HGB 13.2 15.6 12.5*  HCT 42.9 46.0 40.5  MCV 83.0  --  82.8  PLT 314  --  259    Basic Metabolic Panel:   Recent Labs Lab 03/20/16 1052 03/20/16 1125 03/21/16 0242  NA 140 139 139  K 4.4 4.1 4.0  CL 104 101 103  CO2 28  --  27  GLUCOSE 96 84 310*  BUN 25* 29* 23*  CREATININE 1.30* 1.20 1.00  CALCIUM 9.4  --  9.1    Lipid Panel:     Component Value Date/Time   CHOL 191 02/28/2016 0524   TRIG 110 02/28/2016 0524   HDL 49 02/28/2016 0524   CHOLHDL 3.9 02/28/2016 0524   VLDL 22 02/28/2016 0524   LDLCALC 120* 02/28/2016 0524   HgbA1c:  Lab Results  Component Value Date   HGBA1C 15.1* 02/27/2016   Urine Drug Screen: No results found for: LABOPIA, COCAINSCRNUR, LABBENZ, AMPHETMU, THCU, LABBARB    IMAGING  Ct Head Wo Contrast 03/20/2016   No acute intracranial abnormalities. Mild RIGHT maxillary sinus disease.   Ct Angio Head & Neck W/cm &/or Wo/cm 03/20/2016   1. 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 2. 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 Reconstituted flow at the left ICA terminus such that the left MCA and ACA origins remain normal.  3. Very advanced for age atherosclerosis in the head and neck. Additional atherosclerotic moderate or severe stenosis: - Right ICA origin (65%), - Right ICA siphon, - Left Vertebral artery V4 segment, - Right PCA P2 segment (moderate) - Left ACA callosomarginal artery origin. 4.  No cortically based acute infarct identified.   Mr Brain Wo Contrast 03/20/2016   No acute infarct or intracranial hemorrhage. Abnormal appearance of the left internal carotid artery. Please see CT angiogram report. Mild chronic microvascular changes most notable left parietal lobe. Right parietal region small arachnoid cyst may be present as noted above. Moderate mucosal thickening  right maxillary sinus.    PHYSICAL EXAM Middle-age Caucasian male currently not in distress. . Afebrile. Head is nontraumatic. Neck is supple without bruit.    Cardiac exam no murmur or gallop. Lungs are clear to auscultation. Distal pulses are well felt. Neurological Exam ;  Awake  Alert oriented x 3. Normal speech and language.eye movements full without nystagmus.fundi were not visualized. Vision acuity and fields appear normal. Hearing is normal. Palatal movements are normal. Face symmetric. Tongue midline. Normal strength, tone, reflexes and coordination. Normal sensation. Gait deferred. ASSESSMENT/PLAN [name] is a 60 y.o. male with history of situs inversus, DM, HTN, CAD, high cholesterol, s/p CABG x5, and a recent diagnosis of CHF w/known EF 10% presenting with motor aphasia with garbled speech and R sided weakness and numbness 3 days ago. He did not receive IV t-PA due to delay in arrrival.   Left brain TIA in setting of left ICA severe stenosis  MRI  No acute infarct  CTA head and neck  Situs inversus. L ICA high grade stenosis ? Trickle flow reconstituted and terminus. Advanced for age atherosclerosis - right ICA origin 65%  Cerebal angio planned for today - consider stent at time of angio if needed. Dr. Pearlean Brownie to discuss with Dr. Loni Beckwith timing. Patient wants all workup done during this admission - not a surgical candidate secondary to low EF  Carotid Doppler pending   2D Echo  pending   LDL 120 on 02/28/2016  HgbA1c 15.1 on 02/27/2016   Lovenox 40 mg sq daily for VTE prophylaxis Diet NPO time specified Except for: Sips with Meds  aspirin 81 mg daily prior to admission, now on aspirin 81 mg daily and clopidogrel 75 mg daily  Ongoing aggressive stroke risk factor management  Therapy recommendations:  pending   Disposition:  pending   Essential Hypertension  Stable  Permissive hypertension (OK if < 220/120) but gradually normalize in 5-7  days  Hyperlipidemia  Home meds:  lipitor 80, resumed in hospital  LDL 120 on 02/28/2016, goal < 70  Continue statin at discharge  Diabetes type II  HgbA1c 5.1 on 02/27/2016, goal < 7.0  Uncontrolled  Other Stroke Risk Factors  Marijuana use  Coronary artery disease s/p CABG  Other Active Problems  Situs inversus with dextrocardia  Asthma  GERD  RA  Psoriatic arthritis  Anxiety     Hospital day #   Rhoderick Moody [facility] See Amion for Pager information 03/21/2016 11:19 AM  I have personally examined this patient, reviewed notes, independently viewed imaging studies, participated in medical decision making and plan of care. I have made any additions or clarifications directly to the above note. Agree with note above. He presented with transient expressive speech difficulties 3 days prior secondary to Left brainTIA likely from proximal left ICA occlusion .Her CT angiogram shows also significant right ICA stenosis  and along with his low ejection fraction of 10%  puts him at very high risk for recurrent strokes,, TIA, neurological worsening and needs ongoing stroke evaluation including cerebral catheter angiogram to determine if he has any vascular stenosis which is amenable to angioplasty/stenting. He is not a candidate for carotid surgery given his poor cardiac status.I had a long discussion with the patient and his friend at the bedside about his prognosis, plan for evaluation, treatment and answered questions. Recommend aspirin and Plavix  Followed by endovascular revascularization if a treatable lesion is found. Discuss with Dr. Rogelia Boga and Dr. Osa Craver, MD Medical Director [facility] Pager: [phone] 03/21/2016 3:01 PM   To contact Stroke Continuity provider, please refer to [email or web address]. After hours, contact General Neurology

## 2016-03-21 NOTE — Progress Notes (Signed)
VASCULAR LAB PRELIMINARY  PRELIMINARY  PRELIMINARY  PRELIMINARY  Carotid duplex completed.    Preliminary report:  Right 60% to 79% ICA stenosis. Vertebral artery flow is antegrade.Left- Difficult to evaluate the level of stenosis. There is moderate heterogeneous plaque at the bifurcation/proximal ICA. From this point the is diminished "spiked " velocities and loss of diastolic flow. Cannot rule out a more distal obstruction. Vertebral artery flow is antegrade.  Jamarrius Salay, RVS 03/21/2016, 12:29 PM

## 2016-03-21 NOTE — Sedation Documentation (Signed)
Pt's HR decreases to 30-10's and changes rhythm to a ventricular bigeminy with catheter insertion, dr. Corliss Skains made aware, all other VS are stable. Will continue to monitor

## 2016-03-21 NOTE — Progress Notes (Signed)
Subjective: Patient was seen and examined at bedside. Denies having any dizziness/ lightheadedness or headaches. Denies having any focal weakness or numbness. Denies having any CP, SOB, or palpitations. No other complaints.    Objective: Vital signs in last 24 hours: Filed Vitals:   03/21/16 1603 03/21/16 1609 03/21/16 1630 03/21/16 1700  BP:   116/75 136/70  Pulse: 36 36 72 81  Temp:   97.5 F (36.4 C)   TempSrc:   Oral   Resp:   15 16  Weight:      SpO2:   100% 98%   Weight change:   Intake/Output Summary (Last 24 hours) at 03/21/16 1715 Last data filed at 03/21/16 9892  Gross per 24 hour  Intake    120 ml  Output    150 ml  Net    -30 ml   Physical Exam: Constitutional: He is oriented to person, place, and time and well-developed, well-nourished, and in no distress.   Eyes: EOM are normal. Cardiovascular: Normal rate, regular rhythm, normal heart sounds and intact distal pulses.Heart sound auscultated on the right side of the chest.  Pulmonary/Chest: Effort normal. No stridor. Lungs CTAB. No respiratory distress. He has no wheezes.  Abdominal: Soft. He exhibits no distension. There is no tenderness. There is no rebound and no guarding.  Musculoskeletal: He exhibits no edema.  Neurological: He is alert and oriented to person, place, and time.  CN II-XII intact to bedside testing. Strength 5/5 in bilateral upper and lower extremities. Sensation grossly intact in bilateral upper and lower extremities.  Skin: Skin is warm and dry. He is not diaphoretic.   Lab Results: Basic Metabolic Panel:  Recent Labs Lab 03/20/16 1052 03/20/16 1125 03/21/16 0242  NA 140 139 139  K 4.4 4.1 4.0  CL 104 101 103  CO2 28  --  27  GLUCOSE 96 84 310*  BUN 25* 29* 23*  CREATININE 1.30* 1.20 1.00  CALCIUM 9.4  --  9.1   Liver Function Tests:  Recent Labs Lab 03/20/16 1052  AST 20  ALT 17  ALKPHOS 81  BILITOT 0.5  PROT 6.7  ALBUMIN 2.7*   CBC:  Recent Labs Lab  03/20/16 1052 03/20/16 1125 03/21/16 0242  WBC 10.7*  --  8.0  NEUTROABS 7.2  --   --   HGB 13.2 15.6 12.5*  HCT 42.9 46.0 40.5  MCV 83.0  --  82.8  PLT 314  --  259   CBG:  Recent Labs Lab 03/20/16 1050 03/20/16 1705 03/20/16 2138 03/21/16 0608 03/21/16 1637  GLUCAP 94 111* 216* 299* 161*   Coagulation:  Recent Labs Lab 03/20/16 1052  LABPROT 14.2  INR 1.08   Micro Results: No results found for this or any previous visit (from the past 240 hour(s)). Studies/Results: Ct Angio Head W/cm &/or Wo Cm  03/20/2016  CLINICAL DATA:  60 year old male with stroke like symptoms several days ago including right side weakness and slurred speech. Initial encounter. EXAM: CT ANGIOGRAPHY HEAD AND NECK TECHNIQUE: Multidetector CT imaging of the head and neck was performed using the standard protocol during bolus administration of intravenous contrast. Multiplanar CT image reconstructions and MIPs were obtained to evaluate the vascular anatomy. Carotid stenosis measurements (when applicable) are obtained utilizing NASCET criteria, using the distal internal carotid diameter as the denominator. CONTRAST:  50 mL Isovue 370 COMPARISON:  Head CT without contrast 1133 hours today. FINDINGS: CTA NECK Skeleton: Absent dentition. Right TMJ degeneration. Multilevel chronic disc and endplate  degeneration in the spine. Sequela of median sternotomy. No acute osseous abnormality identified. Mucosal thickening and bubbly opacity in the right maxillary sinus. Other neck: In the visualized upper chest there is situs inversus with right-sided arch, mirror image branching. Sequelae of CABG. No superior mediastinal lymphadenopathy. Negative lung apices. Negative thyroid, larynx, pharynx, parapharyngeal spaces, retropharyngeal space, sublingual space, submandibular glands and parotid glands. No cervical lymphadenopathy. Aortic arch: Right side arch with mirror-image branching. Up to moderate for age soft and calcified  plaque at the great vessel origins. No great vessel origin stenosis. Right carotid system: Soft and calcified plaque throughout the anterior right CCA resulting an less than 50 % stenosis with respect to the distal vessel. Soft and calcified plaque at the right carotid bifurcation resulting in 65 % stenosis with respect to the distal vessel (series 404 image 83. Negative cervical right ICA otherwise. Left carotid system: No left CCA origin stenosis. Intermittent calcified plaque predominantly in the anterior left CCA without stenosis. At the left carotid bifurcation there is soft and calcified plaque resulting in less than 50 % stenosis with respect to the distal vessel. However, distal to the bulb there is circumferential soft plaque resulting in a diminutive caliber of the left ICA, which appears moderately to severely stenotic at the C1-C2 level (series 401 image 109). The vessel remains patent to the skullbase. Vertebral arteries:No proximal subclavian artery stenosis. Mild soft and calcified plaque at both vertebral artery origins with up to mild origin stenosis. Otherwise negative fairly codominant vertebral arteries to the skullbase. CTA HEAD Posterior circulation: Calcified right V4 segment plaque with no hemodynamically significant stenosis. As the left vertebral crosses the dura there is soft and calcified plaque resulting in moderate to severe stenosis which coincides with the left PICA origin which remains patent (series 403, image 141). Additional calcified plaque in the left V4 segment with moderate to severe stenosis on image 137. The distal left vertebral artery remains patent to the basilar. No basilar artery stenosis. Patent AICA and SCA origins. Mild irregularity at both PCA origins without stenosis. Posterior communicating arteries are diminutive or absent. Left PCA branches are within normal limits. There is moderate right P2 segment stenosis (series 406, image 22) with otherwise negative right  PCA branches. Anterior circulation: Severe left ICA siphon soft and calcified plaque such that the vessel appears functionally occluded in the siphon to the mid left supraclinoid segment. Reconstituted flow at the left ICA terminus. Normal left MCA and ACA origins. Severe calcified atherosclerosis of the right siphon, especially in the cavernous and supraclinoid segments. Tandem moderate to severe stenosis results. Right ICA terminus remains patent. Normal right MCA and ACA origins. Anterior communicating artery and proximal A2 segments are normal. Distal A2 segment irregularity. There is calcified plaque at the left callosomarginal artery origin with moderate to severe stenosis (series 406, image 24). Left MCA M1 segment, bifurcation, and left MCA branches are within normal limits. Right MCA M1 segment, bifurcation, and right MCA branches are within normal limits. Venous sinuses: Patent. Anatomic variants: Chest situs inversus. Right side aortic arch with mirror image branching. Delayed phase: No cortically based acute infarct identified. No abnormal enhancement identified. IMPRESSION: 1. 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 2. 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 Reconstituted flow at the left ICA terminus such that the left MCA and ACA origins remain normal. 3. Very  advanced for age atherosclerosis in the head and neck. Additional atherosclerotic moderate or severe stenosis: - Right ICA origin (65%), - Right ICA siphon, - Left Vertebral artery V4 segment, - Right PCA P2 segment (moderate) - Left ACA callosomarginal artery origin. 4.  No cortically based acute infarct identified. #2 and other salient findings discussed with Neurology Nandigam, Philis Fendt, MD on 03/20/2016 at 16:06 . Electronically Signed   By: Odessa Fleming M.D.   On: 03/20/2016 16:08    Ct Head Wo Contrast  03/20/2016  CLINICAL DATA:  Episode of aphasia and RIGHT side weakness on Saturday lasting 15-20 minutes, stroke symptoms, history hypertension, type II diabetes mellitus, asthma, coronary artery disease post CABG, hypercholesterolemia EXAM: CT HEAD WITHOUT CONTRAST TECHNIQUE: Contiguous axial images were obtained from the base of the skull through the vertex without intravenous contrast. COMPARISON:  None FINDINGS: Normal ventricular morphology. No midline shift or mass effect. Normal appearance of brain parenchyma. No intracranial hemorrhage, mass lesion or evidence acute infarction. No extra-axial fluid collections. Mucosal thickening and minimal fluid RIGHT maxillary sinus. Visualized paranasal sinuses and mastoid air cells otherwise clear. Atherosclerotic calcifications of internal carotid and vertebral arteries at skullbase. No acute osseous findings. IMPRESSION: No acute intracranial abnormalities. Mild RIGHT maxillary sinus disease. Electronically Signed   By: Ulyses Southward M.D.   On: 03/20/2016 11:43   Ct Angio Neck W/cm &/or Wo/cm  03/20/2016  CLINICAL DATA:  60 year old male with stroke like symptoms several days ago including right side weakness and slurred speech. Initial encounter. EXAM: CT ANGIOGRAPHY HEAD AND NECK TECHNIQUE: Multidetector CT imaging of the head and neck was performed using the standard protocol during bolus administration of intravenous contrast. Multiplanar CT image reconstructions and MIPs were obtained to evaluate the vascular anatomy. Carotid stenosis measurements (when applicable) are obtained utilizing NASCET criteria, using the distal internal carotid diameter as the denominator. CONTRAST:  50 mL Isovue 370 COMPARISON:  Head CT without contrast 1133 hours today. FINDINGS: CTA NECK Skeleton: Absent dentition. Right TMJ degeneration. Multilevel chronic disc and endplate degeneration in the spine. Sequela of median sternotomy. No acute osseous  abnormality identified. Mucosal thickening and bubbly opacity in the right maxillary sinus. Other neck: In the visualized upper chest there is situs inversus with right-sided arch, mirror image branching. Sequelae of CABG. No superior mediastinal lymphadenopathy. Negative lung apices. Negative thyroid, larynx, pharynx, parapharyngeal spaces, retropharyngeal space, sublingual space, submandibular glands and parotid glands. No cervical lymphadenopathy. Aortic arch: Right side arch with mirror-image branching. Up to moderate for age soft and calcified plaque at the great vessel origins. No great vessel origin stenosis. Right carotid system: Soft and calcified plaque throughout the anterior right CCA resulting an less than 50 % stenosis with respect to the distal vessel. Soft and calcified plaque at the right carotid bifurcation resulting in 65 % stenosis with respect to the distal vessel (series 404 image 83. Negative cervical right ICA otherwise. Left carotid system: No left CCA origin stenosis. Intermittent calcified plaque predominantly in the anterior left CCA without stenosis. At the left carotid bifurcation there is soft and calcified plaque resulting in less than 50 % stenosis with respect to the distal vessel. However, distal to the bulb there is circumferential soft plaque resulting in a diminutive caliber of the left ICA, which appears moderately to severely stenotic at the C1-C2 level (series 401 image 109). The vessel remains patent to the skullbase. Vertebral arteries:No proximal subclavian artery stenosis. Mild soft and calcified plaque at both vertebral artery origins  with up to mild origin stenosis. Otherwise negative fairly codominant vertebral arteries to the skullbase. CTA HEAD Posterior circulation: Calcified right V4 segment plaque with no hemodynamically significant stenosis. As the left vertebral crosses the dura there is soft and calcified plaque resulting in moderate to severe stenosis which  coincides with the left PICA origin which remains patent (series 403, image 141). Additional calcified plaque in the left V4 segment with moderate to severe stenosis on image 137. The distal left vertebral artery remains patent to the basilar. No basilar artery stenosis. Patent AICA and SCA origins. Mild irregularity at both PCA origins without stenosis. Posterior communicating arteries are diminutive or absent. Left PCA branches are within normal limits. There is moderate right P2 segment stenosis (series 406, image 22) with otherwise negative right PCA branches. Anterior circulation: Severe left ICA siphon soft and calcified plaque such that the vessel appears functionally occluded in the siphon to the mid left supraclinoid segment. Reconstituted flow at the left ICA terminus. Normal left MCA and ACA origins. Severe calcified atherosclerosis of the right siphon, especially in the cavernous and supraclinoid segments. Tandem moderate to severe stenosis results. Right ICA terminus remains patent. Normal right MCA and ACA origins. Anterior communicating artery and proximal A2 segments are normal. Distal A2 segment irregularity. There is calcified plaque at the left callosomarginal artery origin with moderate to severe stenosis (series 406, image 24). Left MCA M1 segment, bifurcation, and left MCA branches are within normal limits. Right MCA M1 segment, bifurcation, and right MCA branches are within normal limits. Venous sinuses: Patent. Anatomic variants: Chest situs inversus. Right side aortic arch with mirror image branching. Delayed phase: No cortically based acute infarct identified. No abnormal enhancement identified. IMPRESSION: 1. 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 2. 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 Reconstituted flow at the left ICA terminus such  that the left MCA and ACA origins remain normal. 3. Very advanced for age atherosclerosis in the head and neck. Additional atherosclerotic moderate or severe stenosis: - Right ICA origin (65%), - Right ICA siphon, - Left Vertebral artery V4 segment, - Right PCA P2 segment (moderate) - Left ACA callosomarginal artery origin. 4.  No cortically based acute infarct identified. #2 and other salient findings discussed with Neurology Nandigam, Philis Fendt, MD on 03/20/2016 at 16:06 . Electronically Signed   By: Odessa Fleming M.D.   On: 03/20/2016 16:08   Mr Brain Wo Contrast  03/20/2016  CLINICAL DATA:  60 year old diabetic hypertensive male with episode of aphasia and right-sided weakness. Subsequent encounter. EXAM: MRI HEAD WITHOUT CONTRAST TECHNIQUE: Multiplanar, multiecho pulse sequences of the brain and surrounding structures were obtained without intravenous contrast. COMPARISON:  CT angiogram head and neck 03/20/2016 FINDINGS: No acute infarct or intracranial hemorrhage. Abnormal appearance of the left internal carotid artery. Please see CT angiogram report. Mild chronic microvascular changes most notable left parietal lobe. Right parietal region slightly prominent cerebral spinal fluid space. This measures 2.8 x 2 x 0.9 cm and may represent a small arachnoid cyst given the slight thinning of the adjacent inner cortex of the calvarium. Adjacent prominent sulcus without other findings to suggest this is related to schizencephaly. No hydrocephalus. Cervical medullary junction unremarkable. Slightly small pituitary gland. Moderate mucosal thickening right maxillary sinus. IMPRESSION: No acute infarct or intracranial hemorrhage. Abnormal appearance of the left internal carotid artery. Please see CT  angiogram report. Mild chronic microvascular changes most notable left parietal lobe. Right parietal region small arachnoid cyst may be present as noted above. Moderate mucosal thickening right maxillary sinus.  Electronically Signed   By: Lacy Duverney M.D.   On: 03/20/2016 16:56   Medications: I have reviewed the patient's current medications. Scheduled Meds: . aspirin EC  81 mg Oral Daily  . atorvastatin  80 mg Oral q1800  . clopidogrel  75 mg Oral Daily  . enoxaparin (LOVENOX) injection  40 mg Subcutaneous Q24H  . fentaNYL      . furosemide  40 mg Oral BID  . heparin      . insulin aspart  0-9 Units Subcutaneous TID WC  . insulin aspart  3 Units Subcutaneous TID WC  . insulin glargine  15 Units Subcutaneous QHS  . lisinopril  2.5 mg Oral Daily  . midazolam      . mometasone-formoterol  2 puff Inhalation BID  . pantoprazole  40 mg Oral Daily  . sodium chloride flush  3 mL Intravenous Q12H  . spironolactone  12.5 mg Oral Daily   Continuous Infusions: . sodium chloride 75 mL/hr at 03/21/16 1521   PRN Meds:. 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 RV normal size. No PFO or R-->L shunt identified. Interventional radiology is planning on doing right carotid angioplasty with stent placement tomorrow.  - Tele - Plavix 300 mg once today, then continue Plavix 75 mg daily starting tomorrow  -Continue Aspirin 81 mg daily  - Atorvastatin 80 mg daily -P2Y12 level pending  - PT/OT  Intermittent Bradycardia s/p cerebral angiogram on 03/21/16 EKG and troponin checked yesterday did not show any acute ischemic changes. No further episodes of bradycardia noted on telemetry today.  - Monitor closely on tele - Restart Carvedilol 6.25 mg BID  Combined CHF/CAD: EF 10% and grade 3 diastolic dysfunction in March 2130.Euvolemic at  present. Will continue his current heart failure medications. - Plavix as above  - Atorvastatin 80 mg daily  - Carvedilol 6.25 mg BID - Lisinopril 2.5 mg daily  - Spironolactone 12.5 mg daily  - Lasix 40 mg BID  DMII, uncontrolled: A1c 15.1% during last admission. He was started on Lantus 15 U qHS and Novolog 3 U qAC TID. - Lantus 15 U qHS - Novolog 3u TID - SSI- sensitive   CKD2: Cr at baseline. 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 He denies family history of CF. His father had lung cancer. He has never had genetic testing or sperm studies. - CTM  FEN/GI: - HH  DVT Ppx: Lovenox  Dispo: Disposition is deferred at this time, awaiting improvement of current medical problems.  Anticipated discharge in approximately 2-3 day(s).   The patient does have a current PCP (Dawn Marland Mcalpine, MD) and does need an [facility] hospital follow-up appointment after discharge.  The patient does not have transportation limitations that hinder transportation to clinic appointments.  .Services Needed at time of discharge: Y = Yes, Blank = No PT:   OT:   RN:   Equipment:   Other:       John Giovanni, MD 03/21/2016, 5:15  PM

## 2016-03-21 NOTE — Progress Notes (Signed)
IV pump alarm goes off every time pt bends his arm. This is irritating patient a lot. rn offered to place another IV. Pt was happy with that solution.

## 2016-03-21 NOTE — Sedation Documentation (Signed)
No hematoma to Right groin, dressing clean, dry, and intact

## 2016-03-21 NOTE — Progress Notes (Signed)
md made aware of pts episodes of bradycardia

## 2016-03-21 NOTE — Sedation Documentation (Signed)
Patient is resting comfortably. 

## 2016-03-21 NOTE — Evaluation (Signed)
Physical Therapy Evaluation and Discharge Patient Details Name: Travis Tran MRN: [id number] DOB: 21-Jun-1956 Today's Date: 03/21/2016   History of Present Illness  Travis Tran is a 60 year old man with a history of situs inversus, DM, HTN, CAD, high cholesterol, s/p CABG x5, and a recent diagnosis of CHF presenting to the ED per recommendations during a cardiology follow-up on the morning of 4/18 because of an episode of aphasia and right-sided weakness on 4/15. On the afternoon of 4/15, he had a sudden onset of dizziness and right leg weakness that forced him to sit down. He additionally experienced the inability to speak. He could understand words and put sentences together in his head, but his attempts at forming words were unintelligible. This lasted for 20 minutes.  Clinical Impression  Pt functioning at baseline. Pt indep with mobility. Pt denies any residual numbness or weakness. Pt with no further acute PT needs. PT signing off. Please re-consult if needed in future.    Follow Up Recommendations No PT follow up    Equipment Recommendations  None recommended by PT    Recommendations for Other Services       Precautions / Restrictions Precautions Precautions: None Restrictions Weight Bearing Restrictions: No      Mobility  Bed Mobility Overal bed mobility: Independent             General bed mobility comments: HOB flat and no use of bed rail  Transfers Overall transfer level: Independent Equipment used: None             General transfer comment: good technique  Ambulation/Gait Ambulation/Gait assistance: Independent Ambulation Distance (Feet): 200 Feet Assistive device: None Gait Pattern/deviations: WFL(Within Functional Limits) Gait velocity: wfl Gait velocity interpretation: at or above normal speed for age/gender General Gait Details: no episodes of LOB  Stairs Stairs: Yes Stairs assistance: Supervision Stair Management: No rails;Two rails Number of  Stairs: 6 (and 2 steps without handrail to mimic home entry) General stair comments: pt cautious descending due to old knee injury  Wheelchair Mobility    Modified Rankin (Stroke Patients Only) Modified Rankin (Stroke Patients Only) Pre-Morbid Rankin Score: No symptoms Modified Rankin: No significant disability     Balance Overall balance assessment: No apparent balance deficits (not formally assessed)                                           Pertinent Vitals/Pain Pain Assessment: No/denies pain    Home Living Family/patient expects to be discharged to:: Private residence Living Arrangements: Non-relatives/Friends Available Help at Discharge: Friend(s);Available PRN/intermittently (friend works during the day) Type of Home: House Home Access: Stairs to enter Entrance Stairs-Rails: None Secretary/administrator of Steps: 2 Home Layout: One level Home Equipment: None      Prior Function Level of Independence: Independent               Hand Dominance   Dominant Hand: Right    Extremity/Trunk Assessment   Upper Extremity Assessment: Overall WFL for tasks assessed           Lower Extremity Assessment: Overall WFL for tasks assessed      Cervical / Trunk Assessment: Normal  Communication   Communication: No difficulties  Cognition Arousal/Alertness: Awake/alert Behavior During Therapy: WFL for tasks assessed/performed Overall Cognitive Status: Within Functional Limits for tasks assessed  General Comments      Exercises        Assessment/Plan    PT Assessment Patent does not need any further PT services  PT Diagnosis Generalized weakness   PT Problem List    PT Treatment Interventions     PT Goals (Current goals can be found in the Care Plan section) Acute Rehab PT Goals Patient Stated Goal: go home PT Goal Formulation: All assessment and education complete, DC therapy    Frequency      Barriers to discharge        Co-evaluation               End of Session Equipment Utilized During Treatment: Gait belt Activity Tolerance: Patient tolerated treatment well Patient left: in bed;with call bell/phone within reach Nurse Communication: Mobility status (pt independent with mobility)    Functional Assessment Tool Used: clinical judgement Functional Limitation: Mobility: Walking and moving around Mobility: Walking and Moving Around Current Status (W2376): At least 1 percent but less than 20 percent impaired, limited or restricted Mobility: Walking and Moving Around Goal Status [phone]): At least 1 percent but less than 20 percent impaired, limited or restricted Mobility: Walking and Moving Around Discharge Status [phone]): At least 1 percent but less than 20 percent impaired, limited or restricted    Time: 0827-0850 PT Time Calculation (min) (ACUTE ONLY): 23 min   Charges:   PT Evaluation $PT Eval Low Complexity: 1 Procedure PT Treatments $Gait Training: 8-22 mins   PT G Codes:   PT G-Codes **NOT FOR INPATIENT CLASS** Functional Assessment Tool Used: clinical judgement Functional Limitation: Mobility: Walking and moving around Mobility: Walking and Moving Around Current Status (W7371): At least 1 percent but less than 20 percent impaired, limited or restricted Mobility: Walking and Moving Around Goal Status [phone]): At least 1 percent but less than 20 percent impaired, limited or restricted Mobility: Walking and Moving Around Discharge Status [phone]): At least 1 percent but less than 20 percent impaired, limited or restricted    Travis Tran 03/21/2016, 9:06 AM   Lewis Shock, PT, DPT Pager #: [phone] Office #: [phone]

## 2016-03-21 NOTE — Progress Notes (Signed)
Pt back from echo.    Transport here to take pt to IR

## 2016-03-21 NOTE — Progress Notes (Signed)
Subjective: No events overnight. Travis Tran is very agitated today. He is tired of having neuro exams done and wants to be left alone. He said he will leave tomorrow no matter what, and that we'd be lucky if he stays tonight.  Objective: Vital signs in last 24 hours: Filed Vitals:   03/21/16 0100 03/21/16 0300 03/21/16 0500 03/21/16 0900  BP: 122/65 132/77 127/71 131/91  Pulse: 84 84 90 85  Temp:   98.2 F (36.8 C)   TempSrc:   Oral   Resp: 18 18 18 17   Weight:   69.718 kg (153 lb 11.2 oz)   SpO2: 93% 95% 97% 97%   Weight change:   Intake/Output Summary (Last 24 hours) at 03/21/16 1014 Last data filed at 03/21/16 3825  Gross per 24 hour  Intake    360 ml  Output    150 ml  Net    210 ml   Physical Exam: GEN: agitated but NAD, "You're underestimating how little I care" CV: faint heart sounds, no murmurs, dextrocardia noted PULM: CTAB, NWOB GI: normoactive bowel sounds, non-tender, non-distended EXTREMITIES: no edema, warm and dry NEURO: refused neuro exam  Lab Results: CBC Latest Ref Rng 03/21/2016 03/20/2016 03/20/2016  WBC 4.0 - 10.5 K/uL 8.0 - 10.7(H)  Hemoglobin 13.0 - 17.0 g/dL 12.5(L) 15.6 13.2  Hematocrit 39.0 - 52.0 % 40.5 46.0 42.9  Platelets 150 - 400 K/uL 259 - 314   BMP Latest Ref Rng 03/21/2016 03/20/2016 03/20/2016  Glucose 65 - 99 mg/dL 053(Z) 84 96  BUN 6 - 20 mg/dL 76(B) 34(L) 93(X)  Creatinine 0.61 - 1.24 mg/dL 9.02 4.09 7.35(H)  Sodium 135 - 145 mmol/L 139 139 140  Potassium 3.5 - 5.1 mmol/L 4.0 4.1 4.4  Chloride 101 - 111 mmol/L 103 101 104  CO2 22 - 32 mmol/L 27 - 28  Calcium 8.9 - 10.3 mg/dL 9.1 - 9.4   Capillary glucose- 299  Micro Results: No results found for this or any previous visit (from the past 240 hour(s)).   Studies/Results: Ct Angio Head W/cm &/or Wo Cm  03/20/2016  CLINICAL DATA:  60 year old male with stroke like symptoms several days ago including right side weakness and slurred speech. Initial encounter. EXAM: CT ANGIOGRAPHY HEAD  AND NECK TECHNIQUE: Multidetector CT imaging of the head and neck was performed using the standard protocol during bolus administration of intravenous contrast. Multiplanar CT image reconstructions and MIPs were obtained to evaluate the vascular anatomy. Carotid stenosis measurements (when applicable) are obtained utilizing NASCET criteria, using the distal internal carotid diameter as the denominator. CONTRAST:  50 mL Isovue 370 COMPARISON:  Head CT without contrast 1133 hours today. FINDINGS: CTA NECK Skeleton: Absent dentition. Right TMJ degeneration. Multilevel chronic disc and endplate degeneration in the spine. Sequela of median sternotomy. No acute osseous abnormality identified. Mucosal thickening and bubbly opacity in the right maxillary sinus. Other neck: In the visualized upper chest there is situs inversus with right-sided arch, mirror image branching. Sequelae of CABG. No superior mediastinal lymphadenopathy. Negative lung apices. Negative thyroid, larynx, pharynx, parapharyngeal spaces, retropharyngeal space, sublingual space, submandibular glands and parotid glands. No cervical lymphadenopathy. Aortic arch: Right side arch with mirror-image branching. Up to moderate for age soft and calcified plaque at the great vessel origins. No great vessel origin stenosis. Right carotid system: Soft and calcified plaque throughout the anterior right CCA resulting an less than 50 % stenosis with respect to the distal vessel. Soft and calcified plaque at the right carotid bifurcation  resulting in 65 % stenosis with respect to the distal vessel (series 404 image 83. Negative cervical right ICA otherwise. Left carotid system: No left CCA origin stenosis. Intermittent calcified plaque predominantly in the anterior left CCA without stenosis. At the left carotid bifurcation there is soft and calcified plaque resulting in less than 50 % stenosis with respect to the distal vessel. However, distal to the bulb there is  circumferential soft plaque resulting in a diminutive caliber of the left ICA, which appears moderately to severely stenotic at the C1-C2 level (series 401 image 109). The vessel remains patent to the skullbase. Vertebral arteries:No proximal subclavian artery stenosis. Mild soft and calcified plaque at both vertebral artery origins with up to mild origin stenosis. Otherwise negative fairly codominant vertebral arteries to the skullbase. CTA HEAD Posterior circulation: Calcified right V4 segment plaque with no hemodynamically significant stenosis. As the left vertebral crosses the dura there is soft and calcified plaque resulting in moderate to severe stenosis which coincides with the left PICA origin which remains patent (series 403, image 141). Additional calcified plaque in the left V4 segment with moderate to severe stenosis on image 137. The distal left vertebral artery remains patent to the basilar. No basilar artery stenosis. Patent AICA and SCA origins. Mild irregularity at both PCA origins without stenosis. Posterior communicating arteries are diminutive or absent. Left PCA branches are within normal limits. There is moderate right P2 segment stenosis (series 406, image 22) with otherwise negative right PCA branches. Anterior circulation: Severe left ICA siphon soft and calcified plaque such that the vessel appears functionally occluded in the siphon to the mid left supraclinoid segment. Reconstituted flow at the left ICA terminus. Normal left MCA and ACA origins. Severe calcified atherosclerosis of the right siphon, especially in the cavernous and supraclinoid segments. Tandem moderate to severe stenosis results. Right ICA terminus remains patent. Normal right MCA and ACA origins. Anterior communicating artery and proximal A2 segments are normal. Distal A2 segment irregularity. There is calcified plaque at the left callosomarginal artery origin with moderate to severe stenosis (series 406, image 24). Left  MCA M1 segment, bifurcation, and left MCA branches are within normal limits. Right MCA M1 segment, bifurcation, and right MCA branches are within normal limits. Venous sinuses: Patent. Anatomic variants: Chest situs inversus. Right side aortic arch with mirror image branching. Delayed phase: No cortically based acute infarct identified. No abnormal enhancement identified. IMPRESSION: 1. 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 2. 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 Reconstituted flow at the left ICA terminus such that the left MCA and ACA origins remain normal. 3. Very advanced for age atherosclerosis in the head and neck. Additional atherosclerotic moderate or severe stenosis: - Right ICA origin (65%), - Right ICA siphon, - Left Vertebral artery V4 segment, - Right PCA P2 segment (moderate) - Left ACA callosomarginal artery origin. 4.  No cortically based acute infarct identified. #2 and other salient findings discussed with Neurology Nandigam, Philis Fendt, MD on 03/20/2016 at 16:06 . Electronically Signed   By: Odessa Fleming M.D.   On: 03/20/2016 16:08   Ct Head Wo Contrast  03/20/2016  CLINICAL DATA:  Episode of aphasia and RIGHT side weakness on Saturday lasting 15-20 minutes, stroke symptoms, history hypertension, type II diabetes mellitus, asthma, coronary artery disease post CABG, hypercholesterolemia EXAM: CT HEAD WITHOUT CONTRAST TECHNIQUE: Contiguous axial  images were obtained from the base of the skull through the vertex without intravenous contrast. COMPARISON:  None FINDINGS: Normal ventricular morphology. No midline shift or mass effect. Normal appearance of brain parenchyma. No intracranial hemorrhage, mass lesion or evidence acute infarction. No extra-axial fluid collections. Mucosal thickening and minimal fluid RIGHT maxillary sinus.  Visualized paranasal sinuses and mastoid air cells otherwise clear. Atherosclerotic calcifications of internal carotid and vertebral arteries at skullbase. No acute osseous findings. IMPRESSION: No acute intracranial abnormalities. Mild RIGHT maxillary sinus disease. Electronically Signed   By: Ulyses Southward M.D.   On: 03/20/2016 11:43   Ct Angio Neck W/cm &/or Wo/cm  03/20/2016  CLINICAL DATA:  60 year old male with stroke like symptoms several days ago including right side weakness and slurred speech. Initial encounter. EXAM: CT ANGIOGRAPHY HEAD AND NECK TECHNIQUE: Multidetector CT imaging of the head and neck was performed using the standard protocol during bolus administration of intravenous contrast. Multiplanar CT image reconstructions and MIPs were obtained to evaluate the vascular anatomy. Carotid stenosis measurements (when applicable) are obtained utilizing NASCET criteria, using the distal internal carotid diameter as the denominator. CONTRAST:  50 mL Isovue 370 COMPARISON:  Head CT without contrast 1133 hours today. FINDINGS: CTA NECK Skeleton: Absent dentition. Right TMJ degeneration. Multilevel chronic disc and endplate degeneration in the spine. Sequela of median sternotomy. No acute osseous abnormality identified. Mucosal thickening and bubbly opacity in the right maxillary sinus. Other neck: In the visualized upper chest there is situs inversus with right-sided arch, mirror image branching. Sequelae of CABG. No superior mediastinal lymphadenopathy. Negative lung apices. Negative thyroid, larynx, pharynx, parapharyngeal spaces, retropharyngeal space, sublingual space, submandibular glands and parotid glands. No cervical lymphadenopathy. Aortic arch: Right side arch with mirror-image branching. Up to moderate for age soft and calcified plaque at the great vessel origins. No great vessel origin stenosis. Right carotid system: Soft and calcified plaque throughout the anterior right CCA resulting an  less than 50 % stenosis with respect to the distal vessel. Soft and calcified plaque at the right carotid bifurcation resulting in 65 % stenosis with respect to the distal vessel (series 404 image 83. Negative cervical right ICA otherwise. Left carotid system: No left CCA origin stenosis. Intermittent calcified plaque predominantly in the anterior left CCA without stenosis. At the left carotid bifurcation there is soft and calcified plaque resulting in less than 50 % stenosis with respect to the distal vessel. However, distal to the bulb there is circumferential soft plaque resulting in a diminutive caliber of the left ICA, which appears moderately to severely stenotic at the C1-C2 level (series 401 image 109). The vessel remains patent to the skullbase. Vertebral arteries:No proximal subclavian artery stenosis. Mild soft and calcified plaque at both vertebral artery origins with up to mild origin stenosis. Otherwise negative fairly codominant vertebral arteries to the skullbase. CTA HEAD Posterior circulation: Calcified right V4 segment plaque with no hemodynamically significant stenosis. As the left vertebral crosses the dura there is soft and calcified plaque resulting in moderate to severe stenosis which coincides with the left PICA origin which remains patent (series 403, image 141). Additional calcified plaque in the left V4 segment with moderate to severe stenosis on image 137. The distal left vertebral artery remains patent to the basilar. No basilar artery stenosis. Patent AICA and SCA origins. Mild irregularity at both PCA origins without stenosis. Posterior communicating arteries are diminutive or absent. Left PCA branches are within normal limits. There is moderate right P2 segment stenosis (series 406, image  22) with otherwise negative right PCA branches. Anterior circulation: Severe left ICA siphon soft and calcified plaque such that the vessel appears functionally occluded in the siphon to the mid  left supraclinoid segment. Reconstituted flow at the left ICA terminus. Normal left MCA and ACA origins. Severe calcified atherosclerosis of the right siphon, especially in the cavernous and supraclinoid segments. Tandem moderate to severe stenosis results. Right ICA terminus remains patent. Normal right MCA and ACA origins. Anterior communicating artery and proximal A2 segments are normal. Distal A2 segment irregularity. There is calcified plaque at the left callosomarginal artery origin with moderate to severe stenosis (series 406, image 24). Left MCA M1 segment, bifurcation, and left MCA branches are within normal limits. Right MCA M1 segment, bifurcation, and right MCA branches are within normal limits. Venous sinuses: Patent. Anatomic variants: Chest situs inversus. Right side aortic arch with mirror image branching. Delayed phase: No cortically based acute infarct identified. No abnormal enhancement identified. IMPRESSION: 1. 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 2. 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 Reconstituted flow at the left ICA terminus such that the left MCA and ACA origins remain normal. 3. Very advanced for age atherosclerosis in the head and neck. Additional atherosclerotic moderate or severe stenosis: - Right ICA origin (65%), - Right ICA siphon, - Left Vertebral artery V4 segment, - Right PCA P2 segment (moderate) - Left ACA callosomarginal artery origin. 4.  No cortically based acute infarct identified. #2 and other salient findings discussed with Neurology Nandigam, Philis Fendt, MD on 03/20/2016 at 16:06 . Electronically Signed   By: Odessa Fleming M.D.   On: 03/20/2016 16:08   Mr Brain Wo Contrast  03/20/2016  CLINICAL DATA:  60 year old diabetic hypertensive male with episode of aphasia and right-sided weakness. Subsequent encounter.  EXAM: MRI HEAD WITHOUT CONTRAST TECHNIQUE: Multiplanar, multiecho pulse sequences of the brain and surrounding structures were obtained without intravenous contrast. COMPARISON:  CT angiogram head and neck 03/20/2016 FINDINGS: No acute infarct or intracranial hemorrhage. Abnormal appearance of the left internal carotid artery. Please see CT angiogram report. Mild chronic microvascular changes most notable left parietal lobe. Right parietal region slightly prominent cerebral spinal fluid space. This measures 2.8 x 2 x 0.9 cm and may represent a small arachnoid cyst given the slight thinning of the adjacent inner cortex of the calvarium. Adjacent prominent sulcus without other findings to suggest this is related to schizencephaly. No hydrocephalus. Cervical medullary junction unremarkable. Slightly small pituitary gland. Moderate mucosal thickening right maxillary sinus. IMPRESSION: No acute infarct or intracranial hemorrhage. Abnormal appearance of the left internal carotid artery. Please see CT angiogram report. Mild chronic microvascular changes most notable left parietal lobe. Right parietal region small arachnoid cyst may be present as noted above. Moderate mucosal thickening right maxillary sinus. Electronically Signed   By: Lacy Duverney M.D.   On: 03/20/2016 16:56   Medications:  Scheduled Meds: . aspirin EC  81 mg Oral Daily  . atorvastatin  80 mg Oral q1800  . carvedilol  6.25 mg Oral BID WC  . clopidogrel  75 mg Oral Daily  . enoxaparin (LOVENOX) injection  40 mg Subcutaneous Q24H  . furosemide  40 mg Oral BID  . insulin aspart  0-9 Units Subcutaneous TID WC  . insulin aspart  3 Units Subcutaneous TID WC  . insulin glargine  15 Units Subcutaneous QHS  .  lisinopril  2.5 mg Oral Daily  . mometasone-formoterol  2 puff Inhalation BID  . pantoprazole  40 mg Oral Daily  . sodium chloride flush  3 mL Intravenous Q12H  . spironolactone  12.5 mg Oral Daily   Continuous Infusions:  PRN  Meds:. Assessment/Plan: Active Problems:   TIA (transient ischemic attack)  TIA: Right-sided weakness, dizziness, and aphasia for 20 minutes on 4/15. No remaining neuro deficits today, so unlikely a stroke. HPI not indicative of seizure and no history of seizure.Unlikely tumor or fungal mass because CT showed no mass effect or midline shift. ED EKG showed no a fib, but could be transient. MRI brain showed no infarct or intracranial hemorrhage. MRI was positive for left parietal chronic microvascular changes. Right parietal lobe was normal. CT angio of neck showed advanced atherosclerosis for age and 65% occlusion of left internal carotid artery. Left MCA and left ACA origins are intact. PT/OT consults on 4/19 showed no deficits. Donnald Garre - ASA 81 mg daily - 12-lead EKG pending - CT arteriogram on 4/19 - Send home with Loop or Holter monitor to r/u a fib   CHF: Recently diagnosed in March 2017. EF 10%. Says he has been compliant with medicines and follow-up. Continue home meds: - Plavix 75 mg daily - Atorvastatin 80 mg daily - Coreg 6.25 mg daily - Lisinopril 2.5 mg daily - Spironolactone 12.5 mg daily - Lasix 40 mg BID - F/u echo @ 2 months post discharge   Type II DM: It is poorly controlled. Last A1c was 15.1%. He was started on Lantus 15 U qHS and Novolog 3 U qAC TID, last month, so wait to see if new regimen helps. F/u with PCP to see if more acute management changes are needed.   - SSI - Lantus 10 U qHS  CKD2: Cr near baseline. Unclear if AKI.GFR > 60. Cr 1.00 on morning of 4/19. 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 He denies family history of CF. His father had lung cancer. He has never had genetic testing or sperm studies. - CTM  FEN/GI: - HH  DVT Ppx: Lovenox  Dispo: Disposition is deferred at this time, awaiting improvement of current medical problems. Anticipated discharge in approximately 1  day(s).   The patient does have a current PCP (Dawn Marland Mcalpine, MD) and does need an [facility] hospital follow-up appointment after discharge.  The patient does not have transportation limitations that hinder transportation to clinic appointments.  This is a Psychologist, occupational Note.  The care of the patient was discussed with Dr. Loney Loh and the assessment and plan formulated with their assistance.  Please see their attached note for official documentation of the daily encounter.      Elton Sin, Med Student 03/21/2016, 10:14 AM

## 2016-03-21 NOTE — Progress Notes (Signed)
  Date: 03/21/2016  Patient name: Dayna Geurts  Medical record number: [id number]  Date of birth: [birth year]   I have seen and evaluated Rudi Heap and discussed their care with the Residency Team. Mr Polanco was admitted for a TIA W/U. The TIA occurred about 4 days ago and resolved after 20 min. This involve RLE weakness, slurred speech, and dizziness. He went to his heart MD yesterday and was sent to hospital via EMS. 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 He is asymptomatic and does not bc hypoxic.  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  Filed Vitals:   03/21/16 1543 03/21/16 1545  BP:  123/74  Pulse: 36 69  Temp:    Resp:  15  T 97.5 HR 36 - 85 BP 116/75 100%   The pt has been frustrated all day about delays and inability to eat. I visited the pt twice and each time felt that an exam would exacerbate the situation. He has been examined multiple times today.  Assessment and Plan: I have seen and evaluated the patient as outlined above. I agree with the formulated Assessment and Plan as detailed in the residents' note, with the following changes:   1. TIE - his W/U is being completed. 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 His anti-plts are ASA and plavix. He is on a statin.  2. Bradycardia - this only occurred after his carotid angiogram and is likely a SE and will resolve. He will temp hold his BB and observe overnight. Check EKG and Trop due to h/o  CAD.  3. CKD - has received dye loads. Will need to watch Cr.  Burns Spain, MD 4/19/20173:53 PM

## 2016-03-21 NOTE — Sedation Documentation (Signed)
Patient denies pain and is resting comfortably.  

## 2016-03-21 NOTE — Procedures (Signed)
S/P 4 vessel cerebral arteriogram. Rt CFA approach.. Findings. 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 2.Approx 80 % to 85% stenosis of RT ICA petrous -cavernous junction. 3.Approx 75 to 80% stenosis of LT VBJ distal to LT PICA origin.

## 2016-03-21 NOTE — Progress Notes (Signed)
  Echocardiogram 2D Echocardiogram has been performed.  Tye Savoy 03/21/2016, 11:43 AM

## 2016-03-21 NOTE — Progress Notes (Signed)
Spoke with md, pt allowed to have coreg, even without food. rn to hold insulin while pt is NPO
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
## 2016-03-21 NOTE — Sedation Documentation (Signed)
Patient is resting comfortably, pt denies any pain

## 2016-03-21 NOTE — Care Management Note (Signed)
Case Management Note  Patient Details  Name: Travis Tran MRN: [id number] Date of Birth: 1956/07/19  Subjective/Objective:   Patient admitted with TIA. MRI results negative. He is from home with friends.                   Action/Plan: Awaiting PT/OT recs. CM following for discharge disposition.   Expected Discharge Date:                  Expected Discharge Plan:     In-House Referral:     Discharge planning Services     Post Acute Care Choice:    Choice offered to:     DME Arranged:    DME Agency:     HH Arranged:    HH Agency:     Status of Service:  In process, will continue to follow  Medicare Important Message Given:    Date Medicare IM Given:    Medicare IM give by:    Date Additional Medicare IM Given:    Additional Medicare Important Message give by:     If discussed at Long Length of Stay Meetings, dates discussed:    Additional Comments:  Kermit Balo, RN 03/21/2016, 3:36 PM

## 2016-03-21 NOTE — Progress Notes (Signed)
Pt off floor to echo.

## 2016-03-21 NOTE — Progress Notes (Signed)
Pt very agitated, annoyed even when rn giving shift report. Tired of people asking him his name and birthday. rn tried to explain the reason, pt said "if you explain the reason I will get even more annoyed".

## 2016-03-21 NOTE — Progress Notes (Signed)
Neurology stroke team getting ready to go in and see pt. Pt went to bathroom. pts visitor asked "are they going to ask him to do a lot of neuro exams". rn said probably. Visitor said "he's not going to like that".

## 2016-03-21 NOTE — Progress Notes (Signed)
OT Cancellation Note  Patient Details Name: Travis Tran MRN: [id number] DOB: 1956-07-26   Cancelled Treatment:    Reason Eval/Treat Not Completed: OT screened, no needs identified, will sign off. Per PT, pt has returned to baseline and has no acute OT needs; signing off at this time. Please re-consult if needs change. Thank you for this referral.  Gaye Alken M.S., OTR/L Pager: [phone]  03/21/2016, 10:06 AM

## 2016-03-22 DIAGNOSIS — Z01810 Encounter for preprocedural cardiovascular examination: Secondary | ICD-10-CM

## 2016-03-22 DIAGNOSIS — I130 Hypertensive heart and chronic kidney disease with heart failure and stage 1 through stage 4 chronic kidney disease, or unspecified chronic kidney disease: Secondary | ICD-10-CM | POA: Diagnosis present

## 2016-03-22 DIAGNOSIS — I6521 Occlusion and stenosis of right carotid artery: Secondary | ICD-10-CM | POA: Diagnosis present

## 2016-03-22 DIAGNOSIS — E785 Hyperlipidemia, unspecified: Secondary | ICD-10-CM | POA: Diagnosis present

## 2016-03-22 DIAGNOSIS — R4701 Aphasia: Secondary | ICD-10-CM | POA: Diagnosis present

## 2016-03-22 DIAGNOSIS — I5042 Chronic combined systolic (congestive) and diastolic (congestive) heart failure: Secondary | ICD-10-CM | POA: Diagnosis present

## 2016-03-22 DIAGNOSIS — G459 Transient cerebral ischemic attack, unspecified: Secondary | ICD-10-CM | POA: Diagnosis present

## 2016-03-22 DIAGNOSIS — E1165 Type 2 diabetes mellitus with hyperglycemia: Secondary | ICD-10-CM | POA: Diagnosis present

## 2016-03-22 DIAGNOSIS — I63232 Cerebral infarction due to unspecified occlusion or stenosis of left carotid arteries: Secondary | ICD-10-CM | POA: Diagnosis not present

## 2016-03-22 DIAGNOSIS — N182 Chronic kidney disease, stage 2 (mild): Secondary | ICD-10-CM | POA: Diagnosis present

## 2016-03-22 DIAGNOSIS — Z888 Allergy status to other drugs, medicaments and biological substances status: Secondary | ICD-10-CM | POA: Diagnosis not present

## 2016-03-22 DIAGNOSIS — Z794 Long term (current) use of insulin: Secondary | ICD-10-CM | POA: Diagnosis not present

## 2016-03-22 DIAGNOSIS — Q893 Situs inversus: Secondary | ICD-10-CM | POA: Diagnosis not present

## 2016-03-22 DIAGNOSIS — I255 Ischemic cardiomyopathy: Secondary | ICD-10-CM | POA: Diagnosis present

## 2016-03-22 DIAGNOSIS — Z79899 Other long term (current) drug therapy: Secondary | ICD-10-CM | POA: Diagnosis not present

## 2016-03-22 DIAGNOSIS — R471 Dysarthria and anarthria: Secondary | ICD-10-CM | POA: Diagnosis present

## 2016-03-22 DIAGNOSIS — I429 Cardiomyopathy, unspecified: Secondary | ICD-10-CM | POA: Diagnosis not present

## 2016-03-22 DIAGNOSIS — I5022 Chronic systolic (congestive) heart failure: Secondary | ICD-10-CM | POA: Diagnosis not present

## 2016-03-22 DIAGNOSIS — Z881 Allergy status to other antibiotic agents status: Secondary | ICD-10-CM | POA: Diagnosis not present

## 2016-03-22 DIAGNOSIS — E1122 Type 2 diabetes mellitus with diabetic chronic kidney disease: Secondary | ICD-10-CM | POA: Diagnosis present

## 2016-03-22 DIAGNOSIS — F419 Anxiety disorder, unspecified: Secondary | ICD-10-CM | POA: Diagnosis present

## 2016-03-22 DIAGNOSIS — E7800 Pure hypercholesterolemia, unspecified: Secondary | ICD-10-CM | POA: Diagnosis present

## 2016-03-22 DIAGNOSIS — R001 Bradycardia, unspecified: Secondary | ICD-10-CM | POA: Diagnosis not present

## 2016-03-22 DIAGNOSIS — Z951 Presence of aortocoronary bypass graft: Secondary | ICD-10-CM | POA: Diagnosis not present

## 2016-03-22 DIAGNOSIS — Z7982 Long term (current) use of aspirin: Secondary | ICD-10-CM | POA: Diagnosis not present

## 2016-03-22 DIAGNOSIS — I959 Hypotension, unspecified: Secondary | ICD-10-CM | POA: Diagnosis not present

## 2016-03-22 DIAGNOSIS — I25119 Atherosclerotic heart disease of native coronary artery with unspecified angina pectoris: Secondary | ICD-10-CM | POA: Diagnosis not present

## 2016-03-22 DIAGNOSIS — Z7951 Long term (current) use of inhaled steroids: Secondary | ICD-10-CM | POA: Diagnosis not present

## 2016-03-22 DIAGNOSIS — G458 Other transient cerebral ischemic attacks and related syndromes: Secondary | ICD-10-CM | POA: Diagnosis present

## 2016-03-22 DIAGNOSIS — Z22322 Carrier or suspected carrier of Methicillin resistant Staphylococcus aureus: Secondary | ICD-10-CM | POA: Diagnosis not present

## 2016-03-22 DIAGNOSIS — I6529 Occlusion and stenosis of unspecified carotid artery: Secondary | ICD-10-CM | POA: Insufficient documentation

## 2016-03-22 DIAGNOSIS — I2510 Atherosclerotic heart disease of native coronary artery without angina pectoris: Secondary | ICD-10-CM | POA: Diagnosis present

## 2016-03-22 DIAGNOSIS — Z8249 Family history of ischemic heart disease and other diseases of the circulatory system: Secondary | ICD-10-CM | POA: Diagnosis not present

## 2016-03-22 DIAGNOSIS — I6522 Occlusion and stenosis of left carotid artery: Secondary | ICD-10-CM | POA: Diagnosis present

## 2016-03-22 DIAGNOSIS — K219 Gastro-esophageal reflux disease without esophagitis: Secondary | ICD-10-CM | POA: Diagnosis present

## 2016-03-22 DIAGNOSIS — M069 Rheumatoid arthritis, unspecified: Secondary | ICD-10-CM | POA: Diagnosis present

## 2016-03-22 DIAGNOSIS — G451 Carotid artery syndrome (hemispheric): Secondary | ICD-10-CM | POA: Diagnosis not present

## 2016-03-22 LAB — GLUCOSE, CAPILLARY
GLUCOSE-CAPILLARY: 205 mg/dL — AB (ref 65–99)
Glucose-Capillary: 197 mg/dL — ABNORMAL HIGH (ref 65–99)
Glucose-Capillary: 156 mg/dL — ABNORMAL HIGH (ref 65–99)
Glucose-Capillary: 232 mg/dL — ABNORMAL HIGH (ref 65–99)
Glucose-Capillary: 197 mg/dL — ABNORMAL HIGH (ref 65–99)

## 2016-03-22 LAB — BASIC METABOLIC PANEL
ANION GAP: 10 (ref 5–15)
BUN: 18 mg/dL (ref 6–20)
CO2: 25 mmol/L (ref 22–32)
CREATININE: 1.02 mg/dL (ref 0.61–1.24)
Calcium: 8.7 mg/dL — ABNORMAL LOW (ref 8.9–10.3)
Chloride: 102 mmol/L (ref 101–111)
GFR calc Af Amer: >60 mL/min (ref >60)
Glucose, Bld: 246 mg/dL — ABNORMAL HIGH (ref 65–99)
Potassium: 4.3 mmol/L (ref 3.5–5.1)
Sodium: 137 mmol/L (ref 135–145)

## 2016-03-22 LAB — BRAIN NATRIURETIC PEPTIDE: B NATRIURETIC PEPTIDE 5: 1278.1 pg/mL — AB (ref 0.0–100.0)

## 2016-03-22 LAB — TROPONIN I: TROPONIN I: 0.03 ng/mL (ref <0.031)

## 2016-03-22 MED ORDER — CLOPIDOGREL BISULFATE 75 MG PO TABS
300.0000 mg | ORAL_TABLET | Freq: Once | ORAL | Status: AC
  Administered 2016-03-22: 300 mg via ORAL
  Filled 2016-03-22: qty 4

## 2016-03-22 MED ORDER — CARVEDILOL 6.25 MG PO TABS
6.2500 mg | ORAL_TABLET | Freq: Two times a day (BID) | ORAL | Status: AC
  Administered 2016-03-22 – 2016-03-23 (×2): 6.25 mg via ORAL
  Filled 2016-03-22 (×2): qty 1

## 2016-03-22 MED ORDER — NIMODIPINE 30 MG PO CAPS: 0.0000 mg | ORAL_CAPSULE | ORAL | Status: DC

## 2016-03-22 MED ORDER — CLOPIDOGREL BISULFATE 75 MG PO TABS: 75.0000 mg | ORAL_TABLET | Freq: Every day | ORAL | Status: DC

## 2016-03-22 MED ORDER — CLOPIDOGREL BISULFATE 75 MG PO TABS: 300.0000 mg | ORAL_TABLET | Freq: Every day | ORAL | Status: DC

## 2016-03-22 MED ORDER — CARVEDILOL 6.25 MG PO TABS: 6.2500 mg | ORAL_TABLET | Freq: Two times a day (BID) | ORAL | Status: DC

## 2016-03-22 MED ORDER — ENOXAPARIN SODIUM 40 MG/0.4ML ~~LOC~~ SOLN
40.0000 mg | SUBCUTANEOUS | Status: DC
  Administered 2016-03-22: 40 mg via SUBCUTANEOUS
  Filled 2016-03-22: qty 0.4

## 2016-03-22 NOTE — Progress Notes (Signed)
Subjective: Patient was seen and examined at bedside. Denied having any dizziness/ lightheadedness. Denied having any focal weakness or numbness. Stated he was hungry and did not answer any further questions.    Objective: Vital signs in last 24 hours: Filed Vitals:   03/21/16 2217 03/22/16 0125 03/22/16 0400 03/22/16 0529  BP: 112/59 116/57  119/61  Pulse: 81 86  89  Temp: 98.1 F (36.7 C) 98.3 F (36.8 C)  98.1 F (36.7 C)  TempSrc: Oral Oral  Oral  Resp: 16 18  18   Height:   5\' 6"  (1.676 m)   Weight:   70.217 kg (154 lb 12.8 oz)   SpO2: 98% 98%  98%   Weight change: 0.499 kg (1 lb 1.6 oz)  Intake/Output Summary (Last 24 hours) at 03/22/16 0749 Last data filed at 03/22/16 0530  Gross per 24 hour  Intake      3 ml  Output    550 ml  Net   -547 ml   Physical Exam: Constitutional: He is oriented to person, place, and time and well-developed, well-nourished, and in no distress.   Eyes: EOM are normal. Cardiovascular: Normal rate, regular rhythm, normal heart sounds and intact distal pulses.  Heart sounds distant.  Pulmonary/Chest: Effort normal. No stridor. No respiratory distress. He has no wheezes.  Minimal crackles in bilateral bases.  Abdominal: Soft. He exhibits no distension. There is no tenderness. There is no rebound and no guarding.  Musculoskeletal: He exhibits no edema.  Neurological: He is alert and oriented to person, place, and time.  CN II-XII intact to bedside testing. Strength 5/5 in bilateral upper and lower extremities. Sensation grossly intact in bilateral upper and lower extremities.  Skin: Skin is warm and dry. He is not diaphoretic.   Lab Results: Basic Metabolic Panel:  Recent Labs Lab 03/20/16 1052 03/20/16 1125 03/21/16 0242  NA 140 139 139  K 4.4 4.1 4.0  CL 104 101 103  CO2 28  --  27  GLUCOSE 96 84 310*  BUN 25* 29* 23*  CREATININE 1.30* 1.20 1.00  CALCIUM 9.4  --  9.1   Liver Function Tests:  Recent Labs Lab  03/20/16 1052  AST 20  ALT 17  ALKPHOS 81  BILITOT 0.5  PROT 6.7  ALBUMIN 2.7*   CBC:  Recent Labs Lab 03/20/16 1052 03/20/16 1125 03/21/16 0242  WBC 10.7*  --  8.0  NEUTROABS 7.2  --   --   HGB 13.2 15.6 12.5*  HCT 42.9 46.0 40.5  MCV 83.0  --  82.8  PLT 314  --  259   CBG:  Recent Labs Lab 03/20/16 1705 03/20/16 2138 03/21/16 0608 03/21/16 1637 03/21/16 2215 03/22/16 0614  GLUCAP 111* 216* 299* 161* 233* 156*   Coagulation:  Recent Labs Lab 03/20/16 1052  LABPROT 14.2  INR 1.08   Micro Results: No results found for this or any previous visit (from the past 240 hour(s)). Studies/Results: Ct Angio Head W/cm &/or Wo Cm  03/20/2016  CLINICAL DATA:  60 year old male with stroke like symptoms several days ago including right side weakness and slurred speech. Initial encounter. EXAM: CT ANGIOGRAPHY HEAD AND NECK TECHNIQUE: Multidetector CT imaging of the head and neck was performed using the standard protocol during bolus administration of intravenous contrast. Multiplanar CT image reconstructions and MIPs were obtained to evaluate the vascular anatomy. Carotid stenosis measurements (when applicable) are obtained utilizing NASCET criteria, using the distal internal carotid diameter as the denominator. CONTRAST:  50 mL Isovue 370 COMPARISON:  Head CT without contrast 1133 hours today. FINDINGS: CTA NECK Skeleton: Absent dentition. Right TMJ degeneration. Multilevel chronic disc and endplate degeneration in the spine. Sequela of median sternotomy. No acute osseous abnormality identified. Mucosal thickening and bubbly opacity in the right maxillary sinus. Other neck: In the visualized upper chest there is situs inversus with right-sided arch, mirror image branching. Sequelae of CABG. No superior mediastinal lymphadenopathy. Negative lung apices. Negative thyroid, larynx, pharynx, parapharyngeal spaces, retropharyngeal space, sublingual space, submandibular glands and parotid  glands. No cervical lymphadenopathy. Aortic arch: Right side arch with mirror-image branching. Up to moderate for age soft and calcified plaque at the great vessel origins. No great vessel origin stenosis. Right carotid system: Soft and calcified plaque throughout the anterior right CCA resulting an less than 50 % stenosis with respect to the distal vessel. Soft and calcified plaque at the right carotid bifurcation resulting in 65 % stenosis with respect to the distal vessel (series 404 image 83. Negative cervical right ICA otherwise. Left carotid system: No left CCA origin stenosis. Intermittent calcified plaque predominantly in the anterior left CCA without stenosis. At the left carotid bifurcation there is soft and calcified plaque resulting in less than 50 % stenosis with respect to the distal vessel. However, distal to the bulb there is circumferential soft plaque resulting in a diminutive caliber of the left ICA, which appears moderately to severely stenotic at the C1-C2 level (series 401 image 109). The vessel remains patent to the skullbase. Vertebral arteries:No proximal subclavian artery stenosis. Mild soft and calcified plaque at both vertebral artery origins with up to mild origin stenosis. Otherwise negative fairly codominant vertebral arteries to the skullbase. CTA HEAD Posterior circulation: Calcified right V4 segment plaque with no hemodynamically significant stenosis. As the left vertebral crosses the dura there is soft and calcified plaque resulting in moderate to severe stenosis which coincides with the left PICA origin which remains patent (series 403, image 141). Additional calcified plaque in the left V4 segment with moderate to severe stenosis on image 137. The distal left vertebral artery remains patent to the basilar. No basilar artery stenosis. Patent AICA and SCA origins. Mild irregularity at both PCA origins without stenosis. Posterior communicating arteries are diminutive or absent. Left  PCA branches are within normal limits. There is moderate right P2 segment stenosis (series 406, image 22) with otherwise negative right PCA branches. Anterior circulation: Severe left ICA siphon soft and calcified plaque such that the vessel appears functionally occluded in the siphon to the mid left supraclinoid segment. Reconstituted flow at the left ICA terminus. Normal left MCA and ACA origins. Severe calcified atherosclerosis of the right siphon, especially in the cavernous and supraclinoid segments. Tandem moderate to severe stenosis results. Right ICA terminus remains patent. Normal right MCA and ACA origins. Anterior communicating artery and proximal A2 segments are normal. Distal A2 segment irregularity. There is calcified plaque at the left callosomarginal artery origin with moderate to severe stenosis (series 406, image 24). Left MCA M1 segment, bifurcation, and left MCA branches are within normal limits. Right MCA M1 segment, bifurcation, and right MCA branches are within normal limits. Venous sinuses: Patent. Anatomic variants: Chest situs inversus. Right side aortic arch with mirror image branching. Delayed phase: No cortically based acute infarct identified. No abnormal enhancement identified. IMPRESSION: 1. 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 2. 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 Reconstituted flow at the left ICA terminus such that the left MCA and ACA origins remain normal. 3. Very advanced for age atherosclerosis in the head and neck. Additional atherosclerotic moderate or severe stenosis: - Right ICA origin (65%), - Right ICA siphon, - Left Vertebral artery V4 segment, - Right PCA P2 segment (moderate) - Left ACA callosomarginal artery origin. 4.  No cortically based acute infarct identified. #2 and other salient findings discussed with  Neurology Nandigam, Philis Fendt, MD on 03/20/2016 at 16:06 . Electronically Signed   By: Odessa Fleming M.D.   On: 03/20/2016 16:08   Ct Head Wo Contrast  03/20/2016  CLINICAL DATA:  Episode of aphasia and RIGHT side weakness on Saturday lasting 15-20 minutes, stroke symptoms, history hypertension, type II diabetes mellitus, asthma, coronary artery disease post CABG, hypercholesterolemia EXAM: CT HEAD WITHOUT CONTRAST TECHNIQUE: Contiguous axial images were obtained from the base of the skull through the vertex without intravenous contrast. COMPARISON:  None FINDINGS: Normal ventricular morphology. No midline shift or mass effect. Normal appearance of brain parenchyma. No intracranial hemorrhage, mass lesion or evidence acute infarction. No extra-axial fluid collections. Mucosal thickening and minimal fluid RIGHT maxillary sinus. Visualized paranasal sinuses and mastoid air cells otherwise clear. Atherosclerotic calcifications of internal carotid and vertebral arteries at skullbase. No acute osseous findings. IMPRESSION: No acute intracranial abnormalities. Mild RIGHT maxillary sinus disease. Electronically Signed   By: Ulyses Southward M.D.   On: 03/20/2016 11:43   Ct Angio Neck W/cm &/or Wo/cm  03/20/2016  CLINICAL DATA:  60 year old male with stroke like symptoms several days ago including right side weakness and slurred speech. Initial encounter. EXAM: CT ANGIOGRAPHY HEAD AND NECK TECHNIQUE: Multidetector CT imaging of the head and neck was performed using the standard protocol during bolus administration of intravenous contrast. Multiplanar CT image reconstructions and MIPs were obtained to evaluate the vascular anatomy. Carotid stenosis measurements (when applicable) are obtained utilizing NASCET criteria, using the distal internal carotid diameter as the denominator. CONTRAST:  50 mL Isovue 370 COMPARISON:  Head CT without contrast 1133 hours today. FINDINGS: CTA NECK Skeleton: Absent dentition. Right TMJ  degeneration. Multilevel chronic disc and endplate degeneration in the spine. Sequela of median sternotomy. No acute osseous abnormality identified. Mucosal thickening and bubbly opacity in the right maxillary sinus. Other neck: In the visualized upper chest there is situs inversus with right-sided arch, mirror image branching. Sequelae of CABG. No superior mediastinal lymphadenopathy. Negative lung apices. Negative thyroid, larynx, pharynx, parapharyngeal spaces, retropharyngeal space, sublingual space, submandibular glands and parotid glands. No cervical lymphadenopathy. Aortic arch: Right side arch with mirror-image branching. Up to moderate for age soft and calcified plaque at the great vessel origins. No great vessel origin stenosis. Right carotid system: Soft and calcified plaque throughout the anterior right CCA resulting an less than 50 % stenosis with respect to the distal vessel. Soft and calcified plaque at the right carotid bifurcation resulting in 65 % stenosis with respect to the distal vessel (series 404 image 83. Negative cervical right ICA otherwise. Left carotid system: No left CCA origin stenosis. Intermittent calcified plaque predominantly in the anterior left CCA without stenosis. At the left carotid bifurcation there is soft and calcified plaque resulting in less than 50 % stenosis with respect to the distal vessel. However, distal to the bulb there is circumferential soft plaque resulting in a diminutive caliber of the left ICA, which appears moderately to severely stenotic at the C1-C2 level (  series 401 image 109). The vessel remains patent to the skullbase. Vertebral arteries:No proximal subclavian artery stenosis. Mild soft and calcified plaque at both vertebral artery origins with up to mild origin stenosis. Otherwise negative fairly codominant vertebral arteries to the skullbase. CTA HEAD Posterior circulation: Calcified right V4 segment plaque with no hemodynamically significant  stenosis. As the left vertebral crosses the dura there is soft and calcified plaque resulting in moderate to severe stenosis which coincides with the left PICA origin which remains patent (series 403, image 141). Additional calcified plaque in the left V4 segment with moderate to severe stenosis on image 137. The distal left vertebral artery remains patent to the basilar. No basilar artery stenosis. Patent AICA and SCA origins. Mild irregularity at both PCA origins without stenosis. Posterior communicating arteries are diminutive or absent. Left PCA branches are within normal limits. There is moderate right P2 segment stenosis (series 406, image 22) with otherwise negative right PCA branches. Anterior circulation: Severe left ICA siphon soft and calcified plaque such that the vessel appears functionally occluded in the siphon to the mid left supraclinoid segment. Reconstituted flow at the left ICA terminus. Normal left MCA and ACA origins. Severe calcified atherosclerosis of the right siphon, especially in the cavernous and supraclinoid segments. Tandem moderate to severe stenosis results. Right ICA terminus remains patent. Normal right MCA and ACA origins. Anterior communicating artery and proximal A2 segments are normal. Distal A2 segment irregularity. There is calcified plaque at the left callosomarginal artery origin with moderate to severe stenosis (series 406, image 24). Left MCA M1 segment, bifurcation, and left MCA branches are within normal limits. Right MCA M1 segment, bifurcation, and right MCA branches are within normal limits. Venous sinuses: Patent. Anatomic variants: Chest situs inversus. Right side aortic arch with mirror image branching. Delayed phase: No cortically based acute infarct identified. No abnormal enhancement identified. IMPRESSION: 1. 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 2. 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 Reconstituted flow at the left ICA terminus such that the left MCA and ACA origins remain normal. 3. Very advanced for age atherosclerosis in the head and neck. Additional atherosclerotic moderate or severe stenosis: - Right ICA origin (65%), - Right ICA siphon, - Left Vertebral artery V4 segment, - Right PCA P2 segment (moderate) - Left ACA callosomarginal artery origin. 4.  No cortically based acute infarct identified. #2 and other salient findings discussed with Neurology Nandigam, Philis Fendt, MD on 03/20/2016 at 16:06 . Electronically Signed   By: Odessa Fleming M.D.   On: 03/20/2016 16:08   Mr Brain Wo Contrast  03/20/2016  CLINICAL DATA:  60 year old diabetic hypertensive male with episode of aphasia and right-sided weakness. Subsequent encounter. EXAM: MRI HEAD WITHOUT CONTRAST TECHNIQUE: Multiplanar, multiecho pulse sequences of the brain and surrounding structures were obtained without intravenous contrast. COMPARISON:  CT angiogram head and neck 03/20/2016 FINDINGS: No acute infarct or intracranial hemorrhage. Abnormal appearance of the left internal carotid artery. Please see CT angiogram report. Mild chronic microvascular changes most notable left parietal lobe. Right parietal region slightly prominent cerebral spinal fluid space. This measures 2.8 x 2 x 0.9 cm and may represent a small arachnoid cyst given the slight thinning of the adjacent inner cortex of the calvarium. Adjacent prominent sulcus without other findings to suggest this is related to schizencephaly. No hydrocephalus. Cervical medullary junction unremarkable. Slightly  small pituitary gland. Moderate mucosal thickening right maxillary sinus. IMPRESSION: No acute infarct or intracranial hemorrhage. Abnormal appearance of the left internal carotid artery. Please see CT angiogram report. Mild chronic microvascular changes most notable left parietal lobe.  Right parietal region small arachnoid cyst may be present as noted above. Moderate mucosal thickening right maxillary sinus. Electronically Signed   By: Lacy Duverney M.D.   On: 03/20/2016 16:56   Medications: I have reviewed the patient's current medications. Scheduled Meds: . aspirin EC  81 mg Oral Daily  . atorvastatin  80 mg Oral q1800  . clopidogrel  75 mg Oral Daily  . enoxaparin (LOVENOX) injection  40 mg Subcutaneous Q24H  . furosemide  40 mg Oral BID  . insulin aspart  0-9 Units Subcutaneous TID WC  . insulin aspart  3 Units Subcutaneous TID WC  . insulin glargine  15 Units Subcutaneous QHS  . lisinopril  2.5 mg Oral Daily  . mometasone-formoterol  2 puff Inhalation BID  . pantoprazole  40 mg Oral Daily  . sodium chloride flush  3 mL Intravenous Q12H  . spironolactone  12.5 mg Oral Daily   Continuous Infusions:   PRN Meds:. 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 In addition, patient might need endovascular revascularization. He may also benefit from prolonged cardiac monitoring after discharge.  - Tele - Plavix 75 mg daily - Atorvastatin 80 mg daily - Coreg 6.25 mg daily - Lisinopril 2.5 mg daily - Spironolactone 12.5 mg   - Bubble study pending  - PT/OT  Intermittent Bradycardia s/p cerebral angiogram  EKG yesterday did not show any acute ST/ T wave changes. Troponin 0.03. Telemetry was reviewed and did not show any episodes of bradycardia.  - Monitor closely on tele  Combined CHF/CAD: EF 10% and grade 3 diastolic dysfunction in March 8916.Euvolemic at  present. Will continue his current heart failure medications. - Plavix - Atorvastatin - Coreg  - Lisinopril - Spironolactone - Lasix 40 mg BID  DMII, uncontrolled: A1c 15.1% during last admission. He was started on Lantus 15 U qHS and Novolog 3 U qAC TID. - Lantus 10 U qHS - SSI  CKD2: Cr at baseline. 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 He denies family history of CF. His father had lung cancer. He has never had genetic testing or sperm studies. - CTM  FEN/GI: - HH  DVT Ppx: Lovenox  Dispo: Disposition is deferred at this time, awaiting improvement of current medical problems.  Anticipated discharge in approximately 2-3 day(s).   The patient does have a current PCP (Dawn Marland Mcalpine, MD) and does need an [facility] hospital follow-up appointment after discharge.  The patient does not have transportation limitations that hinder transportation to clinic appointments.  .Services Needed at time of discharge: Y = Yes, Blank = No PT:   OT:   RN:   Equipment:   Other:       John Giovanni, MD 03/22/2016, 7:49 AM

## 2016-03-22 NOTE — Progress Notes (Signed)
Subjective: Travis Tran was in a better mood this morning. He was excited to eat his breakfast. I learned what makes him so mad. Make sure he is wearing his glasses for exams, especially finger-to-nose. He hates the cerebellar tests. It makes him feel like he is in kindergarten. I assured him we were only doing them for his benefit. He said he might refuse them later, though he did them for me this morning. He had a little headache last night. No N/V or abdominal pain.  Objective: Vital signs in last 24 hours: Filed Vitals:   03/22/16 0125 03/22/16 0400 03/22/16 0529 03/22/16 0933  BP: 116/57  119/61 114/62  Pulse: 86  89 74  Temp: 98.3 F (36.8 C)  98.1 F (36.7 C) 98.5 F (36.9 C)  TempSrc: Oral  Oral Oral  Resp: 18  18 18   Height:  5\' 6"  (1.676 m)    Weight:  70.217 kg (154 lb 12.8 oz)    SpO2: 98%  98% 97%   Weight change: 0.499 kg (1 lb 1.6 oz)  Intake/Output Summary (Last 24 hours) at 03/22/16 1426 Last data filed at 03/22/16 0530  Gross per 24 hour  Intake      3 ml  Output    550 ml  Net   -547 ml   Physical Exam: GEN: NAD, more pleasant than yesterday CV: RRR, good capillary refill PULM: CTAB, NWOB, no clubbing EXT: no edema, dry, warm NEURO: sensation intact on face and bilateral upper and lower extremities, strength 5/5 bilaterally, CN intact, cerebellar exam normal  Lab Results: Glucose, capillary: 156 @ 0614, 205 @ 1128  BMP Latest Ref Rng 03/22/2016 03/21/2016 03/20/2016  Glucose 65 - 99 mg/dL 478(G) 956(O) 84  BUN 6 - 20 mg/dL 18 13(Y) 86(V)  Creatinine 0.61 - 1.24 mg/dL 7.84 6.96 2.95  Sodium 135 - 145 mmol/L 137 139 139  Potassium 3.5 - 5.1 mmol/L 4.3 4.0 4.1  Chloride 101 - 111 mmol/L 102 103 101  CO2 22 - 32 mmol/L 25 27 -  Calcium 8.9 - 10.3 mg/dL 2.8(U) 9.1 -    Micro Results: No results found for this or any previous visit (from the past 240 hour(s)). Studies/Results: Ct Angio Head W/cm &/or Wo Cm  03/20/2016  CLINICAL DATA:  60 year old male  with stroke like symptoms several days ago including right side weakness and slurred speech. Initial encounter. EXAM: CT ANGIOGRAPHY HEAD AND NECK TECHNIQUE: Multidetector CT imaging of the head and neck was performed using the standard protocol during bolus administration of intravenous contrast. Multiplanar CT image reconstructions and MIPs were obtained to evaluate the vascular anatomy. Carotid stenosis measurements (when applicable) are obtained utilizing NASCET criteria, using the distal internal carotid diameter as the denominator. CONTRAST:  50 mL Isovue 370 COMPARISON:  Head CT without contrast 1133 hours today. FINDINGS: CTA NECK Skeleton: Absent dentition. Right TMJ degeneration. Multilevel chronic disc and endplate degeneration in the spine. Sequela of median sternotomy. No acute osseous abnormality identified. Mucosal thickening and bubbly opacity in the right maxillary sinus. Other neck: In the visualized upper chest there is situs inversus with right-sided arch, mirror image branching. Sequelae of CABG. No superior mediastinal lymphadenopathy. Negative lung apices. Negative thyroid, larynx, pharynx, parapharyngeal spaces, retropharyngeal space, sublingual space, submandibular glands and parotid glands. No cervical lymphadenopathy. Aortic arch: Right side arch with mirror-image branching. Up to moderate for age soft and calcified plaque at the great vessel origins. No great vessel origin stenosis. Right carotid system: Soft and calcified  plaque throughout the anterior right CCA resulting an less than 50 % stenosis with respect to the distal vessel. Soft and calcified plaque at the right carotid bifurcation resulting in 65 % stenosis with respect to the distal vessel (series 404 image 83. Negative cervical right ICA otherwise. Left carotid system: No left CCA origin stenosis. Intermittent calcified plaque predominantly in the anterior left CCA without stenosis. At the left carotid bifurcation there is  soft and calcified plaque resulting in less than 50 % stenosis with respect to the distal vessel. However, distal to the bulb there is circumferential soft plaque resulting in a diminutive caliber of the left ICA, which appears moderately to severely stenotic at the C1-C2 level (series 401 image 109). The vessel remains patent to the skullbase. Vertebral arteries:No proximal subclavian artery stenosis. Mild soft and calcified plaque at both vertebral artery origins with up to mild origin stenosis. Otherwise negative fairly codominant vertebral arteries to the skullbase. CTA HEAD Posterior circulation: Calcified right V4 segment plaque with no hemodynamically significant stenosis. As the left vertebral crosses the dura there is soft and calcified plaque resulting in moderate to severe stenosis which coincides with the left PICA origin which remains patent (series 403, image 141). Additional calcified plaque in the left V4 segment with moderate to severe stenosis on image 137. The distal left vertebral artery remains patent to the basilar. No basilar artery stenosis. Patent AICA and SCA origins. Mild irregularity at both PCA origins without stenosis. Posterior communicating arteries are diminutive or absent. Left PCA branches are within normal limits. There is moderate right P2 segment stenosis (series 406, image 22) with otherwise negative right PCA branches. Anterior circulation: Severe left ICA siphon soft and calcified plaque such that the vessel appears functionally occluded in the siphon to the mid left supraclinoid segment. Reconstituted flow at the left ICA terminus. Normal left MCA and ACA origins. Severe calcified atherosclerosis of the right siphon, especially in the cavernous and supraclinoid segments. Tandem moderate to severe stenosis results. Right ICA terminus remains patent. Normal right MCA and ACA origins. Anterior communicating artery and proximal A2 segments are normal. Distal A2 segment  irregularity. There is calcified plaque at the left callosomarginal artery origin with moderate to severe stenosis (series 406, image 24). Left MCA M1 segment, bifurcation, and left MCA branches are within normal limits. Right MCA M1 segment, bifurcation, and right MCA branches are within normal limits. Venous sinuses: Patent. Anatomic variants: Chest situs inversus. Right side aortic arch with mirror image branching. Delayed phase: No cortically based acute infarct identified. No abnormal enhancement identified. IMPRESSION: 1. 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 2. 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 Reconstituted flow at the left ICA terminus such that the left MCA and ACA origins remain normal. 3. Very advanced for age atherosclerosis in the head and neck. Additional atherosclerotic moderate or severe stenosis: - Right ICA origin (65%), - Right ICA siphon, - Left Vertebral artery V4 segment, - Right PCA P2 segment (moderate) - Left ACA callosomarginal artery origin. 4.  No cortically based acute infarct identified. #2 and other salient findings discussed with Neurology Nandigam, Philis Fendt, MD on 03/20/2016 at 16:06 . Electronically Signed   By: Odessa Fleming M.D.   On: 03/20/2016 16:08   Ct Angio Neck W/cm &/or Wo/cm  03/20/2016  CLINICAL DATA:  60 year old male with stroke like  symptoms several days ago including right side weakness and slurred speech. Initial encounter. EXAM: CT ANGIOGRAPHY HEAD AND NECK TECHNIQUE: Multidetector CT imaging of the head and neck was performed using the standard protocol during bolus administration of intravenous contrast. Multiplanar CT image reconstructions and MIPs were obtained to evaluate the vascular anatomy. Carotid stenosis measurements (when applicable) are obtained utilizing NASCET criteria, using the distal  internal carotid diameter as the denominator. CONTRAST:  50 mL Isovue 370 COMPARISON:  Head CT without contrast 1133 hours today. FINDINGS: CTA NECK Skeleton: Absent dentition. Right TMJ degeneration. Multilevel chronic disc and endplate degeneration in the spine. Sequela of median sternotomy. No acute osseous abnormality identified. Mucosal thickening and bubbly opacity in the right maxillary sinus. Other neck: In the visualized upper chest there is situs inversus with right-sided arch, mirror image branching. Sequelae of CABG. No superior mediastinal lymphadenopathy. Negative lung apices. Negative thyroid, larynx, pharynx, parapharyngeal spaces, retropharyngeal space, sublingual space, submandibular glands and parotid glands. No cervical lymphadenopathy. Aortic arch: Right side arch with mirror-image branching. Up to moderate for age soft and calcified plaque at the great vessel origins. No great vessel origin stenosis. Right carotid system: Soft and calcified plaque throughout the anterior right CCA resulting an less than 50 % stenosis with respect to the distal vessel. Soft and calcified plaque at the right carotid bifurcation resulting in 65 % stenosis with respect to the distal vessel (series 404 image 83. Negative cervical right ICA otherwise. Left carotid system: No left CCA origin stenosis. Intermittent calcified plaque predominantly in the anterior left CCA without stenosis. At the left carotid bifurcation there is soft and calcified plaque resulting in less than 50 % stenosis with respect to the distal vessel. However, distal to the bulb there is circumferential soft plaque resulting in a diminutive caliber of the left ICA, which appears moderately to severely stenotic at the C1-C2 level (series 401 image 109). The vessel remains patent to the skullbase. Vertebral arteries:No proximal subclavian artery stenosis. Mild soft and calcified plaque at both vertebral artery origins with up to mild origin  stenosis. Otherwise negative fairly codominant vertebral arteries to the skullbase. CTA HEAD Posterior circulation: Calcified right V4 segment plaque with no hemodynamically significant stenosis. As the left vertebral crosses the dura there is soft and calcified plaque resulting in moderate to severe stenosis which coincides with the left PICA origin which remains patent (series 403, image 141). Additional calcified plaque in the left V4 segment with moderate to severe stenosis on image 137. The distal left vertebral artery remains patent to the basilar. No basilar artery stenosis. Patent AICA and SCA origins. Mild irregularity at both PCA origins without stenosis. Posterior communicating arteries are diminutive or absent. Left PCA branches are within normal limits. There is moderate right P2 segment stenosis (series 406, image 22) with otherwise negative right PCA branches. Anterior circulation: Severe left ICA siphon soft and calcified plaque such that the vessel appears functionally occluded in the siphon to the mid left supraclinoid segment. Reconstituted flow at the left ICA terminus. Normal left MCA and ACA origins. Severe calcified atherosclerosis of the right siphon, especially in the cavernous and supraclinoid segments. Tandem moderate to severe stenosis results. Right ICA terminus remains patent. Normal right MCA and ACA origins. Anterior communicating artery and proximal A2 segments are normal. Distal A2 segment irregularity. There is calcified plaque at the left callosomarginal artery origin with moderate to severe stenosis (series 406, image 24). Left MCA M1 segment, bifurcation, and left MCA branches are within  normal limits. Right MCA M1 segment, bifurcation, and right MCA branches are within normal limits. Venous sinuses: Patent. Anatomic variants: Chest situs inversus. Right side aortic arch with mirror image branching. Delayed phase: No cortically based acute infarct identified. No abnormal  enhancement identified. IMPRESSION: 1. 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 2. 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 Reconstituted flow at the left ICA terminus such that the left MCA and ACA origins remain normal. 3. Very advanced for age atherosclerosis in the head and neck. Additional atherosclerotic moderate or severe stenosis: - Right ICA origin (65%), - Right ICA siphon, - Left Vertebral artery V4 segment, - Right PCA P2 segment (moderate) - Left ACA callosomarginal artery origin. 4.  No cortically based acute infarct identified. #2 and other salient findings discussed with Neurology Nandigam, Philis Fendt, MD on 03/20/2016 at 16:06 . Electronically Signed   By: Odessa Fleming M.D.   On: 03/20/2016 16:08   Mr Brain Wo Contrast  03/20/2016  CLINICAL DATA:  60 year old diabetic hypertensive male with episode of aphasia and right-sided weakness. Subsequent encounter. EXAM: MRI HEAD WITHOUT CONTRAST TECHNIQUE: Multiplanar, multiecho pulse sequences of the brain and surrounding structures were obtained without intravenous contrast. COMPARISON:  CT angiogram head and neck 03/20/2016 FINDINGS: No acute infarct or intracranial hemorrhage. Abnormal appearance of the left internal carotid artery. Please see CT angiogram report. Mild chronic microvascular changes most notable left parietal lobe. Right parietal region slightly prominent cerebral spinal fluid space. This measures 2.8 x 2 x 0.9 cm and may represent a small arachnoid cyst given the slight thinning of the adjacent inner cortex of the calvarium. Adjacent prominent sulcus without other findings to suggest this is related to schizencephaly. No hydrocephalus. Cervical medullary junction unremarkable. Slightly small pituitary gland. Moderate mucosal thickening right maxillary sinus. IMPRESSION: No acute  infarct or intracranial hemorrhage. Abnormal appearance of the left internal carotid artery. Please see CT angiogram report. Mild chronic microvascular changes most notable left parietal lobe. Right parietal region small arachnoid cyst may be present as noted above. Moderate mucosal thickening right maxillary sinus. Electronically Signed   By: Lacy Duverney M.D.   On: 03/20/2016 16:56   Medications:  Scheduled Meds: . aspirin EC  81 mg Oral Daily  . atorvastatin  80 mg Oral q1800  . carvedilol  6.25 mg Oral BID WC  . [START ON 03/23/2016] clopidogrel  75 mg Oral Daily  . enoxaparin (LOVENOX) injection  40 mg Subcutaneous Q24H  . furosemide  40 mg Oral BID  . insulin aspart  0-9 Units Subcutaneous TID WC  . insulin aspart  3 Units Subcutaneous TID WC  . insulin glargine  15 Units Subcutaneous QHS  . lisinopril  2.5 mg Oral Daily  . mometasone-formoterol  2 puff Inhalation BID  . pantoprazole  40 mg Oral Daily  . sodium chloride flush  3 mL Intravenous Q12H  . spironolactone  12.5 mg Oral Daily   Continuous Infusions:  PRN Meds:. 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 No remaining neuro deficits today, so unlikely a stroke. HPI not indicative of seizure and no history of seizure.Unlikely tumor or fungal mass because CT showed no mass effect or midline shift. ED EKG showed no a fib, but could be transient. MRI brain showed no infarct or intracranial hemorrhage. MRI was positive for left parietal chronic microvascular changes. Right parietal lobe was normal. CT  angio of neck showed advanced atherosclerosis for age and 65% occlusion of left internal carotid artery. Left MCA and left ACA origins are intact. PT/OT consults on 4/19 showed no deficits. Bubble study on 4/19 showed LVEF 15% with diffuse hypokinesis, RV normal size, no PFO ro R--> L shhunt. EKG on 4/19 showed prolonged QT (QT 454/QTc 500) and sinus rhythm with PVCs and PACs. CT arteriogram on 4/19 showed 80-85% right internal carotid occlusion, 75-80% left VBJ occlusion distal to left PICA and at opthalmic artery. IR is planing on doing right carotid angioplasty with stent placement on 4/22 because right side is more salvagable. They are discussing with anesthesia how best to anesthetize him for tomorrow's angioplasty due to HF complications. - Right carotid stent placement on 4/21 - Teley - Plavix 300 mg once on 4/20, then 75 mg starting 4/21 - ASA 81 mg daily - Atorvastatin 80 mg daily - Send home with Loop or Holter monitor to r/u a fib - Check P2Y12 levels  Intermittent Bradycardia s/p cerebral angiogram on 4/19: EKG and troponin checked on 4/19. Troponin was 0.03. EKG showed no ischemic changes, but showed changes as above. Coreg was stopped due to bradycardia. - Teley - Restart Coreg  CHF: Recently diagnosed in March 2017- EF 10% and grade 3 diastolic dysfunction. Says he has been compliant with medicines and follow-up. Continue home meds: - Coreg 6.25 mg BID, hold tomorrow morning before surgery - Lisinopril 2.5 mg daily - Spironolactone 12.5 mg daily - Lasix 40 mg BID - F/u echo @ 2 months post discharge   Type II DM: It is poorly controlled. Last A1c was 15.1%. He was started on Lantus 15 U qHS and Novolog 3 U qAC TID, last month, so wait to see if new regimen helps. He is also on an irregular eating and insulin regimen in the hospital, so that may be contributing. F/u with PCP to see if more acute management changes are needed.  - SSI - Lantus 15 U qHS - Novolog 3U TID  CKD2: Cr  near baseline. Unclear if AKI.GFR > 60. Cr 1.00 on morning of 4/19 and 1.02 on 4/20. 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 He denies family history of CF. His father had lung cancer. He has never had genetic testing or sperm studies. - CTM  FEN/GI: - HH  DVT Ppx: Lovenox  Dispo: Disposition is deferred at this time, awaiting improvement of current medical problems. Anticipated discharge in approximately 1 day(s).   The patient does have a current PCP (Dawn Marland Mcalpine, MD) and does need an [facility] hospital follow-up appointment after discharge.  The patient does not have transportation limitations that hinder transportation to clinic appointments.   This is a Psychologist, occupational Note.  The care of the patient was discussed with Dr. Loney Loh and the assessment and plan formulated with their assistance.  Please see their attached note for official documentation of the daily encounter.     Elton Sin, Med Student 03/22/2016, 2:26 PM

## 2016-03-22 NOTE — Progress Notes (Signed)
STROKE TEAM PROGRESS NOTE   HISTORY OF PRESENT ILLNESS Travis Tran is an 60 y.o. male patient who was brought into the emergency room for further evaluation of an episode which occurred 3 days ago on Saturday 03/17/2016 (time unknown) where he experienced speech symptoms suggestive of motor aphasia, had garbled speech which was incomprehensible and also had some right upper and lower extremity mild weakness and numbness symptoms. The symptoms lasted around 20-30 minutes and then resolved by themselves. He did not seek any medical attention at that time. He was going to see his cardiologist today, and was advised to present to the ER for further evaluation of his symptoms through his cardiology office. At this time, he denies any focal neurological symptoms. No speech or vision problems, no new sensory or motor symptoms in limbs no gait or balance problems or coordination problems. To his knowledge, never had prior strokes. He is only on aspirin 81 mg daily at home. Has severe ischemic cardiomyopathy with latest EF of 10%. Patient was not administered IV t-PA secondary to outside window, nonfocal exam. He was admitted for further evaluation and treatment.   SUBJECTIVE (INTERVAL HISTORY) . He has been stable overnight. 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 OBJECTIVE Temp:  [97.9 F (36.6 C)-98.5 F (36.9 C)] 98.5 F (36.9 C) (04/20 1810) Pulse Rate:  [65-89] 65 (04/20 1441) Cardiac Rhythm:  [-] Normal sinus rhythm (04/20 0700) Resp:  [16-18] 18 (04/20 1810) BP: (112-123)/(57-70) 123/70 mmHg (04/20 1810) SpO2:  [97 %-99 %] 97 % (04/20 1810) Weight:  [154 lb 12.8 oz (70.217 kg)] 154 lb 12.8 oz (70.217 kg) (04/20 0400)  CBC:   Recent Labs Lab 03/20/16 1052 03/20/16 1125 03/21/16 0242  WBC 10.7*  --  8.0  NEUTROABS 7.2  --   --   HGB 13.2 15.6 12.5*  HCT 42.9 46.0 40.5  MCV 83.0  --  82.8  PLT 314  --  259    Basic  Metabolic Panel:   Recent Labs Lab 03/21/16 0242 03/22/16 1207  NA 139 137  K 4.0 4.3  CL 103 102  CO2 27 25  GLUCOSE 310* 246*  BUN 23* 18  CREATININE 1.00 1.02  CALCIUM 9.1 8.7*    Lipid Panel:     Component Value Date/Time   CHOL 191 02/28/2016 0524   TRIG 110 02/28/2016 0524   HDL 49 02/28/2016 0524   CHOLHDL 3.9 02/28/2016 0524   VLDL 22 02/28/2016 0524   LDLCALC 120* 02/28/2016 0524   HgbA1c:  Lab Results  Component Value Date   HGBA1C 15.1* 02/27/2016   Urine Drug Screen: No results found for: LABOPIA, COCAINSCRNUR, LABBENZ, AMPHETMU, THCU, LABBARB    IMAGING  Ct Head Wo Contrast 03/20/2016   No acute intracranial abnormalities. Mild RIGHT maxillary sinus disease.   Ct Angio Head & Neck W/cm &/or Wo/cm 03/20/2016   1. 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 2. 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 Reconstituted flow at the left ICA terminus such that the left MCA and ACA origins remain normal. 3. Very advanced for age atherosclerosis in the head and neck. Additional atherosclerotic moderate or severe stenosis: - Right ICA origin (65%), - Right ICA siphon, - Left Vertebral artery V4 segment, - Right PCA P2 segment (moderate) - Left ACA callosomarginal artery origin. 4.  No cortically based acute infarct identified.   Mr Brain Wo Contrast 03/20/2016   No acute infarct or intracranial hemorrhage. Abnormal appearance of the left internal carotid artery. Please see CT angiogram report. Mild chronic microvascular changes most notable left parietal lobe. Right parietal region small arachnoid cyst may be present as noted above. Moderate mucosal thickening right maxillary sinus.    PHYSICAL EXAM Middle-age Caucasian male currently not in distress. . Afebrile. Head is nontraumatic. Neck is supple without bruit.    Cardiac  exam no murmur or gallop. Lungs are clear to auscultation. Distal pulses are well felt. Neurological Exam ;  Awake  Alert oriented x 3. Normal speech and language.eye movements full without nystagmus.fundi were not visualized. Vision acuity and fields appear normal. Hearing is normal. Palatal movements are normal. Face symmetric. Tongue midline. Normal strength, tone, reflexes and coordination. Normal sensation. Gait deferred. ASSESSMENT/PLAN [name] is a 60 y.o. male with history of situs inversus, DM, HTN, CAD, high cholesterol, s/p CABG x5, and a recent diagnosis of CHF w/known EF 10% presenting with motor aphasia with garbled speech and R sided weakness and numbness 3 days ago. He did not receive IV t-PA due to delay in arrrival.   Left brain TIA in setting of left ICA severe stenosis  MRI  No acute infarct  CTA head and neck  Situs inversus. L ICA high grade stenosis ? Trickle flow reconstituted and terminus. Advanced for age atherosclerosis - right ICA origin 65%  Cerebal angio planned for today - consider stent at time of angio if needed. Dr. Pearlean Brownie to discuss with Dr. Loni Beckwith timing. Patient wants all workup done during this admission - not a surgical candidate secondary to low EF  Carotid Doppler see catheter angio 2D Echo  Left ventricle: The cavity size was normal. Systolic function was  normal. The estimated ejection fraction was 15%. Severe diffuse  hypokinesis with regional variations. There is akinesis of the  entireapical myocardium. There is akinesis of the   mid-apicallateral myocardium.  LDL 120 on 02/28/2016  HgbA1c 15.1 on 02/27/2016   Lovenox 40 mg sq daily for VTE prophylaxis Diet regular Room service appropriate?: Yes; Fluid consistency:: Thin Diet NPO time specified Except for: Sips with Meds  aspirin 81 mg daily prior to admission, now on aspirin 81 mg daily and clopidogrel 75 mg daily  Ongoing aggressive stroke risk factor  management  Therapy recommendations:  pending   Disposition:  pending   Essential Hypertension  Stable  Permissive hypertension (OK if < 220/120) but gradually normalize in 5-7 days  Hyperlipidemia  Home meds:  lipitor 80, resumed in hospital  LDL 120 on 02/28/2016, goal < 70  Continue statin at discharge  Diabetes type II  HgbA1c 5.1 on 02/27/2016, goal < 7.0  Uncontrolled  Other Stroke Risk Factors  Marijuana use  Coronary artery disease s/p CABG  Other Active Problems  Situs inversus with dextrocardia  Asthma  GERD  RA  Psoriatic arthritis  Anxiety     Hospital day #    .Continue aspirin and Plavix   endovascular revascularization tomorrow by Dr. Osa Craver, MD Medical Director [facility] Pager: [phone] 03/22/2016 6:58 PM   To contact Stroke Continuity provider, please refer to [email or web address]. After hours, contact General Neurology
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
## 2016-03-22 NOTE — Progress Notes (Signed)
Patient ID: Travis Tran, male   DOB: Aug 16, 1956, 60 y.o.   MRN: [id number]    Referring Physician(s): Dr. Patsey Berthold  Supervising Physician: [name]  Chief Complaint: Right ICA stenosis  Subjective: Patient is doing well today with no new complaints.  Allergies: Celecoxib and Sulfa antibiotics  Medications: Prior to Admission medications   Medication Sig Start Date End Date Taking? Authorizing Provider  aspirin 81 MG EC tablet Take 1 tablet (81 mg total) by mouth daily. 03/04/16  Yes Carly Arlyce Harman, MD  atorvastatin (LIPITOR) 80 MG tablet Take 1 tablet (80 mg total) by mouth daily at 6 PM. 03/04/16  Yes Carly J Rivet, MD  budesonide-formoterol (SYMBICORT) 80-4.5 MCG/ACT inhaler Inhale 2 puffs into the lungs 2 (two) times daily.   Yes Historical Provider, MD  carvedilol (COREG) 6.25 MG tablet Take 1 tablet (6.25 mg total) by mouth 2 (two) times daily with a meal. 03/04/16  Yes Carly J Rivet, MD  furosemide (LASIX) 40 MG tablet Take 1 tablet (40 mg total) by mouth 2 (two) times daily. 03/04/16  Yes Carly Arlyce Harman, MD  insulin aspart (NOVOLOG) 100 UNIT/ML injection Inject 5 Units into the skin 3 (three) times daily with meals. 03/06/16  Yes Pete Glatter, MD  insulin glargine (LANTUS) 100 UNIT/ML injection Inject 0.15 mLs (15 Units total) into the skin at bedtime. Patient taking differently: Inject 10 Units into the skin at bedtime.  03/04/16  Yes Carly Arlyce Harman, MD  lisinopril (PRINIVIL,ZESTRIL) 2.5 MG tablet Take 1 tablet (2.5 mg total) by mouth daily. 03/04/16  Yes Carly Arlyce Harman, MD  pantoprazole (PROTONIX) 40 MG tablet Take 1 tablet (40 mg total) by mouth daily. 03/04/16  Yes Carly Arlyce Harman, MD  INSULIN SYRINGE .5CC/29G 29G X 1/2" 0.5 ML MISC Use one syringe per insulin injection. 03/04/16   Su Hoff, MD  spironolactone (ALDACTONE) 25 MG tablet Take 0.5 tablets (12.5 mg total) by mouth daily. Patient not taking: Reported on 03/20/2016 03/09/16   Allayne Butcher, PA-C    Vital  Signs: BP 119/61 mmHg  Pulse 89  Temp(Src) 98.1 F (36.7 C) (Oral)  Resp 18  Ht 5\' 6"  (1.676 m)  Wt 154 lb 12.8 oz (70.217 kg)  BMI 25.00 kg/m2  SpO2 98%  Physical Exam: Gen: NAD Heart: regular rate  Lungs: CTAB  Imaging: Ct Angio Head W/cm &/or Wo Cm  03/20/2016  CLINICAL DATA:  60 year old male with stroke like symptoms several days ago including right side weakness and slurred speech. Initial encounter. EXAM: CT ANGIOGRAPHY HEAD AND NECK TECHNIQUE: Multidetector CT imaging of the head and neck was performed using the standard protocol during bolus administration of intravenous contrast. Multiplanar CT image reconstructions and MIPs were obtained to evaluate the vascular anatomy. Carotid stenosis measurements (when applicable) are obtained utilizing NASCET criteria, using the distal internal carotid diameter as the denominator. CONTRAST:  50 mL Isovue 370 COMPARISON:  Head CT without contrast 1133 hours today. FINDINGS: CTA NECK Skeleton: Absent dentition. Right TMJ degeneration. Multilevel chronic disc and endplate degeneration in the spine. Sequela of median sternotomy. No acute osseous abnormality identified. Mucosal thickening and bubbly opacity in the right maxillary sinus. Other neck: In the visualized upper chest there is situs inversus with right-sided arch, mirror image branching. Sequelae of CABG. No superior mediastinal lymphadenopathy. Negative lung apices. Negative thyroid, larynx, pharynx, parapharyngeal spaces, retropharyngeal space, sublingual space, submandibular glands and parotid glands. No cervical lymphadenopathy. Aortic arch: Right side arch with mirror-image branching. Up  to moderate for age soft and calcified plaque at the great vessel origins. No great vessel origin stenosis. Right carotid system: Soft and calcified plaque throughout the anterior right CCA resulting an less than 50 % stenosis with respect to the distal vessel. Soft and calcified plaque at the right  carotid bifurcation resulting in 65 % stenosis with respect to the distal vessel (series 404 image 83. Negative cervical right ICA otherwise. Left carotid system: No left CCA origin stenosis. Intermittent calcified plaque predominantly in the anterior left CCA without stenosis. At the left carotid bifurcation there is soft and calcified plaque resulting in less than 50 % stenosis with respect to the distal vessel. However, distal to the bulb there is circumferential soft plaque resulting in a diminutive caliber of the left ICA, which appears moderately to severely stenotic at the C1-C2 level (series 401 image 109). The vessel remains patent to the skullbase. Vertebral arteries:No proximal subclavian artery stenosis. Mild soft and calcified plaque at both vertebral artery origins with up to mild origin stenosis. Otherwise negative fairly codominant vertebral arteries to the skullbase. CTA HEAD Posterior circulation: Calcified right V4 segment plaque with no hemodynamically significant stenosis. As the left vertebral crosses the dura there is soft and calcified plaque resulting in moderate to severe stenosis which coincides with the left PICA origin which remains patent (series 403, image 141). Additional calcified plaque in the left V4 segment with moderate to severe stenosis on image 137. The distal left vertebral artery remains patent to the basilar. No basilar artery stenosis. Patent AICA and SCA origins. Mild irregularity at both PCA origins without stenosis. Posterior communicating arteries are diminutive or absent. Left PCA branches are within normal limits. There is moderate right P2 segment stenosis (series 406, image 22) with otherwise negative right PCA branches. Anterior circulation: Severe left ICA siphon soft and calcified plaque such that the vessel appears functionally occluded in the siphon to the mid left supraclinoid segment. Reconstituted flow at the left ICA terminus. Normal left MCA and ACA  origins. Severe calcified atherosclerosis of the right siphon, especially in the cavernous and supraclinoid segments. Tandem moderate to severe stenosis results. Right ICA terminus remains patent. Normal right MCA and ACA origins. Anterior communicating artery and proximal A2 segments are normal. Distal A2 segment irregularity. There is calcified plaque at the left callosomarginal artery origin with moderate to severe stenosis (series 406, image 24). Left MCA M1 segment, bifurcation, and left MCA branches are within normal limits. Right MCA M1 segment, bifurcation, and right MCA branches are within normal limits. Venous sinuses: Patent. Anatomic variants: Chest situs inversus. Right side aortic arch with mirror image branching. Delayed phase: No cortically based acute infarct identified. No abnormal enhancement identified. IMPRESSION: 1. 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 2. 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 Reconstituted flow at the left ICA terminus such that the left MCA and ACA origins remain normal. 3. Very advanced for age atherosclerosis in the head and neck. Additional atherosclerotic moderate or severe stenosis: - Right ICA origin (65%), - Right ICA siphon, - Left Vertebral artery V4 segment, - Right PCA P2 segment (moderate) - Left ACA callosomarginal artery origin. 4.  No cortically based acute infarct identified. #2 and other salient findings discussed with Neurology Nandigam, Philis Fendt, MD on 03/20/2016 at 16:06 . Electronically Signed   By: Althea Grimmer.D.  On: 03/20/2016 16:08   Ct Head Wo Contrast  03/20/2016  CLINICAL DATA:  Episode of aphasia and RIGHT side weakness on Saturday lasting 15-20 minutes, stroke symptoms, history hypertension, type II diabetes mellitus, asthma, coronary artery disease post CABG, hypercholesterolemia  EXAM: CT HEAD WITHOUT CONTRAST TECHNIQUE: Contiguous axial images were obtained from the base of the skull through the vertex without intravenous contrast. COMPARISON:  None FINDINGS: Normal ventricular morphology. No midline shift or mass effect. Normal appearance of brain parenchyma. No intracranial hemorrhage, mass lesion or evidence acute infarction. No extra-axial fluid collections. Mucosal thickening and minimal fluid RIGHT maxillary sinus. Visualized paranasal sinuses and mastoid air cells otherwise clear. Atherosclerotic calcifications of internal carotid and vertebral arteries at skullbase. No acute osseous findings. IMPRESSION: No acute intracranial abnormalities. Mild RIGHT maxillary sinus disease. Electronically Signed   By: Ulyses Southward M.D.   On: 03/20/2016 11:43   Ct Angio Neck W/cm &/or Wo/cm  03/20/2016  CLINICAL DATA:  60 year old male with stroke like symptoms several days ago including right side weakness and slurred speech. Initial encounter. EXAM: CT ANGIOGRAPHY HEAD AND NECK TECHNIQUE: Multidetector CT imaging of the head and neck was performed using the standard protocol during bolus administration of intravenous contrast. Multiplanar CT image reconstructions and MIPs were obtained to evaluate the vascular anatomy. Carotid stenosis measurements (when applicable) are obtained utilizing NASCET criteria, using the distal internal carotid diameter as the denominator. CONTRAST:  50 mL Isovue 370 COMPARISON:  Head CT without contrast 1133 hours today. FINDINGS: CTA NECK Skeleton: Absent dentition. Right TMJ degeneration. Multilevel chronic disc and endplate degeneration in the spine. Sequela of median sternotomy. No acute osseous abnormality identified. Mucosal thickening and bubbly opacity in the right maxillary sinus. Other neck: In the visualized upper chest there is situs inversus with right-sided arch, mirror image branching. Sequelae of CABG. No superior mediastinal lymphadenopathy.  Negative lung apices. Negative thyroid, larynx, pharynx, parapharyngeal spaces, retropharyngeal space, sublingual space, submandibular glands and parotid glands. No cervical lymphadenopathy. Aortic arch: Right side arch with mirror-image branching. Up to moderate for age soft and calcified plaque at the great vessel origins. No great vessel origin stenosis. Right carotid system: Soft and calcified plaque throughout the anterior right CCA resulting an less than 50 % stenosis with respect to the distal vessel. Soft and calcified plaque at the right carotid bifurcation resulting in 65 % stenosis with respect to the distal vessel (series 404 image 83. Negative cervical right ICA otherwise. Left carotid system: No left CCA origin stenosis. Intermittent calcified plaque predominantly in the anterior left CCA without stenosis. At the left carotid bifurcation there is soft and calcified plaque resulting in less than 50 % stenosis with respect to the distal vessel. However, distal to the bulb there is circumferential soft plaque resulting in a diminutive caliber of the left ICA, which appears moderately to severely stenotic at the C1-C2 level (series 401 image 109). The vessel remains patent to the skullbase. Vertebral arteries:No proximal subclavian artery stenosis. Mild soft and calcified plaque at both vertebral artery origins with up to mild origin stenosis. Otherwise negative fairly codominant vertebral arteries to the skullbase. CTA HEAD Posterior circulation: Calcified right V4 segment plaque with no hemodynamically significant stenosis. As the left vertebral crosses the dura there is soft and calcified plaque resulting in moderate to severe stenosis which coincides with the left PICA origin which remains patent (series 403, image 141). Additional calcified plaque in the left V4 segment with moderate to severe stenosis on image 137. The  distal left vertebral artery remains patent to the basilar. No basilar artery  stenosis. Patent AICA and SCA origins. Mild irregularity at both PCA origins without stenosis. Posterior communicating arteries are diminutive or absent. Left PCA branches are within normal limits. There is moderate right P2 segment stenosis (series 406, image 22) with otherwise negative right PCA branches. Anterior circulation: Severe left ICA siphon soft and calcified plaque such that the vessel appears functionally occluded in the siphon to the mid left supraclinoid segment. Reconstituted flow at the left ICA terminus. Normal left MCA and ACA origins. Severe calcified atherosclerosis of the right siphon, especially in the cavernous and supraclinoid segments. Tandem moderate to severe stenosis results. Right ICA terminus remains patent. Normal right MCA and ACA origins. Anterior communicating artery and proximal A2 segments are normal. Distal A2 segment irregularity. There is calcified plaque at the left callosomarginal artery origin with moderate to severe stenosis (series 406, image 24). Left MCA M1 segment, bifurcation, and left MCA branches are within normal limits. Right MCA M1 segment, bifurcation, and right MCA branches are within normal limits. Venous sinuses: Patent. Anatomic variants: Chest situs inversus. Right side aortic arch with mirror image branching. Delayed phase: No cortically based acute infarct identified. No abnormal enhancement identified. IMPRESSION: 1. 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 2. 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 Reconstituted flow at the left ICA terminus such that the left MCA and ACA origins remain normal. 3. Very advanced for age atherosclerosis in the head and neck. Additional atherosclerotic moderate or severe stenosis: - Right ICA origin (65%), - Right ICA siphon, - Left Vertebral artery V4 segment, - Right PCA  P2 segment (moderate) - Left ACA callosomarginal artery origin. 4.  No cortically based acute infarct identified. #2 and other salient findings discussed with Neurology Nandigam, Philis Fendt, MD on 03/20/2016 at 16:06 . Electronically Signed   By: Odessa Fleming M.D.   On: 03/20/2016 16:08   Mr Brain Wo Contrast  03/20/2016  CLINICAL DATA:  60 year old diabetic hypertensive male with episode of aphasia and right-sided weakness. Subsequent encounter. EXAM: MRI HEAD WITHOUT CONTRAST TECHNIQUE: Multiplanar, multiecho pulse sequences of the brain and surrounding structures were obtained without intravenous contrast. COMPARISON:  CT angiogram head and neck 03/20/2016 FINDINGS: No acute infarct or intracranial hemorrhage. Abnormal appearance of the left internal carotid artery. Please see CT angiogram report. Mild chronic microvascular changes most notable left parietal lobe. Right parietal region slightly prominent cerebral spinal fluid space. This measures 2.8 x 2 x 0.9 cm and may represent a small arachnoid cyst given the slight thinning of the adjacent inner cortex of the calvarium. Adjacent prominent sulcus without other findings to suggest this is related to schizencephaly. No hydrocephalus. Cervical medullary junction unremarkable. Slightly small pituitary gland. Moderate mucosal thickening right maxillary sinus. IMPRESSION: No acute infarct or intracranial hemorrhage. Abnormal appearance of the left internal carotid artery. Please see CT angiogram report. Mild chronic microvascular changes most notable left parietal lobe. Right parietal region small arachnoid cyst may be present as noted above. Moderate mucosal thickening right maxillary sinus. Electronically Signed   By: Lacy Duverney M.D.   On: 03/20/2016 16:56    Labs:  CBC:  Recent Labs  03/01/16 0606 03/01/16 1623 03/20/16 1052 03/20/16 1125 03/21/16 0242  WBC 8.0 6.9 10.7*  --  8.0  HGB 14.1 13.6 13.2 15.6 12.5*  HCT 45.9 43.3 42.9 46.0  40.5  PLT 224 178 314  --  259    COAGS:  Recent Labs  03/01/16 0606 03/20/16 1052  INR 1.07 1.08  APTT  --  32    BMP:  Recent Labs  03/03/16 0504 03/04/16 0610  03/09/16 1555 03/16/16 0940 03/20/16 1052 03/20/16 1125 03/21/16 0242  NA 138 135  < > 137 138 140 139 139  K 3.6 3.9  < > 5.2 4.5 4.4 4.1 4.0  CL 101 97*  < > 101 101 104 101 103  CO2 31 31  < > 31 30 28   --  27  GLUCOSE 203* 299*  < > 157* 150* 96 84 310*  BUN 19 21*  < > 24 21 25* 29* 23*  CALCIUM 8.3* 8.4*  < > 8.8 8.3* 9.4  --  9.1  CREATININE 1.13 1.23  < > 1.20 1.05 1.30* 1.20 1.00  GFRNONAA >60 >60  --   --   --  59*  --  >60  GFRAA >60 >60  --   --   --  >60  --  >60  < > = values in this interval not displayed.  LIVER FUNCTION TESTS:  Recent Labs  02/27/16 1607 02/29/16 0741 03/20/16 1052  BILITOT 0.8 0.6 0.5  AST 23 20 20   ALT 20 16* 17  ALKPHOS 94 83 81  PROT 5.7* 5.1* 6.7  ALBUMIN 2.6* 2.1* 2.7*    Assessment and Plan: 1. Right ICA stenosis 2. HF with EF of 10-15% -Dr. Corliss Skains would like to proceed with treatment for the patient's ICA stenosis.  This would include angioplasty with stent placement.  This is generally done under GET anesthesia.  However, the patient has an EF of 10-15%.  We will have anesthesia consult on the patient today for their opinion and we will consult Dr. Katrinka Blazing as well, who is his cardiologist.  If we are unable to do this with general anesthesia, then we may have to try this with local anesthesia and some conscious sedation if the patient can lay still enough to complete the procedure. -we will load him with plavix 300mg  once today and then change to 75mg  of plavix daily starting tomorrow -cont ASA 81mg  -NPO p MN -check P2Y12 in am.  Electronically Signed: Derius Ghosh E 03/22/2016, 9:27 AM   I spent a total of 25 Minutes at the the patient's bedside AND on the patient's hospital floor or unit, greater than 50% of which was counseling/coordinating care  for right ICA stneosis

## 2016-03-22 NOTE — Progress Notes (Signed)
RN attempted assessment, asked patient if he was in pain, stated "I can't fucking eat in peace." RN stated assessment would take 5 minutes, patient continued to speak inappropriately towards staff, rn left room. Will attempt to reassess after patient has eaten.

## 2016-03-23 ENCOUNTER — Inpatient Hospital Stay (HOSPITAL_COMMUNITY): Payer: Medicaid Other

## 2016-03-23 ENCOUNTER — Encounter (HOSPITAL_COMMUNITY)
Admission: EM | Disposition: A | Discharge status: Home / Self Care | Payer: Self-pay | Source: Home / Self Care | Attending: Internal Medicine

## 2016-03-23 ENCOUNTER — Inpatient Hospital Stay (HOSPITAL_COMMUNITY): Payer: Medicaid Other | Admitting: Certified Registered Nurse Anesthetist

## 2016-03-23 ENCOUNTER — Encounter (HOSPITAL_COMMUNITY): Payer: Self-pay | Admitting: Certified Registered Nurse Anesthetist

## 2016-03-23 DIAGNOSIS — Z9889 Other specified postprocedural states: Secondary | ICD-10-CM

## 2016-03-23 DIAGNOSIS — Z419 Encounter for procedure for purposes other than remedying health state, unspecified: Secondary | ICD-10-CM

## 2016-03-23 HISTORY — PX: RADIOLOGY WITH ANESTHESIA: SHX6223

## 2016-03-23 LAB — CBC
HCT: 41.3 % (ref 39.0–52.0)
HEMOGLOBIN: 13 g/dL (ref 13.0–17.0)
MCH: 25.8 pg — AB (ref 26.0–34.0)
MCHC: 31.5 g/dL (ref 30.0–36.0)
MCV: 81.9 fL (ref 78.0–100.0)
PLATELETS: 250 10*3/uL (ref 150–400)
RBC: 5.04 MIL/uL (ref 4.22–5.81)
RDW: 15.2 % (ref 11.5–15.5)
WBC: 9.3 10*3/uL (ref 4.0–10.5)

## 2016-03-23 LAB — BASIC METABOLIC PANEL
Anion gap: 9 (ref 5–15)
BUN: 19 mg/dL (ref 6–20)
CO2: 26 mmol/L (ref 22–32)
CREATININE: 0.98 mg/dL (ref 0.61–1.24)
Calcium: 9 mg/dL (ref 8.9–10.3)
Chloride: 101 mmol/L (ref 101–111)
Glucose, Bld: 170 mg/dL — ABNORMAL HIGH (ref 65–99)
POTASSIUM: 3.8 mmol/L (ref 3.5–5.1)
SODIUM: 136 mmol/L (ref 135–145)

## 2016-03-23 LAB — SURGICAL PCR SCREEN
MRSA, PCR: POSITIVE — AB
STAPHYLOCOCCUS AUREUS: POSITIVE — AB

## 2016-03-23 LAB — POCT ACTIVATED CLOTTING TIME
Activated Clotting Time: 214 seconds
Activated Clotting Time: 229 seconds
Activated Clotting Time: 240 seconds

## 2016-03-23 LAB — GLUCOSE, CAPILLARY
GLUCOSE-CAPILLARY: 123 mg/dL — AB (ref 65–99)
Glucose-Capillary: 120 mg/dL — ABNORMAL HIGH (ref 65–99)
Glucose-Capillary: 157 mg/dL — ABNORMAL HIGH (ref 65–99)
Glucose-Capillary: 95 mg/dL (ref 65–99)

## 2016-03-23 LAB — PLATELET INHIBITION P2Y12: Platelet Function  P2Y12: 113 [PRU] — ABNORMAL LOW (ref 194–418)

## 2016-03-23 SURGERY — RADIOLOGY WITH ANESTHESIA
Anesthesia: General

## 2016-03-23 MED ORDER — SODIUM CHLORIDE 0.9 % IV SOLN
INTRAVENOUS | Status: DC
  Administered 2016-03-24: 09:00:00 via INTRAVENOUS

## 2016-03-23 MED ORDER — ONDANSETRON HCL 4 MG/2ML IJ SOLN
INTRAMUSCULAR | Status: DC | PRN
  Administered 2016-03-23: 4 mg via INTRAVENOUS

## 2016-03-23 MED ORDER — ROCURONIUM BROMIDE 100 MG/10ML IV SOLN
INTRAVENOUS | Status: DC | PRN
  Administered 2016-03-23: 10 mg via INTRAVENOUS
  Administered 2016-03-23: 50 mg via INTRAVENOUS

## 2016-03-23 MED ORDER — ACETAMINOPHEN 500 MG PO TABS
1000.0000 mg | ORAL_TABLET | Freq: Four times a day (QID) | ORAL | Status: DC | PRN
  Administered 2016-03-23 – 2016-03-24 (×2): 1000 mg via ORAL
  Filled 2016-03-23 (×2): qty 2

## 2016-03-23 MED ORDER — EPHEDRINE SULFATE 50 MG/ML IJ SOLN
INTRAMUSCULAR | Status: DC | PRN
  Administered 2016-03-23: 10 mg via INTRAVENOUS
  Administered 2016-03-23: 5 mg via INTRAVENOUS

## 2016-03-23 MED ORDER — CEFAZOLIN SODIUM-DEXTROSE 2-4 GM/100ML-% IV SOLN
2.0000 g | INTRAVENOUS | Status: AC
  Administered 2016-03-23: 2 g via INTRAVENOUS

## 2016-03-23 MED ORDER — HEPARIN SODIUM (PORCINE) 1000 UNIT/ML IJ SOLN
INTRAMUSCULAR | Status: DC | PRN
  Administered 2016-03-23: 3 mL via INTRAVENOUS
  Administered 2016-03-23: 2 mL via INTRAVENOUS

## 2016-03-23 MED ORDER — MIDAZOLAM HCL 5 MG/5ML IJ SOLN
INTRAMUSCULAR | Status: DC | PRN
  Administered 2016-03-23: 2 mg via INTRAVENOUS

## 2016-03-23 MED ORDER — NOREPINEPHRINE BITARTRATE 1 MG/ML IV SOLN
0.0000 ug/min | INTRAVENOUS | Status: DC
  Administered 2016-03-23: 1 ug/min via INTRAVENOUS
  Administered 2016-03-23: 2 ug/min via INTRAVENOUS
  Filled 2016-03-23: qty 4

## 2016-03-23 MED ORDER — CEFAZOLIN SODIUM-DEXTROSE 2-3 GM-% IV SOLR
INTRAVENOUS | Status: AC
  Filled 2016-03-23: qty 50

## 2016-03-23 MED ORDER — MUPIROCIN 2 % EX OINT
TOPICAL_OINTMENT | CUTANEOUS | Status: AC
  Administered 2016-03-23: 1
  Filled 2016-03-23: qty 22

## 2016-03-23 MED ORDER — IOPAMIDOL (ISOVUE-300) INJECTION 61%
INTRAVENOUS | Status: AC
  Administered 2016-03-23: 10 mL
  Filled 2016-03-23: qty 50

## 2016-03-23 MED ORDER — LACTATED RINGERS IV SOLN
INTRAVENOUS | Status: DC
  Administered 2016-03-23: 50 mL/h via INTRAVENOUS
  Administered 2016-03-23: 12:00:00 via INTRAVENOUS

## 2016-03-23 MED ORDER — NICARDIPINE HCL IN NACL 20-0.86 MG/200ML-% IV SOLN: 5.0000 mg/h | INTRAVENOUS | Status: DC

## 2016-03-23 MED ORDER — PROTAMINE SULFATE 10 MG/ML IV SOLN
INTRAVENOUS | Status: DC | PRN
Start: 2016-03-23 — End: 2016-03-23
  Administered 2016-03-23: 5 mg via INTRAVENOUS

## 2016-03-23 MED ORDER — PROPOFOL 10 MG/ML IV BOLUS
INTRAVENOUS | Status: DC | PRN
  Administered 2016-03-23 (×2): 50 mg via INTRAVENOUS

## 2016-03-23 MED ORDER — CLOPIDOGREL BISULFATE 75 MG PO TABS
75.0000 mg | ORAL_TABLET | Freq: Every day | ORAL | Status: DC
Start: 2016-03-24 — End: 2016-03-24
  Administered 2016-03-24: 75 mg via ORAL
  Filled 2016-03-23: qty 1

## 2016-03-23 MED ORDER — FENTANYL CITRATE (PF) 100 MCG/2ML IJ SOLN
INTRAMUSCULAR | Status: DC | PRN
  Administered 2016-03-23 (×3): 50 ug via INTRAVENOUS

## 2016-03-23 MED ORDER — LIDOCAINE HCL 1 % IJ SOLN
INTRAMUSCULAR | Status: AC
  Administered 2016-03-23: 10 mL
  Filled 2016-03-23: qty 20

## 2016-03-23 MED ORDER — ENOXAPARIN SODIUM 40 MG/0.4ML ~~LOC~~ SOLN
40.0000 mg | SUBCUTANEOUS | Status: DC
Start: 2016-03-23 — End: 2016-03-23

## 2016-03-23 MED ORDER — ACETAMINOPHEN 650 MG RE SUPP: 650.0000 mg | Freq: Four times a day (QID) | RECTAL | Status: DC | PRN

## 2016-03-23 MED ORDER — SUGAMMADEX SODIUM 200 MG/2ML IV SOLN
INTRAVENOUS | Status: DC | PRN
  Administered 2016-03-23: 200 mg via INTRAVENOUS

## 2016-03-23 MED ORDER — ESMOLOL HCL 100 MG/10ML IV SOLN
INTRAVENOUS | Status: DC | PRN
  Administered 2016-03-23: 30 mg via INTRAVENOUS
  Administered 2016-03-23: 40 mg via INTRAVENOUS
  Administered 2016-03-23: 30 mg via INTRAVENOUS

## 2016-03-23 MED ORDER — ASPIRIN 325 MG PO TABS
325.0000 mg | ORAL_TABLET | Freq: Every day | ORAL | Status: DC
  Administered 2016-03-24: 325 mg via ORAL
  Filled 2016-03-23: qty 1

## 2016-03-23 MED ORDER — NITROGLYCERIN 1 MG/10 ML FOR IR/CATH LAB
INTRA_ARTERIAL | Status: AC
  Filled 2016-03-23: qty 10

## 2016-03-23 MED ORDER — LIDOCAINE HCL (CARDIAC) 20 MG/ML IV SOLN
INTRAVENOUS | Status: DC | PRN
  Administered 2016-03-23: 80 mg via INTRAVENOUS

## 2016-03-23 MED ORDER — ONDANSETRON HCL 4 MG/2ML IJ SOLN: 4.0000 mg | Freq: Four times a day (QID) | INTRAMUSCULAR | Status: DC | PRN

## 2016-03-23 MED ORDER — PHENYLEPHRINE HCL 10 MG/ML IJ SOLN
INTRAMUSCULAR | Status: DC | PRN
  Administered 2016-03-23: 80 ug via INTRAVENOUS

## 2016-03-23 MED ORDER — HEPARIN (PORCINE) IN NACL 100-0.45 UNIT/ML-% IJ SOLN
700.0000 [IU]/h | INTRAMUSCULAR | Status: AC
  Administered 2016-03-23: 550 [IU]/h via INTRAVENOUS
  Filled 2016-03-23: qty 250

## 2016-03-23 MED ORDER — IOPAMIDOL (ISOVUE-300) INJECTION 61%
150.0000 mL | Freq: Once | INTRAVENOUS | Status: AC | PRN
  Administered 2016-03-23: 60 mL via INTRAVENOUS

## 2016-03-23 NOTE — Progress Notes (Signed)
ANTICOAGULATION CONSULT NOTE - Initial Consult  Pharmacy Consult for Heparin  Indication: post RT ICA stent assisted angioplasty  Allergies  Allergen Reactions  . Celecoxib Swelling  . Sulfa Antibiotics Other (See Comments)    unspecified    Patient Measurements: Height: 5\' 6"  (167.6 cm) Weight: 147 lb 12.8 oz (67.042 kg) IBW/kg (Calculated) : 63.8 Heparin Dosing Weight: = TBW 67 kg  Vital Signs: Temp: 98 F (36.7 C) (04/21 0900) Temp Source: Oral (04/21 0900) BP: 118/70 mmHg (04/21 0900) Pulse Rate: 77 (04/21 0900)  Labs:  Recent Labs  03/21/16 0242 03/21/16 1623 03/22/16 1207 03/22/16 1415 03/23/16 0030  HGB 12.5*  --   --   --  13.0  HCT 40.5  --   --   --  41.3  PLT 259  --   --   --  250  CREATININE 1.00  --  1.02  --  0.98  TROPONINI  --  0.03  --  0.03  --     Estimated Creatinine Clearance: 75 mL/min (by C-G formula based on Cr of 0.98).   Medical History: Past Medical History  Diagnosis Date  . 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 Hypertension   . High cholesterol   . Type II diabetes mellitus (HCC)   . Asthma   . GERD (gastroesophageal reflux disease)     "once in awhile"  . Rheumatoid arthritis (HCC)   . Psoriatic arthritis (HCC)   . Anxiety   . CAD, multiple vessel   . S/P CABG x 5     [facility] RIMA-LAD, SVG-OM, SVG-DIAG, [id number]   . TIA (transient ischemic attack) ?03/17/2016  . Claustrophobia   . Chronic kidney disease (CKD), stage II (mild)     03/19/2016 03/20/2016  . CHF (congestive heart failure) (HCC)     03/22/2016 03/20/2016    Medications:  Prescriptions prior to admission  Medication Sig Dispense Refill Last Dose  . aspirin 81 MG EC tablet Take 1 tablet (81 mg total) by mouth daily. 30 tablet 2 03/19/2016 at Unknown time  . atorvastatin (LIPITOR) 80 MG tablet Take 1 tablet (80 mg total) by mouth daily at 6 PM. 30 tablet 2 03/19/2016 at Unknown time  . budesonide-formoterol (SYMBICORT) 80-4.5 MCG/ACT inhaler Inhale 2 puffs  into the lungs 2 (two) times daily.   Past Week at Unknown time  . carvedilol (COREG) 6.25 MG tablet Take 1 tablet (6.25 mg total) by mouth 2 (two) times daily with a meal. 60 tablet 2 03/19/2016 at 2230  . furosemide (LASIX) 40 MG tablet Take 1 tablet (40 mg total) by mouth 2 (two) times daily. 60 tablet 2 03/19/2016 at Unknown time  . insulin aspart (NOVOLOG) 100 UNIT/ML injection Inject 5 Units into the skin 3 (three) times daily with meals. 10 mL 2 03/19/2016 at Unknown time  . insulin glargine (LANTUS) 100 UNIT/ML injection Inject 0.15 mLs (15 Units total) into the skin at bedtime. (Patient taking differently: Inject 10 Units into the skin at bedtime. ) 10 mL 11 03/19/2016 at Unknown time  . lisinopril (PRINIVIL,ZESTRIL) 2.5 MG tablet Take 1 tablet (2.5 mg total) by mouth daily. 30 tablet 2 03/19/2016 at Unknown time  . pantoprazole (PROTONIX) 40 MG tablet Take 1 tablet (40 mg total) by mouth daily. 30 tablet 2 03/19/2016 at Unknown time  . INSULIN SYRINGE .5CC/29G 29G X 1/2" 0.5 ML MISC Use one syringe per insulin injection. 100 each 11 Taking  . spironolactone (ALDACTONE) 25 MG tablet Take  0.5 tablets (12.5 mg total) by mouth daily. (Patient not taking: Reported on 03/20/2016) 180 tablet 3    Scheduled:  . [MAR Hold] aspirin EC  81 mg Oral Daily  . [MAR Hold] atorvastatin  80 mg Oral q1800  . ceFAZolin      . [MAR Hold] clopidogrel  75 mg Oral Daily  . enoxaparin (LOVENOX) injection  40 mg Subcutaneous Q24H  . [MAR Hold] furosemide  40 mg Oral BID  . [MAR Hold] insulin aspart  0-9 Units Subcutaneous TID WC  . [MAR Hold] insulin aspart  3 Units Subcutaneous TID WC  . [MAR Hold] insulin glargine  15 Units Subcutaneous QHS  . [MAR Hold] lisinopril  2.5 mg Oral Daily  . [MAR Hold] mometasone-formoterol  2 puff Inhalation BID  . nitroGLYCERIN      . [MAR Hold] pantoprazole  40 mg Oral Daily  . [MAR Hold] sodium chloride flush  3 mL Intravenous Q12H  . [MAR Hold] spironolactone  12.5 mg Oral Daily     Assessment: 60 y.o male with severe internal carotid artery stenosis. Now s/p RT ICA intracranial stent assisted angioplasty.   Goal of Therapy:  Heparin level = 0.1-0.25 Monitor platelets by anticoagulation protocol: Yes   Plan:  Start IV heparin drip at 550 units/hr (~8 units/kg/hr) Check heparin level in 6 hours Heparin to stop tomorrow at 0700  Noah Delaine, RPh Clinical Pharmacist Pager: [phone] 03/23/2016,4:18 PM

## 2016-03-23 NOTE — Progress Notes (Signed)
STROKE TEAM PROGRESS NOTE   HISTORY OF PRESENT ILLNESS Travis Tran is an 60 y.o. male patient who was brought into the emergency room for further evaluation of an episode which occurred 3 days ago on Saturday 03/17/2016 (time unknown) where he experienced speech symptoms suggestive of motor aphasia, had garbled speech which was incomprehensible and also had some right upper and lower extremity mild weakness and numbness symptoms. The symptoms lasted around 20-30 minutes and then resolved by themselves. He did not seek any medical attention at that time. He was going to see his cardiologist today, and was advised to present to the ER for further evaluation of his symptoms through his cardiology office. At this time, he denies any focal neurological symptoms. No speech or vision problems, no new sensory or motor symptoms in limbs no gait or balance problems or coordination problems. To his knowledge, never had prior strokes. He is only on aspirin 81 mg daily at home. Has severe ischemic cardiomyopathy with latest EF of 10%. Patient was not administered IV t-PA secondary to outside window, nonfocal exam. He was admitted for further evaluation and treatment.   SUBJECTIVE (INTERVAL HISTORY) . He has been stable overnight. He is scheduled for cerebral angioplasty stenting later this morning by Dr. Corliss Skains.He states is quite anxious about the procedure but feels good about having it.I spoke to Dr. Verdis Prime cardiologist who feels he is cleared for the procedure OBJECTIVE Temp:  [97.5 F (36.4 C)-98.5 F (36.9 C)] 98 F (36.7 C) (04/21 0900) Pulse Rate:  [77-82] 77 (04/21 0900) Cardiac Rhythm:  [-] Normal sinus rhythm (04/21 0844) Resp:  [16-18] 18 (04/21 0900) BP: (107-123)/(65-77) 118/70 mmHg (04/21 0900) SpO2:  [97 %-99 %] 98 % (04/21 0524) Weight:  [147 lb 12.8 oz (67.042 kg)] 147 lb 12.8 oz (67.042 kg) (04/21 0500)  CBC:   Recent Labs Lab 03/20/16 1052  03/21/16 0242 03/23/16 0030  WBC  10.7*  --  8.0 9.3  NEUTROABS 7.2  --   --   --   HGB 13.2  < > 12.5* 13.0  HCT 42.9  < > 40.5 41.3  MCV 83.0  --  82.8 81.9  PLT 314  --  259 250  < > = values in this interval not displayed.  Basic Metabolic Panel:   Recent Labs Lab 03/22/16 1207 03/23/16 0030  NA 137 136  K 4.3 3.8  CL 102 101  CO2 25 26  GLUCOSE 246* 170*  BUN 18 19  CREATININE 1.02 0.98  CALCIUM 8.7* 9.0    Lipid Panel:     Component Value Date/Time   CHOL 191 02/28/2016 0524   TRIG 110 02/28/2016 0524   HDL 49 02/28/2016 0524   CHOLHDL 3.9 02/28/2016 0524   VLDL 22 02/28/2016 0524   LDLCALC 120* 02/28/2016 0524   HgbA1c:  Lab Results  Component Value Date   HGBA1C 15.1* 02/27/2016   Urine Drug Screen: No results found for: LABOPIA, COCAINSCRNUR, LABBENZ, AMPHETMU, THCU, LABBARB    IMAGING  Ct Head Wo Contrast 03/20/2016   No acute intracranial abnormalities. Mild RIGHT maxillary sinus disease.   Ct Angio Head & Neck W/cm &/or Wo/cm 03/20/2016   1. 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 2. 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 Reconstituted flow at the left ICA terminus such that the left MCA and ACA origins  remain normal. 3. Very advanced for age atherosclerosis in the head and neck. Additional atherosclerotic moderate or severe stenosis: - Right ICA origin (65%), - Right ICA siphon, - Left Vertebral artery V4 segment, - Right PCA P2 segment (moderate) - Left ACA callosomarginal artery origin. 4.  No cortically based acute infarct identified.   Mr Brain Wo Contrast 03/20/2016   No acute infarct or intracranial hemorrhage. Abnormal appearance of the left internal carotid artery. Please see CT angiogram report. Mild chronic microvascular changes most notable left parietal lobe. Right parietal region small arachnoid cyst may be present as noted  above. Moderate mucosal thickening right maxillary sinus.    PHYSICAL EXAM Middle-age Caucasian male currently not in distress. . Afebrile. Head is nontraumatic. Neck is supple without bruit.    Cardiac exam no murmur or gallop. Lungs are clear to auscultation. Distal pulses are well felt. Neurological Exam ;  Awake  Alert oriented x 3. Normal speech and language.eye movements full without nystagmus.fundi were not visualized. Vision acuity and fields appear normal. Hearing is normal. Palatal movements are normal. Face symmetric. Tongue midline. Normal strength, tone, reflexes and coordination. Normal sensation. Gait deferred. ASSESSMENT/PLAN Travis Tran is a 60 y.o. male with history of situs inversus, DM, HTN, CAD, high cholesterol, s/p CABG x5, and a recent diagnosis of CHF w/known EF 10% presenting with motor aphasia with garbled speech and R sided weakness and numbness 3 days ago. He did not receive IV t-PA due to delay in arrrival.   Left brain TIA in setting of left ICA severe stenosis  MRI  No acute infarct  CTA head and neck  Situs inversus. L ICA high grade stenosis ? Trickle flow reconstituted and terminus. Advanced for age atherosclerosis - right ICA origin 65%  Cerebal angio planned for today - consider stent at time of angio if needed. Dr. Pearlean Brownie to discuss with Dr. Loni Beckwith timing. Patient wants all workup done during this admission - not a surgical candidate secondary to low EF  Carotid Doppler see catheter angio 2D Echo  Left ventricle: The cavity size was normal. Systolic function was  normal. The estimated ejection fraction was 15%. Severe diffuse  hypokinesis with regional variations. There is akinesis of the  entireapical myocardium. There is akinesis of the   mid-apicallateral myocardium.  LDL 120 on 02/28/2016  HgbA1c 15.1 on 02/27/2016   Lovenox 40 mg sq daily for VTE prophylaxis Diet NPO time specified Except for: Sips with Meds  aspirin 81 mg daily  prior to admission, now on aspirin 81 mg daily and clopidogrel 75 mg daily  Ongoing aggressive stroke risk factor management  Therapy recommendations:  pending   Disposition:  pending   Essential Hypertension  Stable  Permissive hypertension (OK if < 220/120) but gradually normalize in 5-7 days  Hyperlipidemia  Home meds:  lipitor 80, resumed in hospital  LDL 120 on 02/28/2016, goal < 70  Continue statin at discharge  Diabetes type II  HgbA1c 5.1 on 02/27/2016, goal < 7.0  Uncontrolled  Other Stroke Risk Factors  Marijuana use  Coronary artery disease s/p CABG  Other Active Problems  Situs inversus with dextrocardia  Asthma  GERD  RA  Psoriatic arthritis  Anxiety     Hospital day #    .Continue aspirin and Plavix   endovascular revascularization today by Dr. Osa Craver, MD Medical Director [facility] Pager: [phone] 03/23/2016 3:01 PM   To contact Stroke Continuity provider, please refer to [email or web address]. After  hours, contact General Neurology

## 2016-03-23 NOTE — Progress Notes (Addendum)
   Awaiting Carotid stent.  We will follow clinical status as needed over weekend.  No change in clinical status or my recommendations since yesterday.

## 2016-03-23 NOTE — Progress Notes (Signed)
Patient ID: Travis Tran, male   DOB: Jan 09, 1956, 60 y.o.   MRN: [id number] INR . Post procedure . Patient extubated without difficulty. Denies any H/As,N/V ,visual aberrations or motor weakness or speech difficulties. Denies any chest pain or SOB. O/e . VSS /PaO2 .95% on 2L [place].. Oriented x3 Pupils 87mm equally reactive. EOMS full No nystagmus. No facial asymmetry. Tongue in midline. Motor. No drift of outstretched hands  Hand grip ,and plantar flexion %/% rt = lt. Plan . 1.Continue on IV heparin. 2.Close neuro obs. 3.Close BP monitoring. D/W patient and friend. D/W Stroke Neurology Dr Pearlean Brownie. Fatima Sanger MD

## 2016-03-23 NOTE — Progress Notes (Signed)
Subjective: Patient was seen and examined at bedside. Denies having any dizziness/ lightheadedness or headaches. Denies having any focal weakness or numbness. Denies having any CP, SOB, or palpitations. No other complaints.    Objective: Vital signs in last 24 hours: Filed Vitals:   03/23/16 0500 03/23/16 0524 03/23/16 0900 03/23/16 1600  BP:  107/74 118/70 115/72  Pulse:  82 77 75  Temp:  97.7 F (36.5 C) 98 F (36.7 C) 98.5 F (36.9 C)  TempSrc:  Oral Oral   Resp:  18 18 18   Height:      Weight: 67.042 kg (147 lb 12.8 oz)     SpO2:  98%  98%   Weight change: -3.175 kg (-7 lb)  Intake/Output Summary (Last 24 hours) at 03/23/16 1659 Last data filed at 03/23/16 1521  Gross per 24 hour  Intake   1100 ml  Output   1050 ml  Net     50 ml   Physical Exam: Constitutional: He is oriented to person, place, and time and well-developed, well-nourished, and in no distress.   Eyes: EOM are normal. Cardiovascular: Normal rate, regular rhythm, normal heart sounds and intact distal pulses.Heart sound auscultated on the right side of the chest.  Pulmonary/Chest: Effort normal. No stridor. Lungs CTAB. No respiratory distress. He has no wheezes.  Abdominal: Soft. He exhibits no distension. There is no tenderness. There is no rebound and no guarding.  Musculoskeletal: He exhibits no edema.  Neurological: He is alert and oriented to person, place, and time.  CN II-XII intact to bedside testing. Strength 5/5 in bilateral upper and lower extremities. Sensation grossly intact in bilateral upper and lower extremities.  Skin: Skin is warm and dry. He is not diaphoretic.   Lab Results: Basic Metabolic Panel:  Recent Labs Lab 03/22/16 1207 03/23/16 0030  NA 137 136  K 4.3 3.8  CL 102 101  CO2 25 26  GLUCOSE 246* 170*  BUN 18 19  CREATININE 1.02 0.98  CALCIUM 8.7* 9.0   Liver Function Tests:  Recent Labs Lab 03/20/16 1052  AST 20  ALT 17  ALKPHOS 81  BILITOT 0.5  PROT 6.7    ALBUMIN 2.7*   CBC:  Recent Labs Lab 03/20/16 1052  03/21/16 0242 03/23/16 0030  WBC 10.7*  --  8.0 9.3  NEUTROABS 7.2  --   --   --   HGB 13.2  < > 12.5* 13.0  HCT 42.9  < > 40.5 41.3  MCV 83.0  --  82.8 81.9  PLT 314  --  259 250  < > = values in this interval not displayed. CBG:  Recent Labs Lab 03/22/16 1128 03/22/16 1646 03/22/16 2159 03/23/16 0645 03/23/16 1114 03/23/16 1605  GLUCAP 205* 232* 197* 123* 120* 157*   Coagulation:  Recent Labs Lab 03/20/16 1052  LABPROT 14.2  INR 1.08   Micro Results: Recent Results (from the past 240 hour(s))  Surgical pcr screen     Status: Abnormal   Collection Time: 03/22/16 10:25 PM  Result Value Ref Range Status   MRSA, PCR POSITIVE (A) NEGATIVE Final    Comment: RESULT CALLED TO, READ BACK BY AND VERIFIED WITH: A SWARZ,RN @0211  03/23/16 MKELLY    Staphylococcus aureus POSITIVE (A) NEGATIVE Final    Comment:        The Xpert SA Assay (FDA approved for NASAL specimens in patients over 99 years of age), is one component of a comprehensive surveillance program.  Test performance has  been validated by [facility] for patients greater than or equal to 27 year old. It is not intended to diagnose infection nor to guide or monitor treatment.    Studies/Results: No results found. Medications: I have reviewed the patient's current medications. Scheduled Meds: . [MAR Hold] aspirin EC  81 mg Oral Daily  . [MAR Hold] atorvastatin  80 mg Oral q1800  . ceFAZolin      . [MAR Hold] clopidogrel  75 mg Oral Daily  . [MAR Hold] furosemide  40 mg Oral BID  . [MAR Hold] insulin aspart  0-9 Units Subcutaneous TID WC  . [MAR Hold] insulin aspart  3 Units Subcutaneous TID WC  . [MAR Hold] insulin glargine  15 Units Subcutaneous QHS  . [MAR Hold] lisinopril  2.5 mg Oral Daily  . [MAR Hold] mometasone-formoterol  2 puff Inhalation BID  . nitroGLYCERIN      . [MAR Hold] pantoprazole  40 mg Oral Daily  . [MAR Hold] sodium  chloride flush  3 mL Intravenous Q12H  . [MAR Hold] spironolactone  12.5 mg Oral Daily   Continuous Infusions: . sodium chloride    . heparin 550 Units/hr (03/23/16 1640)  . lactated ringers 50 mL/hr (03/23/16 1159)  . norepinephrine (LEVOPHED) Adult infusion 1 mcg/min (03/23/16 1640)   PRN Meds:. 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 P2Y12 level 113 consistent with appropriate receptor blockade.  - Tele - Plavix 75 mg daily - Aspirin 81 mg daily  - Atorvastatin 80 mg daily - PT/OT  Combined CHF/CAD: EF 10% and grade 3 diastolic dysfunction in March 5427.Euvolemic at present. Will continue his current heart failure medications. - Plavix as above  - Atorvastatin 80 mg daily  - Carvedilol 6.25 mg BID - Lisinopril 2.5 mg daily  - Spironolactone 12.5 mg daily  - Lasix 40 mg BID  DMII, uncontrolled: A1c 15.1% during last admission. He was started on Lantus 15 U qHS and Novolog 3 U qAC TID. - Lantus 15 U qHS - Novolog 3u TID - SSI- sensitive   CKD2: Cr at baseline. 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 He denies family history of CF. His father had lung cancer. He has never had genetic testing or sperm studies. - CTM  FEN/GI: - HH  DVT Ppx: Lovenox  Dispo: Disposition is deferred at this time, awaiting improvement of current medical problems.  Anticipated discharge in approximately 1-2 day(s).   The patient does have a current PCP (Dawn Marland Mcalpine, MD) and does need an [facility] hospital follow-up appointment after discharge.  The patient does not have transportation limitations that hinder transportation to clinic appointments.  .Services Needed at time of discharge: Y = Yes, Blank = No PT:   OT:   RN:   Equipment:   Other:     LOS: 1 day   Travis Giovanni, MD 03/23/2016, 4:59 PM

## 2016-03-23 NOTE — Care Management Note (Signed)
Case Management Note  Patient Details  Name: Travis Tran MRN: [id number] Date of Birth: [birth year]  Subjective/Objective:                    Action/Plan: Pt with continued medical workup. CM following for discharge needs.   Expected Discharge Date:                  Expected Discharge Plan:     In-House Referral:     Discharge planning Services     Post Acute Care Choice:    Choice offered to:     DME Arranged:    DME Agency:     HH Arranged:    HH Agency:     Status of Service:  In process, will continue to follow  Medicare Important Message Given:    Date Medicare IM Given:    Medicare IM give by:    Date Additional Medicare IM Given:    Additional Medicare Important Message give by:     If discussed at Long Length of Stay Meetings, dates discussed:    Additional Comments:  Kermit Balo, RN 03/23/2016, 11:01 AM

## 2016-03-23 NOTE — Procedures (Signed)
S/P RT ICA intracranial stent assisted angioplasty for severe stenosis.

## 2016-03-23 NOTE — Progress Notes (Signed)
MD aware of positive MRSA, contact precautions initiated.

## 2016-03-23 NOTE — Progress Notes (Signed)
Subjective: No events overnight. Has had headache off and on. No N/V, constipation, abdominal pain, changes in vision. Hs is nervous about his stent placement this afternoon.   Objective: Vital signs in last 24 hours: Filed Vitals:   03/22/16 2157 03/23/16 0056 03/23/16 0500 03/23/16 0524  BP: 117/77 108/65  107/74  Pulse: 80 77  82  Temp: 97.5 F (36.4 C) 98.1 F (36.7 C)  97.7 F (36.5 C)  TempSrc: Oral Oral  Oral  Resp: 16 16  18   Height:      Weight:   67.042 kg (147 lb 12.8 oz)   SpO2: 99% 99%  98%   Weight change: -3.175 kg (-7 lb) No intake or output data in the 24 hours ending 03/23/16 0929  Physical Exam: GEN: NAD, well, appearing CV: RRR, no murmurs PULM: NWOB, CTAB GI: non-tender, non-distended, normoactive bowel sounds NEURO: strength 5/5 in bilateral extremities, sensation intact in bilateral extremities, 1+ patellar reflexes, CN intact, normal cerebellar exam SKIN: not diaphoretic, warm  Lab Results: BMP Latest Ref Rng 03/23/2016 03/22/2016 03/21/2016  Glucose 65 - 99 mg/dL 03/23/2016) 333(O) 329(V)  BUN 6 - 20 mg/dL 19 18 916(O)  Creatinine 0.61 - 1.24 mg/dL 06(Y 0.45 9.97  Sodium 135 - 145 mmol/L 136 137 139  Potassium 3.5 - 5.1 mmol/L 3.8 4.3 4.0  Chloride 101 - 111 mmol/L 101 102 103  CO2 22 - 32 mmol/L 26 25 27   Calcium 8.9 - 10.3 mg/dL 9.0 7.41) 9.1   CBC Latest Ref Rng 03/23/2016 03/21/2016 03/20/2016  WBC 4.0 - 10.5 K/uL 9.3 8.0 -  Hemoglobin 13.0 - 17.0 g/dL 03/23/2016 12.5(L) 15.6  Hematocrit 39.0 - 52.0 % 41.3 40.5 46.0  Platelets 150 - 400 K/uL 250 259 -   Platelet inhibition p2y12 assay: 113 (L)---ref range 194-418 PRU Capillary glucose: 123  Micro Results: Recent Results (from the past 240 hour(s))  Surgical pcr screen     Status: Abnormal   Collection Time: 03/22/16 10:25 PM  Result Value Ref Range Status   MRSA, PCR POSITIVE (A) NEGATIVE Final    Comment: RESULT CALLED TO, READ BACK BY AND VERIFIED WITH: A SWARZ,RN @0211  03/23/16 MKELLY    Staphylococcus aureus POSITIVE (A) NEGATIVE Final    Comment:        The Xpert SA Assay (FDA approved for NASAL specimens in patients over 12 years of age), is one component of a comprehensive surveillance program.  Test performance has been validated by [facility] for patients greater than or equal to 83 year old. It is not intended to diagnose infection nor to guide or monitor treatment.    Studies/Results: No results found. Medications: Scheduled Meds: . aspirin EC  81 mg Oral Daily  . atorvastatin  80 mg Oral q1800  . clopidogrel  75 mg Oral Daily  . enoxaparin (LOVENOX) injection  40 mg Subcutaneous Q24H  . furosemide  40 mg Oral BID  . insulin aspart  0-9 Units Subcutaneous TID WC  . insulin aspart  3 Units Subcutaneous TID WC  . insulin glargine  15 Units Subcutaneous QHS  . lisinopril  2.5 mg Oral Daily  . mometasone-formoterol  2 puff Inhalation BID  . pantoprazole  40 mg Oral Daily  . sodium chloride flush  3 mL Intravenous Q12H  . spironolactone  12.5 mg Oral Daily   Continuous Infusions:  PRN Meds:. 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 No remaining neuro deficits on admission (4/18), so unlikely a stroke. HPI not indicative of seizure and no history of seizure.Unlikely tumor or fungal mass because CT showed no mass effect or midline shift. ED EKG showed no a fib, but could be transient. MRI brain showed no infarct or intracranial hemorrhage. MRI was positive for left parietal chronic microvascular changes. Right parietal lobe was  normal. CT angio of neck showed advanced atherosclerosis for age and 65% occlusion of left internal carotid artery. Left MCA and left ACA origins are intact. PT/OT consults on 4/19 showed no deficits. Bubble study on 4/19 showed LVEF 15% with diffuse hypokinesis, RV normal size, no PFO ro R--> L shhunt. EKG on 4/19 showed prolonged QT (QT 454/QTc 500) and sinus rhythm with PVCs and PACs. CT arteriogram on 4/19 showed 80-85% right internal carotid occlusion, 75-80% left VBJ occlusion distal to left PICA and at opthalmic artery. IR is planing on doing right carotid angioplasty with stent placement on 4/21 because right side is more salvagable. They are discussing with anesthesia how best to anesthetize him for tomorrow's angioplasty due to HF complications. P2Y12 levels are below reference range, indicating Clopidogrel is working. - Right carotid stent placement - Te[place] - Plavix 75 mg daily - ASA 81 mg daily - Atorvastatin 80 mg daily - Send home with Loop or Holter monitor to r/u a fib  Intermittent Bradycardia s/p cerebral angiogram on 4/19: EKG and troponin checked on 4/19. Troponin was 0.03. EKG showed no ischemic changes, but showed changes as above. Coreg was stopped due to bradycardia. Coreg restarted on 4/20. Will be held on the morning of 4/21 because of surgery and restarted that evening. - Teley  CHF: Recently diagnosed in March 2017- EF 10% and grade 3 diastolic dysfunction. Says he has been compliant with medicines and follow-up. Continue home meds: - Coreg 6.25 mg BID, hold on morning of 4/21 for surgery - Lisinopril 2.5 mg daily - Spironolactone 12.5 mg daily - Lasix 40 mg BID - F/u echo @ 2 months post discharge   Type II DM: It is poorly controlled. Last A1c was 15.1%. He was started on Lantus 15 U qHS and Novolog 3 U qAC TID, last month, so wait to see if new regimen helps. He is also on an irregular eating and insulin regimen in the hospital, so that may be contributing. F/u with  PCP to see if more acute management changes are needed.  - SSI - Lantus 15 U qHS - Novolog 3U TID  CKD2: Cr near baseline. Unclear if AKI.GFR > 60. Cr 1.00 on morning of 4/19 and 1.02 on 4/20. 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 He denies family history of CF. His father had lung cancer. He has never had genetic testing or sperm studies. - CTM  FEN/GI: - HH  DVT Ppx: Lovenox  Dispo: Disposition is deferred at this time, awaiting improvement of current medical problems. Anticipated discharge in approximately 1-2 day(s).   The patient does have a current PCP (Dawn Marland Mcalpine, MD) and does need an [facility] hospital follow-up appointment after discharge.  The patient does  not have transportation limitations that hinder transportation to clinic appointments. This is a Psychologist, occupational Note.  The care of the patient was discussed with Dr. Loney Loh and the assessment and plan formulated with their assistance.  Please see their attached note for official documentation of the daily encounter.   LOS: 1 day   Elton Sin, Med Student 03/23/2016, 9:29 AM

## 2016-03-23 NOTE — Anesthesia Procedure Notes (Signed)
Procedure Name: Intubation Date/Time: 03/23/2016 1:00 PM Performed by: Little Ishikawa L Pre-anesthesia Checklist: Patient identified, Emergency Drugs available, Suction available, Patient being monitored and Timeout performed Patient Re-evaluated:Patient Re-evaluated prior to inductionOxygen Delivery Method: Circle system utilized Preoxygenation: Pre-oxygenation with 100% oxygen Intubation Type: IV induction Ventilation: Mask ventilation without difficulty Laryngoscope Size: Mac and 4 Grade View: Grade I Tube type: Oral Tube size: 7.5 mm Number of attempts: 1 Airway Equipment and Method: Stylet Placement Confirmation: ETT inserted through vocal cords under direct vision,  positive ETCO2 and breath sounds checked- equal and bilateral Secured at: 22 cm Tube secured with: Tape Dental Injury: Teeth and Oropharynx as per pre-operative assessment

## 2016-03-23 NOTE — Anesthesia Preprocedure Evaluation (Addendum)
Anesthesia Evaluation    Airway        Dental   Pulmonary           Cardiovascular hypertension, + CAD, + CABG and +CHF    Situs inversus with dextrocardia   Neuro/Psych TIA   GI/Hepatic   Endo/Other  diabetes  Renal/GU      Musculoskeletal   Abdominal   Peds  Hematology   Anesthesia Other Findings   Reproductive/Obstetrics                            Anesthesia Physical Anesthesia Plan  ASA: IV  Anesthesia Plan: General   Post-op Pain Management:    Induction: Intravenous  Airway Management Planned: Oral ETT  Additional Equipment: Arterial line  Intra-op Plan:   Post-operative Plan: Extubation in OR  Informed Consent: I have reviewed the patients History and Physical, chart, labs and discussed the procedure including the risks, benefits and alternatives for the proposed anesthesia with the patient or authorized representative who has indicated his/her understanding and acceptance.   Dental advisory given  Plan Discussed with: CRNA  Anesthesia Plan Comments:         Anesthesia Quick Evaluation

## 2016-03-23 NOTE — Transfer of Care (Signed)
Immediate Anesthesia Transfer of Care Note  Patient: Travis Tran  Procedure(s) Performed: Procedure(s): STENT PLACEMENT (N/A)  Patient Location: PACU  Anesthesia Type:General  Level of Consciousness: awake, alert , oriented and patient cooperative  Airway & Oxygen Therapy: Patient Spontanous Breathing and Patient connected to nasal cannula oxygen  Post-op Assessment: Report given to RN and Post -op Vital signs reviewed and stable  Post vital signs: Reviewed and stable  Last Vitals:  Filed Vitals:   03/23/16 0524 03/23/16 0900  BP: 107/74 118/70  Pulse: 82 77  Temp: 36.5 C 36.7 C  Resp: 18 18    Complications: No apparent anesthesia complications

## 2016-03-24 DIAGNOSIS — I25119 Atherosclerotic heart disease of native coronary artery with unspecified angina pectoris: Secondary | ICD-10-CM

## 2016-03-24 DIAGNOSIS — I5022 Chronic systolic (congestive) heart failure: Secondary | ICD-10-CM

## 2016-03-24 DIAGNOSIS — I429 Cardiomyopathy, unspecified: Secondary | ICD-10-CM

## 2016-03-24 DIAGNOSIS — Q893 Situs inversus: Secondary | ICD-10-CM

## 2016-03-24 DIAGNOSIS — E785 Hyperlipidemia, unspecified: Secondary | ICD-10-CM

## 2016-03-24 LAB — CBC WITH DIFFERENTIAL/PLATELET
BASOS ABS: 0 10*3/uL (ref 0.0–0.1)
Basophils Relative: 0 %
EOS ABS: 0.2 10*3/uL (ref 0.0–0.7)
Eosinophils Relative: 2 %
HCT: 42.3 % (ref 39.0–52.0)
HEMOGLOBIN: 13.6 g/dL (ref 13.0–17.0)
LYMPHS PCT: 24 %
Lymphs Abs: 2.6 10*3/uL (ref 0.7–4.0)
MCH: 26.5 pg (ref 26.0–34.0)
MCHC: 32.2 g/dL (ref 30.0–36.0)
MCV: 82.5 fL (ref 78.0–100.0)
Monocytes Absolute: 1 10*3/uL (ref 0.1–1.0)
Monocytes Relative: 9 %
NEUTROS PCT: 65 %
Neutro Abs: 7.2 10*3/uL (ref 1.7–7.7)
Platelets: 261 10*3/uL (ref 150–400)
RBC: 5.13 MIL/uL (ref 4.22–5.81)
RDW: 15.5 % (ref 11.5–15.5)
WBC: 11 10*3/uL — ABNORMAL HIGH (ref 4.0–10.5)

## 2016-03-24 LAB — BASIC METABOLIC PANEL
Anion gap: 10 (ref 5–15)
BUN: 16 mg/dL (ref 6–20)
CHLORIDE: 102 mmol/L (ref 101–111)
CO2: 26 mmol/L (ref 22–32)
Calcium: 8.7 mg/dL — ABNORMAL LOW (ref 8.9–10.3)
Creatinine, Ser: 0.88 mg/dL (ref 0.61–1.24)
Glucose, Bld: 90 mg/dL (ref 65–99)
POTASSIUM: 4.1 mmol/L (ref 3.5–5.1)
SODIUM: 138 mmol/L (ref 135–145)

## 2016-03-24 LAB — GLUCOSE, CAPILLARY
GLUCOSE-CAPILLARY: 110 mg/dL — AB (ref 65–99)
Glucose-Capillary: 71 mg/dL (ref 65–99)

## 2016-03-24 LAB — HEPARIN LEVEL (UNFRACTIONATED): Heparin Unfractionated: <0.1 IU/mL — ABNORMAL LOW (ref 0.30–0.70)

## 2016-03-24 LAB — PLATELET INHIBITION P2Y12: PLATELET FUNCTION P2Y12: 85 [PRU] — AB (ref 194–418)

## 2016-03-24 MED ORDER — CLOPIDOGREL BISULFATE 75 MG PO TABS: 75.0000 mg | ORAL_TABLET | Freq: Every day | ORAL | Status: DC

## 2016-03-24 MED ORDER — ASPIRIN 325 MG PO TABS
325.0000 mg | ORAL_TABLET | Freq: Every day | ORAL | Status: DC
Start: 2016-03-24 — End: 2016-06-06

## 2016-03-24 MED ORDER — INSULIN GLARGINE 100 UNIT/ML ~~LOC~~ SOLN: 15.0000 [IU] | Freq: Every day | SUBCUTANEOUS | Status: DC

## 2016-03-24 NOTE — Progress Notes (Signed)
Subjective: Stent procedure went well yesterday. He reports having a mild headache last night but this has now resolved. He denies any changes in vision or speech, weakness, or numbness/tinging.    Objective: Vital signs in last 24 hours: Filed Vitals:   03/24/16 0400 03/24/16 0500 03/24/16 0600 03/24/16 0700  BP: 98/67 117/71 113/75 113/75  Pulse: 73 72 71 71  Temp:      TempSrc:      Resp: 23 16 11 10   Height:      Weight: 153 lb (69.4 kg)     SpO2: 98% 95% 98% 97%   Weight change: 5 lb 3.2 oz (2.358 kg)  Intake/Output Summary (Last 24 hours) at 03/24/16 0829 Last data filed at 03/24/16 0700  Gross per 24 hour  Intake 2117.74 ml  Output   2860 ml  Net -742.26 ml   Physical Exam: General: alert, sitting up in bed, NAD HEENT: /AT, PERRL, EOMI, sclera anicteric, mucus membranes moist Cardiovascular: RRR, no m/g/r Pulm: CTA bilaterally, breaths non-labored  Abd: BS+, soft, non-tender  Msk: no peripheral edema Neuro: He is alert and oriented to person, place, and time.  CN II-XII intact to bedside testing. Strength 5/5 in bilateral upper and lower extremities. Sensation grossly intact in bilateral upper and lower extremities.  Skin: Skin is warm and dry. He is not diaphoretic.   Lab Results: Basic Metabolic Panel:  Recent Labs Lab 03/23/16 0030 03/24/16 0400  NA 136 138  K 3.8 4.1  CL 101 102  CO2 26 26  GLUCOSE 170* 90  BUN 19 16  CREATININE 0.98 0.88  CALCIUM 9.0 8.7*   Liver Function Tests:  Recent Labs Lab 03/20/16 1052  AST 20  ALT 17  ALKPHOS 81  BILITOT 0.5  PROT 6.7  ALBUMIN 2.7*   CBC:  Recent Labs Lab 03/20/16 1052  03/23/16 0030 03/24/16 0400  WBC 10.7*  < > 9.3 11.0*  NEUTROABS 7.2  --   --  PENDING  HGB 13.2  < > 13.0 13.6  HCT 42.9  < > 41.3 42.3  MCV 83.0  < > 81.9 82.5  PLT 314  < > 250 261  < > = values in this interval not displayed. CBG:  Recent Labs Lab 03/22/16 1646 03/22/16 2159 03/23/16 0645 03/23/16 1114  03/23/16 1605 03/23/16 2208  GLUCAP 232* 197* 123* 120* 157* 95   Coagulation:  Recent Labs Lab 03/20/16 1052  LABPROT 14.2  INR 1.08   Micro Results: Recent Results (from the past 240 hour(s))  Surgical pcr screen     Status: Abnormal   Collection Time: 03/22/16 10:25 PM  Result Value Ref Range Status   MRSA, PCR POSITIVE (A) NEGATIVE Final    Comment: RESULT CALLED TO, READ BACK BY AND VERIFIED WITH: A SWARZ,RN @0211  03/23/16 MKELLY    Staphylococcus aureus POSITIVE (A) NEGATIVE Final    Comment:        The Xpert SA Assay (FDA approved for NASAL specimens in patients over 75 years of age), is one component of a comprehensive surveillance program.  Test performance has been validated by [facility] for patients greater than or equal to 79 year old. It is not intended to diagnose infection nor to guide or monitor treatment.    Studies/Results: No results found. Medications: I have reviewed the patient's current medications. Scheduled Meds: . aspirin  325 mg Oral Q breakfast  . atorvastatin  80 mg Oral q1800  . clopidogrel  75 mg Oral  Q breakfast  . furosemide  40 mg Oral BID  . insulin aspart  0-9 Units Subcutaneous TID WC  . insulin aspart  3 Units Subcutaneous TID WC  . insulin glargine  15 Units Subcutaneous QHS  . lisinopril  2.5 mg Oral Daily  . mometasone-formoterol  2 puff Inhalation BID  . pantoprazole  40 mg Oral Daily  . sodium chloride flush  3 mL Intravenous Q12H  . spironolactone  12.5 mg Oral Daily   Continuous Infusions: . sodium chloride 75 mL/hr at 03/23/16 1900  . lactated ringers 50 mL/hr (03/23/16 1159)  . niCARDipine     PRN Meds:. Assessment/Plan:  TIA: Interventional radiology performed a right carotid angioplasty with stent placement yesterday (4/21). Procedure went well with no complications. Patient is doing great this morning. No new neuro symptoms. P2Y12 level 113 consistent with appropriate receptor blockade. If ok with  neurology, he is stable to be discharged home today. 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 Will continue his current heart failure medications. - Plavix as above  - Atorvastatin 80 mg daily  - Carvedilol 6.25 mg BID - Lisinopril 2.5 mg daily  - Spironolactone 12.5 mg daily  - Lasix 40 mg BID  DMII, uncontrolled: A1c 15.1% during last admission. He was started on Lantus 15 U qHS and Novolog 3 U qAC TID. - Lantus 15 U qHS - Novolog 3u TID - SSI- sensitive   CKD2: Cr at baseline. 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 He denies family history of CF. His father had lung cancer. He has never had genetic testing or sperm studies. - CTM  Diet: Heart healthy DVT Ppx: Lovenox Dispo: Discharge likely today or tomorrow  The patient does have a current PCP (Dawn Marland Mcalpine, MD) and does need an [facility] hospital follow-up appointment after discharge.  The patient does not have transportation limitations that hinder transportation to clinic appointments.  .Services Needed at time of discharge: Y = Yes, Blank = No PT:   OT:   RN:   Equipment:   Other:     LOS: 2 days   Su Hoff, MD 03/24/2016, 8:29 AM

## 2016-03-24 NOTE — Discharge Summary (Signed)
Name: Travis Tran MRN: [id number] DOB: 1956-10-08 60 y.o. 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     clopidogrel 75 MG tablet  Commonly known as:  PLAVIX  Take 1 tablet (75 mg total) by mouth daily with breakfast.      ASK your doctor about these medications        atorvastatin 80 MG tablet  Commonly known as:  LIPITOR  Take 1 tablet (80 mg total) by mouth daily at 6 PM.     budesonide-formoterol 80-4.5 MCG/ACT inhaler  Commonly known as:  SYMBICORT  Inhale 2 puffs into the lungs 2 (two) times daily.     carvedilol 6.25 MG tablet  Commonly known as:  COREG  Take 1 tablet (6.25 mg total) by mouth 2 (two) times daily with a meal.     furosemide 40 MG tablet  Commonly known as:  LASIX  Take 1 tablet (40 mg total) by mouth 2 (two) times daily.     insulin aspart 100 UNIT/ML injection  Commonly known as:  novoLOG  Inject 5 Units into the skin 3 (three) times daily with meals.     insulin glargine 100 UNIT/ML injection  Commonly known as:  LANTUS  Inject 0.15 mLs (15 Units total) into the skin at bedtime.     INSULIN SYRINGE .5CC/29G 29G X 1/2" 0.5 ML Misc  Use one syringe per insulin injection.     lisinopril 2.5 MG tablet  Commonly known as:  PRINIVIL,ZESTRIL  Take 1 tablet (2.5 mg total) by mouth daily.     pantoprazole 40 MG tablet  Commonly known as:  PROTONIX  Take 1 tablet (40 mg total) by mouth daily.     spironolactone 25 MG tablet  Commonly known as:  ALDACTONE    Take 0.5 tablets (12.5 mg total) by mouth daily.        Disposition and follow-up:   Travis Tran was discharged from [facility] in Good condition.  At the hospital follow up visit please address:  1.  TIA: Discharged on ASA 325 and Plavix per Neuro. Ensure patient taking both. Will need repeat P2Y12 in 2 weeks.  CHF: Check BP. Aim for BPs in 120s or higher with significant carotid stenosis. Adjust medications as necessary.  Type 2 DM: Discharged on Lantus 15 units QHS and Novolog 5 units TID with meals. Please assess blood sugars and adjust insulin as necessary.  2.  Labs / imaging needed at time of follow-up: P2Y12- in 2 weeks (week of May 8th)  3.  Pending labs/ test needing follow-up: None   Follow-up Appointments:     Follow-up Information    Follow up with DEVESHWAR, Grandville Silos, MD In 2 weeks.   Specialty:  Interventional Radiology   Why:  [name] will call you   Contact information:   [address] [place] [address] [phone]  Discharge Instructions:   Consultations: Treatment Team:  Rounding Lbcardiology, MD  Procedures Performed:  Ct Angio Head W/cm &/or Wo Cm  03/20/2016  CLINICAL DATA:  60 year old male with stroke like symptoms several days ago including right side weakness and slurred speech. Initial encounter. EXAM: CT ANGIOGRAPHY HEAD AND NECK TECHNIQUE: Multidetector CT imaging of the head and neck was performed using the standard protocol during bolus administration of intravenous contrast. Multiplanar CT image reconstructions and MIPs were obtained to evaluate the vascular anatomy. Carotid stenosis measurements (when applicable) are obtained utilizing NASCET criteria, using the distal internal carotid diameter as the denominator. CONTRAST:  50 mL Isovue 370 COMPARISON:  Head CT without contrast 1133 hours today. FINDINGS: CTA NECK Skeleton: Absent dentition. Right TMJ degeneration. Multilevel chronic disc and  endplate degeneration in the spine. Sequela of median sternotomy. No acute osseous abnormality identified. Mucosal thickening and bubbly opacity in the right maxillary sinus. Other neck: In the visualized upper chest there is situs inversus with right-sided arch, mirror image branching. Sequelae of CABG. No superior mediastinal lymphadenopathy. Negative lung apices. Negative thyroid, larynx, pharynx, parapharyngeal spaces, retropharyngeal space, sublingual space, submandibular glands and parotid glands. No cervical lymphadenopathy. Aortic arch: Right side arch with mirror-image branching. Up to moderate for age soft and calcified plaque at the great vessel origins. No great vessel origin stenosis. Right carotid system: Soft and calcified plaque throughout the anterior right CCA resulting an less than 50 % stenosis with respect to the distal vessel. Soft and calcified plaque at the right carotid bifurcation resulting in 65 % stenosis with respect to the distal vessel (series 404 image 83. Negative cervical right ICA otherwise. Left carotid system: No left CCA origin stenosis. Intermittent calcified plaque predominantly in the anterior left CCA without stenosis. At the left carotid bifurcation there is soft and calcified plaque resulting in less than 50 % stenosis with respect to the distal vessel. However, distal to the bulb there is circumferential soft plaque resulting in a diminutive caliber of the left ICA, which appears moderately to severely stenotic at the C1-C2 level (series 401 image 109). The vessel remains patent to the skullbase. Vertebral arteries:No proximal subclavian artery stenosis. Mild soft and calcified plaque at both vertebral artery origins with up to mild origin stenosis. Otherwise negative fairly codominant vertebral arteries to the skullbase. CTA HEAD Posterior circulation: Calcified right V4 segment plaque with no hemodynamically significant stenosis. As the left vertebral crosses the dura  there is soft and calcified plaque resulting in moderate to severe stenosis which coincides with the left PICA origin which remains patent (series 403, image 141). Additional calcified plaque in the left V4 segment with moderate to severe stenosis on image 137. The distal left vertebral artery remains patent to the basilar. No basilar artery stenosis. Patent AICA and SCA origins. Mild irregularity at both PCA origins without stenosis. Posterior communicating arteries are diminutive or absent. Left PCA branches are within normal limits. There is moderate right P2 segment stenosis (series 406, image 22) with otherwise negative right PCA branches. Anterior circulation: Severe left ICA siphon soft and calcified plaque such that the vessel appears functionally occluded in the siphon to the mid left supraclinoid segment. Reconstituted flow at the left ICA terminus. Normal left MCA and ACA origins. Severe calcified atherosclerosis of the right siphon, especially in the cavernous and supraclinoid segments. Tandem moderate to severe stenosis results. Right ICA terminus remains patent. Normal right MCA and ACA origins. Anterior communicating artery and proximal A2 segments are normal. Distal  A2 segment irregularity. There is calcified plaque at the left callosomarginal artery origin with moderate to severe stenosis (series 406, image 24). Left MCA M1 segment, bifurcation, and left MCA branches are within normal limits. Right MCA M1 segment, bifurcation, and right MCA branches are within normal limits. Venous sinuses: Patent. Anatomic variants: Chest situs inversus. Right side aortic arch with mirror image branching. Delayed phase: No cortically based acute infarct identified. No abnormal enhancement identified. IMPRESSION: 1. 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 2. 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 Reconstituted flow at the left ICA terminus such that the left MCA and ACA origins remain normal. 3. Very advanced for age atherosclerosis in the head and neck. Additional atherosclerotic moderate or severe stenosis: - Right ICA origin (65%), - Right ICA siphon, - Left Vertebral artery V4 segment, - Right PCA P2 segment (moderate) - Left ACA callosomarginal artery origin. 4.  No cortically based acute infarct identified. #2 and other salient findings discussed with Neurology Nandigam, Philis Fendt, MD on 03/20/2016 at 16:06 . Electronically Signed   By: Odessa Fleming M.D.   On: 03/20/2016 16:08   Dg Chest 2 View  02/27/2016  CLINICAL DATA:  Shortness of breath.  CHF.  Situs inversus. EXAM: CHEST  2 VIEW COMPARISON:  None. FINDINGS: Sternotomy wires are aligned. There are discontinuities of the 2 lower most sternotomy wires. CABG clips overlie the mediastinum. There is dextrocardia with mild cardiomegaly. The mediastinal contour otherwise appears normal. No pneumothorax. Trace bilateral pleural effusions. Borderline mild pulmonary edema. Curvilinear opacities at the right lung base. IMPRESSION: 1. Dextrocardia with mild cardiomegaly and borderline mild pulmonary edema, suggesting mild congestive heart failure. 2. Trace bilateral pleural effusions. 3. Curvilinear opacities at the right lung base, favor mild scarring or atelectasis. Electronically Signed   By: Delbert Phenix M.D.   On: 02/27/2016 21:05   Ct Head Wo Contrast  03/20/2016  CLINICAL DATA:  Episode of aphasia and RIGHT side weakness on Saturday lasting 15-20 minutes, stroke symptoms, history hypertension, type II diabetes mellitus, asthma, coronary artery disease post CABG, hypercholesterolemia EXAM: CT HEAD WITHOUT CONTRAST TECHNIQUE: Contiguous axial images were obtained from the base of the skull through the vertex without intravenous contrast. COMPARISON:  None FINDINGS: Normal ventricular morphology. No midline shift  or mass effect. Normal appearance of brain parenchyma. No intracranial hemorrhage, mass lesion or evidence acute infarction. No extra-axial fluid collections. Mucosal thickening and minimal fluid RIGHT maxillary sinus. Visualized paranasal sinuses and mastoid air cells otherwise clear. Atherosclerotic calcifications of internal carotid and vertebral arteries at skullbase. No acute osseous findings. IMPRESSION: No acute intracranial abnormalities. Mild RIGHT maxillary sinus disease. Electronically Signed   By: Ulyses Southward M.D.   On: 03/20/2016 11:43   Ct Angio Neck W/cm &/or Wo/cm  03/20/2016  CLINICAL DATA:  60 year old male with stroke like symptoms several days ago including right side weakness and slurred speech. Initial encounter. EXAM: CT ANGIOGRAPHY HEAD AND NECK TECHNIQUE: Multidetector CT imaging of the head and neck was performed using the standard protocol during bolus administration of intravenous contrast. Multiplanar CT image reconstructions and MIPs were obtained to evaluate the vascular anatomy. Carotid stenosis measurements (when applicable) are obtained utilizing NASCET criteria, using the distal internal carotid diameter as the denominator. CONTRAST:  50 mL Isovue 370 COMPARISON:  Head CT without contrast 1133 hours today. FINDINGS: CTA NECK  Skeleton: Absent dentition. Right TMJ degeneration. Multilevel chronic disc and endplate degeneration in the spine. Sequela of median sternotomy. No acute osseous abnormality identified. Mucosal thickening and bubbly opacity in the right maxillary sinus. Other neck: In the visualized upper chest there is situs inversus with right-sided arch, mirror image branching. Sequelae of CABG. No superior mediastinal lymphadenopathy. Negative lung apices. Negative thyroid, larynx, pharynx, parapharyngeal spaces, retropharyngeal space, sublingual space, submandibular glands and parotid glands. No cervical lymphadenopathy. Aortic arch: Right side arch with mirror-image  branching. Up to moderate for age soft and calcified plaque at the great vessel origins. No great vessel origin stenosis. Right carotid system: Soft and calcified plaque throughout the anterior right CCA resulting an less than 50 % stenosis with respect to the distal vessel. Soft and calcified plaque at the right carotid bifurcation resulting in 65 % stenosis with respect to the distal vessel (series 404 image 83. Negative cervical right ICA otherwise. Left carotid system: No left CCA origin stenosis. Intermittent calcified plaque predominantly in the anterior left CCA without stenosis. At the left carotid bifurcation there is soft and calcified plaque resulting in less than 50 % stenosis with respect to the distal vessel. However, distal to the bulb there is circumferential soft plaque resulting in a diminutive caliber of the left ICA, which appears moderately to severely stenotic at the C1-C2 level (series 401 image 109). The vessel remains patent to the skullbase. Vertebral arteries:No proximal subclavian artery stenosis. Mild soft and calcified plaque at both vertebral artery origins with up to mild origin stenosis. Otherwise negative fairly codominant vertebral arteries to the skullbase. CTA HEAD Posterior circulation: Calcified right V4 segment plaque with no hemodynamically significant stenosis. As the left vertebral crosses the dura there is soft and calcified plaque resulting in moderate to severe stenosis which coincides with the left PICA origin which remains patent (series 403, image 141). Additional calcified plaque in the left V4 segment with moderate to severe stenosis on image 137. The distal left vertebral artery remains patent to the basilar. No basilar artery stenosis. Patent AICA and SCA origins. Mild irregularity at both PCA origins without stenosis. Posterior communicating arteries are diminutive or absent. Left PCA branches are within normal limits. There is moderate right P2 segment stenosis  (series 406, image 22) with otherwise negative right PCA branches. Anterior circulation: Severe left ICA siphon soft and calcified plaque such that the vessel appears functionally occluded in the siphon to the mid left supraclinoid segment. Reconstituted flow at the left ICA terminus. Normal left MCA and ACA origins. Severe calcified atherosclerosis of the right siphon, especially in the cavernous and supraclinoid segments. Tandem moderate to severe stenosis results. Right ICA terminus remains patent. Normal right MCA and ACA origins. Anterior communicating artery and proximal A2 segments are normal. Distal A2 segment irregularity. There is calcified plaque at the left callosomarginal artery origin with moderate to severe stenosis (series 406, image 24). Left MCA M1 segment, bifurcation, and left MCA branches are within normal limits. Right MCA M1 segment, bifurcation, and right MCA branches are within normal limits. Venous sinuses: Patent. Anatomic variants: Chest situs inversus. Right side aortic arch with mirror image branching. Delayed phase: No cortically based acute infarct identified. No abnormal enhancement identified. IMPRESSION: 1. 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 2. 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 Reconstituted flow at the left ICA terminus such  that the left MCA and ACA origins remain normal. 3. Very advanced for age atherosclerosis in the head and neck. Additional atherosclerotic moderate or severe stenosis: - Right ICA origin (65%), - Right ICA siphon, - Left Vertebral artery V4 segment, - Right PCA P2 segment (moderate) - Left ACA callosomarginal artery origin. 4.  No cortically based acute infarct identified. #2 and other salient findings discussed with Neurology Nandigam, Philis Fendt, MD on 03/20/2016 at 16:06 . Electronically  Signed   By: Odessa Fleming M.D.   On: 03/20/2016 16:08   Mr Brain Wo Contrast  03/20/2016  CLINICAL DATA:  60 year old diabetic hypertensive male with episode of aphasia and right-sided weakness. Subsequent encounter. EXAM: MRI HEAD WITHOUT CONTRAST TECHNIQUE: Multiplanar, multiecho pulse sequences of the brain and surrounding structures were obtained without intravenous contrast. COMPARISON:  CT angiogram head and neck 03/20/2016 FINDINGS: No acute infarct or intracranial hemorrhage. Abnormal appearance of the left internal carotid artery. Please see CT angiogram report. Mild chronic microvascular changes most notable left parietal lobe. Right parietal region slightly prominent cerebral spinal fluid space. This measures 2.8 x 2 x 0.9 cm and may represent a small arachnoid cyst given the slight thinning of the adjacent inner cortex of the calvarium. Adjacent prominent sulcus without other findings to suggest this is related to schizencephaly. No hydrocephalus. Cervical medullary junction unremarkable. Slightly small pituitary gland. Moderate mucosal thickening right maxillary sinus. IMPRESSION: No acute infarct or intracranial hemorrhage. Abnormal appearance of the left internal carotid artery. Please see CT angiogram report. Mild chronic microvascular changes most notable left parietal lobe. Right parietal region small arachnoid cyst may be present as noted above. Moderate mucosal thickening right maxillary sinus. Electronically Signed   By: Lacy Duverney M.D.   On: 03/20/2016 16:56    2D Echo:  03/21/16 Study Conclusions - Left ventricle: The cavity size was normal. Systolic function was  normal. The estimated ejection fraction was 15%. Severe diffuse  hypokinesis with regional variations. There is akinesis of the  entireapical myocardium. There is akinesis of the  mid-apicallateral myocardium. - Atrial septum: No defect or patent foramen ovale was identified. - Impressions: Limited study. Severe LVE.  EF 15% Diffuse  hypokinesis  RV normal size  Bubble study negative for right to left shunt.  Impressions: - Limited study. Severe LVE. EF 15% Diffuse hypokinesis  RV normal size  Bubble study negative for right to left shunt.   Admission HPI: Travis Tran is a 60 yo male with CAD s/p CABG (2011), DMII, severe HFrEF (EF 10%, grade 3 diastolic dysfunction, March 2017), and situs inversus. He was recently discharged on 4/2 due to HFrEF exacerbation. He went to cardiology follow up today, when he told the pharmacist/physician about an episode that occurred on Saturday when he became dizzy with right leg weakness and slurred speech that progressed to the inability to form words. He was able to understand his friend at the time, but was not able to speak fluently. The symptoms lasted 20 minutes before fully resolving. He has been taking his ASA 81 mg daily since discharge. He did not seek medical care. Episodes like this have never occurred before. He denies LOC, CP, SOB, palpitations, seizure, tongue biting, or loss of bowel/bladder control. He does not use tobacco or alcohol, but uses marijuana recreationally.  His HFrEF has been stable since discharge. He is managed on ASA, Atorvastatin, Coreg, Lasix, and Lisinopril. His cardiologist has been considering starting Spironolactone, but has not started yet. Catheterization at last hospitalization  revealed severe CAD of native arteries and grafts without PCI targets.   In the ED, CT head was negative. He was started on Plavix.  Hospital Course by problem list:   TIA: Patient presented with complaints of dizziness, right leg weakness, and slurred speech that lasted for 20 minutes before fully resolving that occurred a few days prior to admission. CT head and MRI brain were negative for an acute infarct. However, CTA angio head/neck showed high-grade stenosis of the left ICA as well as significant stenosis on the right side. IR was  consulted and placed a right ICA intracranial stent for his severe stenosis since the left side was not amenable to intervention. He tolerated the procedure well with no new neurologic symptoms. He was discharged on ASA 325 mg daily and Plavix. He will follow with Neurology and his PCP.   Discharge Vitals:   BP 113/75 mmHg  Pulse 71  Temp(Src) 98.1 F (36.7 C) (Oral)  Resp 10  Ht 5\' 6"  (1.676 m)  Wt 153 lb (69.4 kg)  BMI 24.71 kg/m2  SpO2 97% Physical Exam: General: alert, sitting up in bed, NAD HEENT: [place]/AT, PERRL, EOMI, sclera anicteric, mucus membranes moist Cardiovascular: RRR, no m/g/r Pulm: CTA bilaterally, breaths non-labored  Abd: BS+, soft, non-tender  Msk: no peripheral edema Neuro: He is alert and oriented to person, place, and time.  CN II-XII intact to bedside testing. Strength 5/5 in bilateral upper and lower extremities. Sensation grossly intact in bilateral upper and lower extremities.  Skin: Skin is warm and dry. He is not diaphoretic.   Discharge Labs:  Results for orders placed or performed during the hospital encounter of 03/20/16 (from the past 24 hour(s))  Glucose, capillary     Status: Abnormal   Collection Time: 03/23/16 11:14 AM  Result Value Ref Range   Glucose-Capillary 120 (H) 65 - 99 mg/dL  POCT Activated clotting time     Status: None   Collection Time: 03/23/16  1:49 PM  Result Value Ref Range   Activated Clotting Time 214 seconds  POCT Activated clotting time     Status: None   Collection Time: 03/23/16  2:41 PM  Result Value Ref Range   Activated Clotting Time 240 seconds  POCT Activated clotting time     Status: None   Collection Time: 03/23/16  3:14 PM  Result Value Ref Range   Activated Clotting Time 229 seconds  Glucose, capillary     Status: Abnormal   Collection Time: 03/23/16  4:05 PM  Result Value Ref Range   Glucose-Capillary 157 (H) 65 - 99 mg/dL  Glucose, capillary     Status: None   Collection Time: 03/23/16 10:08 PM    Result Value Ref Range   Glucose-Capillary 95 65 - 99 mg/dL  Heparin level (unfractionated)     Status: Abnormal   Collection Time: 03/24/16 12:00 AM  Result Value Ref Range   Heparin Unfractionated <0.10 (L) 0.30 - 0.70 IU/mL  CBC WITH DIFFERENTIAL     Status: Abnormal   Collection Time: 03/24/16  4:00 AM  Result Value Ref Range   WBC 11.0 (H) 4.0 - 10.5 K/uL   RBC 5.13 4.22 - 5.81 MIL/uL   Hemoglobin 13.6 13.0 - 17.0 g/dL   HCT 20.2 33.4 - 35.6 %   MCV 82.5 78.0 - 100.0 fL   MCH 26.5 26.0 - 34.0 pg   MCHC 32.2 30.0 - 36.0 g/dL   RDW 86.1 68.3 - 72.9 %   Platelets 261  150 - 400 K/uL   Neutrophils Relative % 65 %   Lymphocytes Relative 24 %   Monocytes Relative 9 %   Eosinophils Relative 2 %   Basophils Relative 0 %   Neutro Abs 7.2 1.7 - 7.7 K/uL   Lymphs Abs 2.6 0.7 - 4.0 K/uL   Monocytes Absolute 1.0 0.1 - 1.0 K/uL   Eosinophils Absolute 0.2 0.0 - 0.7 K/uL   Basophils Absolute 0.0 0.0 - 0.1 K/uL   WBC Morphology ATYPICAL LYMPHOCYTES   Basic metabolic panel     Status: Abnormal   Collection Time: 03/24/16  4:00 AM  Result Value Ref Range   Sodium 138 135 - 145 mmol/L   Potassium 4.1 3.5 - 5.1 mmol/L   Chloride 102 101 - 111 mmol/L   CO2 26 22 - 32 mmol/L   Glucose, Bld 90 65 - 99 mg/dL   BUN 16 6 - 20 mg/dL   Creatinine, Ser 0.34 0.61 - 1.24 mg/dL   Calcium 8.7 (L) 8.9 - 10.3 mg/dL   GFR calc non Af Amer >60 >60 mL/min   GFR calc Af Amer >60 >60 mL/min   Anion gap 10 5 - 15  Glucose, capillary     Status: None   Collection Time: 03/24/16  8:25 AM  Result Value Ref Range   Glucose-Capillary 71 65 - 99 mg/dL    Signed: Su Hoff, MD 03/24/2016, 11:12 AM    Services Ordered on Discharge: None Equipment Ordered on Discharge: None

## 2016-03-24 NOTE — Progress Notes (Addendum)
STROKE TEAM PROGRESS NOTE   SUBJECTIVE (INTERVAL HISTORY) Son at bedside. He has been stable overnight. Had cerebral angioplasty by Dr. Corliss Skains showed left ICA occlusion. He stated that his BP was low at 90s when he had aphasia episode on 03/17/16. Currently his BP at 100s, and he is on lisinopril low dose but lasix 40mg  bid as well as spironolactone. He is following with Dr. in cardiology. Discussed with IM service regarding BP management.  OBJECTIVE Temp:  [97.2 F (36.2 C)-98.5 F (36.9 C)] 98.1 F (36.7 C) (04/22 0000) Pulse Rate:  [71-88] 71 (04/22 0700) Cardiac Rhythm:  [-] Normal sinus rhythm (04/21 2000) Resp:  [9-23] 10 (04/22 0700) BP: (98-138)/(67-90) 113/75 mmHg (04/22 0700) SpO2:  [94 %-100 %] 97 % (04/22 0700) Arterial Line BP: (117-166)/(52-76) 131/53 mmHg (04/22 0700) Weight:  [69.4 kg (153 lb)] 69.4 kg (153 lb) (04/22 0400)  CBC:   Recent Labs Lab 03/20/16 1052  03/23/16 0030 03/24/16 0400  WBC 10.7*  < > 9.3 11.0*  NEUTROABS 7.2  --   --  PENDING  HGB 13.2  < > 13.0 13.6  HCT 42.9  < > 41.3 42.3  MCV 83.0  < > 81.9 82.5  PLT 314  < > 250 261  < > = values in this interval not displayed.  Basic Metabolic Panel:   Recent Labs Lab 03/23/16 0030 03/24/16 0400  NA 136 138  K 3.8 4.1  CL 101 102  CO2 26 26  GLUCOSE 170* 90  BUN 19 16  CREATININE 0.98 0.88  CALCIUM 9.0 8.7*    Lipid Panel:     Component Value Date/Time   CHOL 191 02/28/2016 0524   TRIG 110 02/28/2016 0524   HDL 49 02/28/2016 0524   CHOLHDL 3.9 02/28/2016 0524   VLDL 22 02/28/2016 0524   LDLCALC 120* 02/28/2016 0524   HgbA1c:  Lab Results  Component Value Date   HGBA1C 15.1* 02/27/2016   Urine Drug Screen: No results found for: LABOPIA, COCAINSCRNUR, LABBENZ, AMPHETMU, THCU, LABBARB    IMAGING I have personally reviewed the radiological images below and agree with the radiology interpretations.  Ct Head Wo Contrast 03/20/2016   No acute intracranial abnormalities.  Mild RIGHT maxillary sinus disease.   Ct Angio Head & Neck W/cm &/or Wo/cm 03/20/2016   1. 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 2. 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 Reconstituted flow at the left ICA terminus such that the left MCA and ACA origins remain normal. 3. Very advanced for age atherosclerosis in the head and neck. Additional atherosclerotic moderate or severe stenosis: - Right ICA origin (65%), - Right ICA siphon, - Left Vertebral artery V4 segment, - Right PCA P2 segment (moderate) - Left ACA callosomarginal artery origin. 4.  No cortically based acute infarct identified.   Mr Brain Wo Contrast 03/20/2016   No acute infarct or intracranial hemorrhage. Abnormal appearance of the left internal carotid artery. Please see CT angiogram report. Mild chronic microvascular changes most notable left parietal lobe. Right parietal region small arachnoid cyst may be present as noted above. Moderate mucosal thickening right maxillary sinus.   Cerebral angio 1.Occluded LT ICA at the cavernous seg at the opthalmic artery level. 2.Approx 80 % to 85% stenosis of RT ICA petrous -cavernous junction. 3.Approx 75 to 80% stenosis of LT VBJ distal to LT PICA  origin.   PHYSICAL EXAM Middle-age Caucasian male currently not in distress. . Afebrile. Head is nontraumatic. Neck is supple without bruit.    Cardiac exam no murmur or gallop. Lungs are clear to auscultation. Distal pulses are well felt. Neurological Exam ;  Awake  Alert oriented x 3. Normal speech and language.eye movements full without nystagmus.fundi were not visualized. Vision acuity and fields appear normal. Hearing is normal. Palatal movements are normal. Face symmetric. Tongue midline. Normal strength, tone, reflexes and coordination. Normal sensation. Gait deferred. ASSESSMENT/PLAN Mr.  Travis Tran is a 60 y.o. male with history of situs inversus, DM, HTN, CAD, high cholesterol, s/p CABG x5, and a recent diagnosis of CHF w/known EF 10% presenting with motor aphasia with garbled speech and R sided weakness and numbness 3 days ago. He did not receive IV t-PA due to delay in arrrival.   Left brain TIA in setting of left ICA occlusion and right ICA petrous portion high grade stenosis S/P RT ICA intracranial stent assisted angioplasty.  MRI  No acute infarct  CTA head and neck  Situs inversus. L ICA high grade stenosis vs Trickle flow reconstituted at terminus. Right ICA origin 65%  Cerebal angio - left ICA occluded and right ICA petrous portion 80-85% stenosis  CUS - Right 60% to 79% ICA stenosis. Vertebral artery flow is antegrade.Left- Difficult to evaluate the level of stenosis. There is moderate heterogeneous plaque at the bifurcation/proximal ICA. From this point the is diminished "spiked " velocities and loss of diastolic flow. Cannot rule out a more distal obstruction. Vertebral artery flow is antegrade.  2D Echo  EF 15%.   LDL 120 on 02/28/2016  HgbA1c 15.1 on 02/27/2016   Lovenox 40 mg sq daily for VTE prophylaxis Diet heart healthy/carb modified Room service appropriate?: Yes; Fluid consistency:: Thin  aspirin 81 mg daily prior to admission, now on aspirin 81 mg daily and clopidogrel 75 mg daily  Ongoing aggressive stroke risk factor management  Therapy recommendations:  pending   Disposition:  pending   Cardiomyopathy   EF 15% on 2D echo  Follows with Dr. Katrinka Blazing in cardiology  On lasix, spironolactone and lisinopril  Avoid hypotension   Hypotension  SBP on the low side, from 90s to 110s  BP goal 120-140 due to left ICA occlusion  Recommend compression socks   Hyperlipidemia  Home meds:  lipitor 80, resumed in hospital  LDL 120 on 02/28/2016, goal < 70  Continue statin at discharge  Diabetes type II  HgbA1c 15.1 on 02/27/2016, goal <  7.0  Very poorly controlled  Need DM education  Close follow up with PCP and endocrinologist.   Other Stroke Risk Factors  Marijuana use  Coronary artery disease s/p CABG  Other Active Problems  Situs inversus with dextrocardia  Asthma  GERD  RA  Psoriatic arthritis  Anxiety     Hospital day #   Neurology will sign off. Please call with questions. Pt will follow up with Dr. Roda Shutters at [facility] in about 2 months. Thanks for the consult.  Marvel Plan, MD PhD Stroke Neurology 03/25/2016 12:00 AM    To contact Stroke Continuity provider, please refer to [email or web address]. After hours, contact General Neurology

## 2016-03-24 NOTE — Progress Notes (Signed)
ANTICOAGULATION CONSULT NOTE - Follow Up Consult  Pharmacy Consult for heparin Indication: post RT ICA stent assisted angioplasty   Labs:  Recent Labs  03/21/16 0242 03/21/16 1623 03/22/16 1207 03/22/16 1415 03/23/16 0030 03/24/16  HGB 12.5*  --   --   --  13.0  --   HCT 40.5  --   --   --  41.3  --   PLT 259  --   --   --  250  --   HEPARINUNFRC  --   --   --   --   --  <0.10*  CREATININE 1.00  --  1.02  --  0.98  --   TROPONINI  --  0.03  --  0.03  --   --     Assessment: 59yo male undetectable on heparin with initial dosing post RT ICA stent assisted angioplasty.  Goal of Therapy:  Heparin level 0.1-0.25 units/ml   Plan:  Will increase heparin gtt by 2 units/kg/hr to 700 units/hr until off at 0700.  Travis Tran, PharmD, BCPS  03/24/2016,1:09 AM

## 2016-03-24 NOTE — Plan of Care (Signed)
Problem: Education: Goal: Knowledge of [facility] Education information/materials will improve Outcome: Completed/Met Date Met:  03/24/16 Pt is not a candidate for [facility]; did not have a stroke

## 2016-03-24 NOTE — Progress Notes (Signed)
Patient ID: Travis Tran, male   DOB: [birth year], 58 y.o.   MRN: [id number]    Referring Physician(s): Dr. Patsey Berthold  Supervising Physician: [name]  Chief Complaint: L ICA stenosis  Subjective: Patient doing well.  Foley just removed.  Good appetite.  No HAs.  Allergies: Celecoxib and Sulfa antibiotics  Medications: Prior to Admission medications   Medication Sig Start Date End Date Taking? Authorizing Provider  aspirin 81 MG EC tablet Take 1 tablet (81 mg total) by mouth daily. 03/04/16  Yes Carly Arlyce Harman, MD  atorvastatin (LIPITOR) 80 MG tablet Take 1 tablet (80 mg total) by mouth daily at 6 PM. 03/04/16  Yes Carly J Rivet, MD  budesonide-formoterol (SYMBICORT) 80-4.5 MCG/ACT inhaler Inhale 2 puffs into the lungs 2 (two) times daily.   Yes Historical Provider, MD  carvedilol (COREG) 6.25 MG tablet Take 1 tablet (6.25 mg total) by mouth 2 (two) times daily with a meal. 03/04/16  Yes Carly J Rivet, MD  furosemide (LASIX) 40 MG tablet Take 1 tablet (40 mg total) by mouth 2 (two) times daily. 03/04/16  Yes Carly Arlyce Harman, MD  insulin aspart (NOVOLOG) 100 UNIT/ML injection Inject 5 Units into the skin 3 (three) times daily with meals. 03/06/16  Yes Pete Glatter, MD  insulin glargine (LANTUS) 100 UNIT/ML injection Inject 0.15 mLs (15 Units total) into the skin at bedtime. Patient taking differently: Inject 10 Units into the skin at bedtime.  03/04/16  Yes Carly Arlyce Harman, MD  lisinopril (PRINIVIL,ZESTRIL) 2.5 MG tablet Take 1 tablet (2.5 mg total) by mouth daily. 03/04/16  Yes Carly Arlyce Harman, MD  pantoprazole (PROTONIX) 40 MG tablet Take 1 tablet (40 mg total) by mouth daily. 03/04/16  Yes Carly Arlyce Harman, MD  INSULIN SYRINGE .5CC/29G 29G X 1/2" 0.5 ML MISC Use one syringe per insulin injection. 03/04/16   Su Hoff, MD  spironolactone (ALDACTONE) 25 MG tablet Take 0.5 tablets (12.5 mg total) by mouth daily. Patient not taking: Reported on 03/20/2016 03/09/16   Brittainy Sherlynn Carbon, PA-C     Vital Signs: BP 113/75 mmHg  Pulse 71  Temp(Src) 98.1 F (36.7 C) (Oral)  Resp 10  Ht 5\' 6"  (1.676 m)  Wt 153 lb (69.4 kg)  BMI 24.71 kg/m2  SpO2 97%  Physical Exam: Skin: right inguinal site is c/d/i.  No hematoma, soft Neuro: Cranial nerves are intact, he has good strength bilaterally in his upper and lower extremities and is NVI  Imaging: Ct Angio Head W/cm &/or Wo Cm  03/20/2016  CLINICAL DATA:  60 year old male with stroke like symptoms several days ago including right side weakness and slurred speech. Initial encounter. EXAM: CT ANGIOGRAPHY HEAD AND NECK TECHNIQUE: Multidetector CT imaging of the head and neck was performed using the standard protocol during bolus administration of intravenous contrast. Multiplanar CT image reconstructions and MIPs were obtained to evaluate the vascular anatomy. Carotid stenosis measurements (when applicable) are obtained utilizing NASCET criteria, using the distal internal carotid diameter as the denominator. CONTRAST:  50 mL Isovue 370 COMPARISON:  Head CT without contrast 1133 hours today. FINDINGS: CTA NECK Skeleton: Absent dentition. Right TMJ degeneration. Multilevel chronic disc and endplate degeneration in the spine. Sequela of median sternotomy. No acute osseous abnormality identified. Mucosal thickening and bubbly opacity in the right maxillary sinus. Other neck: In the visualized upper chest there is situs inversus with right-sided arch, mirror image branching. Sequelae of CABG. No superior mediastinal lymphadenopathy. Negative lung apices. Negative thyroid, larynx, pharynx,  parapharyngeal spaces, retropharyngeal space, sublingual space, submandibular glands and parotid glands. No cervical lymphadenopathy. Aortic arch: Right side arch with mirror-image branching. Up to moderate for age soft and calcified plaque at the great vessel origins. No great vessel origin stenosis. Right carotid system: Soft and calcified plaque throughout the anterior  right CCA resulting an less than 50 % stenosis with respect to the distal vessel. Soft and calcified plaque at the right carotid bifurcation resulting in 65 % stenosis with respect to the distal vessel (series 404 image 83. Negative cervical right ICA otherwise. Left carotid system: No left CCA origin stenosis. Intermittent calcified plaque predominantly in the anterior left CCA without stenosis. At the left carotid bifurcation there is soft and calcified plaque resulting in less than 50 % stenosis with respect to the distal vessel. However, distal to the bulb there is circumferential soft plaque resulting in a diminutive caliber of the left ICA, which appears moderately to severely stenotic at the C1-C2 level (series 401 image 109). The vessel remains patent to the skullbase. Vertebral arteries:No proximal subclavian artery stenosis. Mild soft and calcified plaque at both vertebral artery origins with up to mild origin stenosis. Otherwise negative fairly codominant vertebral arteries to the skullbase. CTA HEAD Posterior circulation: Calcified right V4 segment plaque with no hemodynamically significant stenosis. As the left vertebral crosses the dura there is soft and calcified plaque resulting in moderate to severe stenosis which coincides with the left PICA origin which remains patent (series 403, image 141). Additional calcified plaque in the left V4 segment with moderate to severe stenosis on image 137. The distal left vertebral artery remains patent to the basilar. No basilar artery stenosis. Patent AICA and SCA origins. Mild irregularity at both PCA origins without stenosis. Posterior communicating arteries are diminutive or absent. Left PCA branches are within normal limits. There is moderate right P2 segment stenosis (series 406, image 22) with otherwise negative right PCA branches. Anterior circulation: Severe left ICA siphon soft and calcified plaque such that the vessel appears functionally occluded in  the siphon to the mid left supraclinoid segment. Reconstituted flow at the left ICA terminus. Normal left MCA and ACA origins. Severe calcified atherosclerosis of the right siphon, especially in the cavernous and supraclinoid segments. Tandem moderate to severe stenosis results. Right ICA terminus remains patent. Normal right MCA and ACA origins. Anterior communicating artery and proximal A2 segments are normal. Distal A2 segment irregularity. There is calcified plaque at the left callosomarginal artery origin with moderate to severe stenosis (series 406, image 24). Left MCA M1 segment, bifurcation, and left MCA branches are within normal limits. Right MCA M1 segment, bifurcation, and right MCA branches are within normal limits. Venous sinuses: Patent. Anatomic variants: Chest situs inversus. Right side aortic arch with mirror image branching. Delayed phase: No cortically based acute infarct identified. No abnormal enhancement identified. IMPRESSION: 1. 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 2. 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 Reconstituted flow at the left ICA terminus such that the left MCA and ACA origins remain normal. 3. Very advanced for age atherosclerosis in the head and neck. Additional atherosclerotic moderate or severe stenosis: - Right ICA origin (65%), - Right ICA siphon, - Left Vertebral artery V4 segment, - Right PCA P2 segment (moderate) - Left ACA callosomarginal artery origin. 4.  No cortically based acute infarct identified. #2 and other salient  findings discussed with Neurology Nandigam, Philis Fendt, MD on 03/20/2016 at 16:06 . Electronically Signed   By: Odessa Fleming M.D.   On: 03/20/2016 16:08   Ct Head Wo Contrast  03/20/2016  CLINICAL DATA:  Episode of aphasia and RIGHT side weakness on Saturday lasting 15-20 minutes, stroke  symptoms, history hypertension, type II diabetes mellitus, asthma, coronary artery disease post CABG, hypercholesterolemia EXAM: CT HEAD WITHOUT CONTRAST TECHNIQUE: Contiguous axial images were obtained from the base of the skull through the vertex without intravenous contrast. COMPARISON:  None FINDINGS: Normal ventricular morphology. No midline shift or mass effect. Normal appearance of brain parenchyma. No intracranial hemorrhage, mass lesion or evidence acute infarction. No extra-axial fluid collections. Mucosal thickening and minimal fluid RIGHT maxillary sinus. Visualized paranasal sinuses and mastoid air cells otherwise clear. Atherosclerotic calcifications of internal carotid and vertebral arteries at skullbase. No acute osseous findings. IMPRESSION: No acute intracranial abnormalities. Mild RIGHT maxillary sinus disease. Electronically Signed   By: Ulyses Southward M.D.   On: 03/20/2016 11:43   Ct Angio Neck W/cm &/or Wo/cm  03/20/2016  CLINICAL DATA:  60 year old male with stroke like symptoms several days ago including right side weakness and slurred speech. Initial encounter. EXAM: CT ANGIOGRAPHY HEAD AND NECK TECHNIQUE: Multidetector CT imaging of the head and neck was performed using the standard protocol during bolus administration of intravenous contrast. Multiplanar CT image reconstructions and MIPs were obtained to evaluate the vascular anatomy. Carotid stenosis measurements (when applicable) are obtained utilizing NASCET criteria, using the distal internal carotid diameter as the denominator. CONTRAST:  50 mL Isovue 370 COMPARISON:  Head CT without contrast 1133 hours today. FINDINGS: CTA NECK Skeleton: Absent dentition. Right TMJ degeneration. Multilevel chronic disc and endplate degeneration in the spine. Sequela of median sternotomy. No acute osseous abnormality identified. Mucosal thickening and bubbly opacity in the right maxillary sinus. Other neck: In the visualized upper chest there is situs  inversus with right-sided arch, mirror image branching. Sequelae of CABG. No superior mediastinal lymphadenopathy. Negative lung apices. Negative thyroid, larynx, pharynx, parapharyngeal spaces, retropharyngeal space, sublingual space, submandibular glands and parotid glands. No cervical lymphadenopathy. Aortic arch: Right side arch with mirror-image branching. Up to moderate for age soft and calcified plaque at the great vessel origins. No great vessel origin stenosis. Right carotid system: Soft and calcified plaque throughout the anterior right CCA resulting an less than 50 % stenosis with respect to the distal vessel. Soft and calcified plaque at the right carotid bifurcation resulting in 65 % stenosis with respect to the distal vessel (series 404 image 83. Negative cervical right ICA otherwise. Left carotid system: No left CCA origin stenosis. Intermittent calcified plaque predominantly in the anterior left CCA without stenosis. At the left carotid bifurcation there is soft and calcified plaque resulting in less than 50 % stenosis with respect to the distal vessel. However, distal to the bulb there is circumferential soft plaque resulting in a diminutive caliber of the left ICA, which appears moderately to severely stenotic at the C1-C2 level (series 401 image 109). The vessel remains patent to the skullbase. Vertebral arteries:No proximal subclavian artery stenosis. Mild soft and calcified plaque at both vertebral artery origins with up to mild origin stenosis. Otherwise negative fairly codominant vertebral arteries to the skullbase. CTA HEAD Posterior circulation: Calcified right V4 segment plaque with no hemodynamically significant stenosis. As the left vertebral crosses the dura there is soft and calcified plaque resulting in moderate to severe stenosis which coincides with the left PICA  origin which remains patent (series 403, image 141). Additional calcified plaque in the left V4 segment with moderate to  severe stenosis on image 137. The distal left vertebral artery remains patent to the basilar. No basilar artery stenosis. Patent AICA and SCA origins. Mild irregularity at both PCA origins without stenosis. Posterior communicating arteries are diminutive or absent. Left PCA branches are within normal limits. There is moderate right P2 segment stenosis (series 406, image 22) with otherwise negative right PCA branches. Anterior circulation: Severe left ICA siphon soft and calcified plaque such that the vessel appears functionally occluded in the siphon to the mid left supraclinoid segment. Reconstituted flow at the left ICA terminus. Normal left MCA and ACA origins. Severe calcified atherosclerosis of the right siphon, especially in the cavernous and supraclinoid segments. Tandem moderate to severe stenosis results. Right ICA terminus remains patent. Normal right MCA and ACA origins. Anterior communicating artery and proximal A2 segments are normal. Distal A2 segment irregularity. There is calcified plaque at the left callosomarginal artery origin with moderate to severe stenosis (series 406, image 24). Left MCA M1 segment, bifurcation, and left MCA branches are within normal limits. Right MCA M1 segment, bifurcation, and right MCA branches are within normal limits. Venous sinuses: Patent. Anatomic variants: Chest situs inversus. Right side aortic arch with mirror image branching. Delayed phase: No cortically based acute infarct identified. No abnormal enhancement identified. IMPRESSION: 1. 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 2. 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 Reconstituted flow at the left ICA terminus such that the left MCA and ACA origins remain normal. 3. Very advanced for age atherosclerosis in the head and neck. Additional atherosclerotic moderate or  severe stenosis: - Right ICA origin (65%), - Right ICA siphon, - Left Vertebral artery V4 segment, - Right PCA P2 segment (moderate) - Left ACA callosomarginal artery origin. 4.  No cortically based acute infarct identified. #2 and other salient findings discussed with Neurology Nandigam, Philis Fendt, MD on 03/20/2016 at 16:06 . Electronically Signed   By: Odessa Fleming M.D.   On: 03/20/2016 16:08   Mr Brain Wo Contrast  03/20/2016  CLINICAL DATA:  60 year old diabetic hypertensive male with episode of aphasia and right-sided weakness. Subsequent encounter. EXAM: MRI HEAD WITHOUT CONTRAST TECHNIQUE: Multiplanar, multiecho pulse sequences of the brain and surrounding structures were obtained without intravenous contrast. COMPARISON:  CT angiogram head and neck 03/20/2016 FINDINGS: No acute infarct or intracranial hemorrhage. Abnormal appearance of the left internal carotid artery. Please see CT angiogram report. Mild chronic microvascular changes most notable left parietal lobe. Right parietal region slightly prominent cerebral spinal fluid space. This measures 2.8 x 2 x 0.9 cm and may represent a small arachnoid cyst given the slight thinning of the adjacent inner cortex of the calvarium. Adjacent prominent sulcus without other findings to suggest this is related to schizencephaly. No hydrocephalus. Cervical medullary junction unremarkable. Slightly small pituitary gland. Moderate mucosal thickening right maxillary sinus. IMPRESSION: No acute infarct or intracranial hemorrhage. Abnormal appearance of the left internal carotid artery. Please see CT angiogram report. Mild chronic microvascular changes most notable left parietal lobe. Right parietal region small arachnoid cyst may be present as noted above. Moderate mucosal thickening right maxillary sinus. Electronically Signed   By: Lacy Duverney M.D.   On: 03/20/2016 16:56    Labs:  CBC:  Recent Labs  03/20/16  1052 03/20/16 1125 03/21/16 0242  03/23/16 0030 03/24/16 0400  WBC 10.7*  --  8.0 9.3 11.0*  HGB 13.2 15.6 12.5* 13.0 13.6  HCT 42.9 46.0 40.5 41.3 42.3  PLT 314  --  259 250 261    COAGS:  Recent Labs  03/01/16 0606 03/20/16 1052  INR 1.07 1.08  APTT  --  32    BMP:  Recent Labs  03/21/16 0242 03/22/16 1207 03/23/16 0030 03/24/16 0400  NA 139 137 136 138  K 4.0 4.3 3.8 4.1  CL 103 102 101 102  CO2 27 25 26 26   GLUCOSE 310* 246* 170* 90  BUN 23* 18 19 16   CALCIUM 9.1 8.7* 9.0 8.7*  CREATININE 1.00 1.02 0.98 0.88  GFRNONAA >60 >60 >60 >60  GFRAA >60 >60 >60 >60    LIVER FUNCTION TESTS:  Recent Labs  02/27/16 1607 02/29/16 0741 03/20/16 1052  BILITOT 0.8 0.6 0.5  AST 23 20 20   ALT 20 16* 17  ALKPHOS 94 83 81  PROT 5.7* 5.1* 6.7  ALBUMIN 2.6* 2.1* 2.7*    Assessment and Plan: 1. S/p cerebral angio with stenting and angioplasty of the L ICA secondary to stenosis -get a stat P2Y12 today -if he is able to void and otherwise stable medically, he is stable from our standpoint for DC home -he will need to go home on ASA 325mg  and Plavix 75mg  -we will arrange follow up with a repeat P2Y12 in 2 weeks and follow up with Dr. 03/02/16 as well.  Electronically Signed: 03/22/16 03/24/2016, 10:28 AM   I spent a total of 15 Minutes at the the patient's bedside AND on the patient's hospital floor or unit, greater than 50% of which was counseling/coordinating care for L ICA stenosis

## 2016-03-24 NOTE — Progress Notes (Signed)
Subjective: No events overnight. He was in a great mood this morning. He has a good appetite. He had a headache last night that went away with Tylenol. No abdominal pain, N/V.  Objective: Vital signs in last 24 hours: Filed Vitals:   03/24/16 0600 03/24/16 0700 03/24/16 0800 03/24/16 0951  BP: 113/75 113/75    Pulse: 71 71    Temp:   98.1 F (36.7 C)   TempSrc:   Oral   Resp: 11 10    Height:      Weight:      SpO2: 98% 97%  97%   Weight change: 2.358 kg (5 lb 3.2 oz)  Intake/Output Summary (Last 24 hours) at 03/24/16 1029 Last data filed at 03/24/16 0700  Gross per 24 hour  Intake 2117.74 ml  Output   2860 ml  Net -742.26 ml   Physical Exam: GEN: pleasant, NAD EYES: 20/35 in both eyes, no scleral icterus CV: RRR, no murmurs PULM: CTAB, NWOB ABD: normoactive bowel sounds, non-distended EXT: warm, on periperal edema NEURO: alert and oriented x 3. CN intact, normal sensation and 5/5 strength bilaterally in upper and lower extremities.   Lab Results: Heparin < 0.10  Capillary glucose: 71 BMP Latest Ref Rng 03/24/2016 03/23/2016 03/22/2016  Glucose 65 - 99 mg/dL 90 500(X) 381(W)  BUN 6 - 20 mg/dL 16 19 18   Creatinine 0.61 - 1.24 mg/dL 2.99 3.71 6.96  Sodium 135 - 145 mmol/L 138 136 137  Potassium 3.5 - 5.1 mmol/L 4.1 3.8 4.3  Chloride 101 - 111 mmol/L 102 101 102  CO2 22 - 32 mmol/L 26 26 25   Calcium 8.9 - 10.3 mg/dL 7.8(L) 9.0 3.8(B)   CBC Latest Ref Rng 03/24/2016 03/23/2016 03/21/2016  WBC 4.0 - 10.5 K/uL 11.0(H) 9.3 8.0  Hemoglobin 13.0 - 17.0 g/dL 01.7 51.0 12.5(L)  Hematocrit 39.0 - 52.0 % 42.3 41.3 40.5  Platelets 150 - 400 K/uL 261 250 259   . Micro Results: Recent Results (from the past 240 hour(s))  Surgical pcr screen     Status: Abnormal   Collection Time: 03/22/16 10:25 PM  Result Value Ref Range Status   MRSA, PCR POSITIVE (A) NEGATIVE Final    Comment: RESULT CALLED TO, READ BACK BY AND VERIFIED WITH: A SWARZ,RN @0211  03/23/16 MKELLY    Staphylococcus aureus POSITIVE (A) NEGATIVE Final    Comment:        The Xpert SA Assay (FDA approved for NASAL specimens in patients over 62 years of age), is one component of a comprehensive surveillance program.  Test performance has been validated by [facility] for patients greater than or equal to 35 year old. It is not intended to diagnose infection nor to guide or monitor treatment.    Studies/Results: No results found. Medications:  Scheduled Meds: . aspirin  325 mg Oral Q breakfast  . atorvastatin  80 mg Oral q1800  . clopidogrel  75 mg Oral Q breakfast  . furosemide  40 mg Oral BID  . insulin aspart  0-9 Units Subcutaneous TID WC  . insulin aspart  3 Units Subcutaneous TID WC  . insulin glargine  15 Units Subcutaneous QHS  . lisinopril  2.5 mg Oral Daily  . mometasone-formoterol  2 puff Inhalation BID  . pantoprazole  40 mg Oral Daily  . sodium chloride flush  3 mL Intravenous Q12H  . spironolactone  12.5 mg Oral Daily   Continuous Infusions: . sodium chloride 75 mL/hr at 03/24/16 0841  . lactated  ringers 50 mL/hr (03/23/16 1159)  . 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 No remaining neuro deficits on admission (4/18). HPI not indicative of seizure and no history of seizure.Unlikely tumor or fungal mass because CT showed no mass effect or midline shift. ED EKG showed no a fib, but could be transient.  MRI brain showed no infarct or intracranial hemorrhage. MRI was positive for left parietal chronic microvascular changes. Right parietal lobe was normal. CT angio of neck showed advanced atherosclerosis for age and 65% occlusion of left internal carotid artery. Left MCA and left ACA origins are intact. PT/OT consults on 4/19 showed no deficits. Bubble study on 4/19 showed LVEF 15% with diffuse hypokinesis, RV normal size, no PFO ro R--> L shhunt. EKG on 4/19 showed prolonged QT (QT 454/QTc 500) and sinus rhythm with PVCs and PACs. CT arteriogram on 4/19 showed 80-85% right internal carotid occlusion, 75-80% left VBJ occlusion distal to left PICA and at opthalmic artery. IR placed right carotid stent on 4/21 because right side is more salvagable. P2Y12 levels are below reference range, indicating Clopidogrel is working. No complications of surgery. - Discontinue Teley - Plavix 75 mg daily - ASA 81 mg daily - Atorvastatin 80 mg daily - Send home with Loop or Holter monitor to r/u a fib  Intermittent Bradycardia s/p cerebral angiogram on 4/19: EKG and troponin checked on 4/19. Troponin was 0.03. EKG showed no ischemic changes, but showed changes as above. Coreg was stopped due to bradycardia. Coreg restarted on 4/20. Will be held on the morning of 4/21 because of surgery and restarted that evening. Has not been bradycardic since 4/19.  CHF: Recently diagnosed in March 2017- EF 10% and grade 3 diastolic dysfunction. Says he has been compliant with medicines and follow-up. Continue home meds: - Coreg 6.25 mg BID - Lisinopril 2.5 mg daily - Spironolactone 12.5 mg daily - Lasix 40 mg BID - F/u echo @ 2 months post discharge   Type II DM: It is poorly controlled. Last A1c was 15.1%. He was started on Lantus 15 U qHS and Novolog 3 U qAC TID, last month, so wait to see if new regimen helps. He is also on an irregular eating and insulin regimen in the hospital, so that may be contributing. F/u with PCP to see  if more acute management changes are needed.He has not been to an eye doctor in years, so it was suggested based on his poorly controlled DM.  - SSI - Lantus 15 U qHS - Novolog 3U TID - f/u with ophtho   CKD2: Cr near baseline. Unclear if AKI.GFR > 60. Cr 1.00 on morning of 4/19 and 1.02 on 4/20. - CTM due to contrast dye administration on 4/18 and 4/19.  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 He denies family history of CF. His father had lung cancer. He has never had genetic testing or sperm studies. - CTM  FEN/GI: - HH  DVT Ppx: Lovenox  Dispo: Today if  neuro says ok.  The patient does have a current PCP (Dawn Marland Mcalpine, MD) and does need an [facility] hospital follow-up appointment after discharge.   This is a Psychologist, occupational Note.  The care of the patient was discussed with Dr. Loney Loh and the assessment and plan formulated with their assistance.  Please see their attached note for official documentation of the daily encounter.   LOS: 2 days   Elton Sin, Med Student 03/24/2016, 10:29 AM

## 2016-03-24 NOTE — Discharge Instructions (Signed)
Carotid Angioplasty With Stent, Care After °Refer to this sheet in the next few weeks. These instructions provide you with information about caring for yourself after your procedure. Your health care provider may also give you more specific instructions. Your treatment has been planned according to current medical practices, but problems sometimes occur. Call your health care provider if you have any problems or questions after your procedure. °WHAT TO EXPECT AFTER THE PROCEDURE °After your procedure, it is typical to have the following: °· Bruising at the catheter site that usually fades within 1-2 weeks. °· Blood collecting in the tissue (hematoma) that may be painful to the touch. It should usually decrease in size and tenderness within 1-2 weeks. °HOME CARE INSTRUCTIONS °· Take medicines only as directed by your health care provider. Blood thinners may be prescribed after your procedure to improve blood flow through the stent. °· You may shower 24-48 hours after the procedure or as directed by your health care provider. Remove the bandage (dressing) and gently wash the site with plain soap and water. Pat the area dry with a clean towel. Do not rub the site, because this may cause bleeding. °· Do not take baths, swim, or use a hot tub until your health care provider approves. °· Check your insertion site every day for redness, swelling, or drainage. °· Do not apply powder or lotion to the site. °· Do not lift over 10 lb (4.5 kg) for 5 days after your procedure or as directed by your health care provider. °· Ask your health care provider when it is okay to: °¨ Return to work or school. °¨ Resume usual physical activities or sports. °¨ Resume sexual activity. °· Eat a heart-healthy diet. This should include plenty of fresh fruits and vegetables. Meat should be lean cuts. Avoid the following types of food: °¨ Food that is high in salt. °¨ Canned or highly processed food. °¨ Food that is high in saturated fat or  sugar. °¨ Fried food. °· Make any other lifestyle changes as recommended by your health care provider. These may include: °¨ Not using any tobacco products, including cigarettes, chewing tobacco, or electronic cigarettes. If you need help quitting, ask your health care provider. °¨ Managing your weight. °¨ Getting regular exercise. °¨ Managing your blood pressure. °¨ Limiting your alcohol intake. °¨ Managing other health problems, such as diabetes and sleep apnea. °· Keep all follow-up visits as directed by your health care provider. This is important. °SEEK MEDICAL CARE IF: °· You have a fever. °· You have chills. °· You have increased bleeding from the catheter insertion site. Hold pressure on the site. °SEEK IMMEDIATE MEDICAL CARE IF: °· You have vision changes or loss of vision. °· You have numbness or weakness on one side of your body. °· You have difficulty talking, or you have slurred speech or cannot speak (aphasia). °· You feel confused or have difficulty remembering. °· You have unusual pain at the catheter insertion site. °· You have redness, warmth, or swelling at the catheter insertion site. °· You have drainage (other than a small amount of blood on the dressing) from the catheter insertion site. °· The catheter insertion site is bleeding, and the bleeding does not stop after 30 minutes of holding steady pressure on the site. °These symptoms may represent a serious problem that is an emergency. Do not wait to see if the symptoms will go away. Get medical help right away. Call your local emergency services (911 in [place] Do not   drive yourself to the hospital. °  °This information is not intended to replace advice given to you by your health care provider. Make sure you discuss any questions you have with your health care provider. °  °Document Released: 02/08/2006 Document Revised: 12/10/2014 Document Reviewed: 04/11/2012 °Elsevier Interactive Patient Education ©2016 [employer] ° °

## 2016-03-25 ENCOUNTER — Other Ambulatory Visit: Payer: Self-pay | Admitting: Neurology

## 2016-03-25 DIAGNOSIS — G451 Carotid artery syndrome (hemispheric): Secondary | ICD-10-CM

## 2016-03-25 DIAGNOSIS — I6529 Occlusion and stenosis of unspecified carotid artery: Secondary | ICD-10-CM | POA: Insufficient documentation

## 2016-03-26 ENCOUNTER — Encounter (HOSPITAL_COMMUNITY): Payer: Self-pay | Admitting: Interventional Radiology

## 2016-03-26 LAB — PATHOLOGIST SMEAR REVIEW

## 2016-03-26 MED FILL — CLOPIDOGREL 75 MG TABLET: 75 | 30 days supply | Qty: 30 | Fill #0

## 2016-03-26 NOTE — Anesthesia Postprocedure Evaluation (Signed)
Anesthesia Post Note  Patient: Travis Tran  Procedure(s) Performed: Procedure(s) (LRB): STENT PLACEMENT (N/A)  Patient location during evaluation: PACU Anesthesia Type: General Level of consciousness: awake Pain management: pain level controlled Vital Signs Assessment: post-procedure vital signs reviewed and stable Respiratory status: spontaneous breathing Cardiovascular status: stable Postop Assessment: no signs of nausea or vomiting Anesthetic complications: no    Last Vitals:  Filed Vitals:   03/24/16 1400 03/24/16 1500  BP: 123/83   Pulse: 87 88  Temp:    Resp: 19 31    Last Pain:  Filed Vitals:   03/24/16 1848  PainSc: 0-No pain                 Braylynn Lewing

## 2016-03-27 ENCOUNTER — Other Ambulatory Visit (HOSPITAL_COMMUNITY): Payer: Self-pay | Admitting: Interventional Radiology

## 2016-03-27 ENCOUNTER — Telehealth: Payer: Self-pay

## 2016-03-27 DIAGNOSIS — I771 Stricture of artery: Secondary | ICD-10-CM

## 2016-03-27 NOTE — Telephone Encounter (Signed)
Per Dr.Nahser pt needs a 2-4 wk f/u appt with Dr.Smith. Pt aware appt scheduled with Dr.Smith for 5/8 @ 10:30am

## 2016-04-03 ENCOUNTER — Telehealth (HOSPITAL_COMMUNITY): Payer: Self-pay | Admitting: Interventional Radiology

## 2016-04-03 NOTE — Telephone Encounter (Signed)
Faxed copy of patient's pathology smear review to Dr. Dierdre Searles, MD at [phone]. JM

## 2016-04-04 MED FILL — ?PANTOPRAZOLE SOD DR 40MG: 40 MG | 30 days supply | Qty: 30 | Fill #0 | Status: TO

## 2016-04-04 MED FILL — SPIRONOLACTONE 25 MG TABLET: 25 | 30 days supply | Qty: 15 | Fill #0

## 2016-04-04 MED FILL — ?FUROSEMIDE 40 MG TABLET: 40 | 30 days supply | Qty: 60 | Fill #0 | Status: TO

## 2016-04-04 MED FILL — LISINOPRIL 2.5 MG TABLET: 2.5 | 30 days supply | Qty: 30 | Fill #0

## 2016-04-04 MED FILL — CARVEDILOL 6.25 MG TABLET: 6.25 | 30 days supply | Qty: 60 | Fill #0 | Status: TO

## 2016-04-04 MED FILL — !NOVOLOG 100UNITS/ML VIAL: 100/ML | 30 days supply | Qty: 10 | Fill #0

## 2016-04-04 MED FILL — ATORVASTATIN 80 MG TABLET: 80 | 30 days supply | Qty: 30 | Fill #0 | Status: TO

## 2016-04-05 ENCOUNTER — Other Ambulatory Visit: Payer: Self-pay | Admitting: Internal Medicine

## 2016-04-05 DIAGNOSIS — Z794 Long term (current) use of insulin: Principal | ICD-10-CM

## 2016-04-05 DIAGNOSIS — E119 Type 2 diabetes mellitus without complications: Secondary | ICD-10-CM

## 2016-04-05 MED ORDER — TRUE METRIX METER W/DEVICE KIT: 1.0000 | PACK | Freq: Three times a day (TID) | Status: DC

## 2016-04-05 MED ORDER — TRUEPLUS LANCETS 28G MISC: 1.0000 | Freq: Three times a day (TID) | Status: DC

## 2016-04-05 MED ORDER — GLUCOSE BLOOD VI STRP: ORAL_STRIP | Status: DC

## 2016-04-05 MED FILL — TRUEplus LANCETS 28G MISC: 30 days supply | Qty: 100 | Fill #0

## 2016-04-05 MED FILL — TRUE METRIX TEST STRIP: 30 days supply | Qty: 100 | Fill #0 | Status: TO

## 2016-04-05 MED FILL — !TRUE METRIX BLOOD GLUCOSE: 1 days supply | Qty: 1 | Fill #0

## 2016-04-13 ENCOUNTER — Encounter: Payer: Self-pay | Admitting: Interventional Cardiology

## 2016-04-13 ENCOUNTER — Ambulatory Visit (INDEPENDENT_AMBULATORY_CARE_PROVIDER_SITE_OTHER): Payer: Medicaid Other | Admitting: Interventional Cardiology

## 2016-04-13 VITALS — BP 120/70 | HR 62 | Ht 66.0 in | Wt 156.8 lb

## 2016-04-13 DIAGNOSIS — I25119 Atherosclerotic heart disease of native coronary artery with unspecified angina pectoris: Secondary | ICD-10-CM

## 2016-04-13 DIAGNOSIS — I10 Essential (primary) hypertension: Secondary | ICD-10-CM

## 2016-04-13 DIAGNOSIS — G458 Other transient cerebral ischemic attacks and related syndromes: Secondary | ICD-10-CM | POA: Diagnosis not present

## 2016-04-13 DIAGNOSIS — I5022 Chronic systolic (congestive) heart failure: Secondary | ICD-10-CM | POA: Diagnosis not present

## 2016-04-13 NOTE — Patient Instructions (Signed)
Your physician recommends that you continue on your current medications as directed. Please refer to the Current Medication list given to you today.  Your physician has requested that you have an echocardiogram. Echocardiography is a painless test that uses sound waves to create images of your heart. It provides your doctor with information about the size and shape of your heart and how well your heart's chambers and valves are working. This procedure takes approximately one hour. There are no restrictions for this procedure.  Please schedule for June.  You have been referred to Electrophysiology to eval for defibrillator.  Please schedule for July, 2017.  Dr. Katrinka Blazing would like you to return at the end of June or early July.  We will call you with appointment details.
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
## 2016-04-17 ENCOUNTER — Ambulatory Visit (HOSPITAL_COMMUNITY): Admission: RE | Admit: 2016-04-17 | Payer: MEDICAID | Source: Ambulatory Visit

## 2016-04-18 ENCOUNTER — Encounter: Payer: Self-pay | Admitting: Gastroenterology

## 2016-04-18 ENCOUNTER — Telehealth: Payer: Self-pay

## 2016-04-18 NOTE — Telephone Encounter (Signed)
Dr Marina Goodell,      Would you like this pt to be a direct [facility] pt or an OV first?  EF too low and on PLAVIX.  Please advise.  Thank you, Juelz Whittenberg/PV

## 2016-04-18 NOTE — Telephone Encounter (Signed)
This patient needs an office visit with me or an AP

## 2016-04-18 NOTE — Telephone Encounter (Signed)
Pt aware of f/u appt scheduled with Dr.Smith on 06/06/16 @ 10am

## 2016-05-03 MED FILL — LISINOPRIL 2.5 MG TABLET: 2.5 | 30 days supply | Qty: 30 | Fill #1

## 2016-05-03 MED FILL — ?PANTOPRAZOLE SOD DR 40MG: 40 MG | 30 days supply | Qty: 30 | Fill #1 | Status: TO

## 2016-05-07 ENCOUNTER — Ambulatory Visit (HOSPITAL_COMMUNITY): Payer: Medicaid Other | Attending: Cardiology

## 2016-05-07 ENCOUNTER — Encounter: Payer: Self-pay | Admitting: Internal Medicine

## 2016-05-07 ENCOUNTER — Other Ambulatory Visit: Payer: Self-pay

## 2016-05-07 DIAGNOSIS — I25119 Atherosclerotic heart disease of native coronary artery with unspecified angina pectoris: Secondary | ICD-10-CM

## 2016-05-07 DIAGNOSIS — I10 Essential (primary) hypertension: Secondary | ICD-10-CM

## 2016-05-07 DIAGNOSIS — I110 Hypertensive heart disease with heart failure: Secondary | ICD-10-CM | POA: Insufficient documentation

## 2016-05-07 DIAGNOSIS — I340 Nonrheumatic mitral (valve) insufficiency: Secondary | ICD-10-CM | POA: Insufficient documentation

## 2016-05-07 DIAGNOSIS — I5022 Chronic systolic (congestive) heart failure: Secondary | ICD-10-CM

## 2016-05-07 DIAGNOSIS — E785 Hyperlipidemia, unspecified: Secondary | ICD-10-CM | POA: Diagnosis not present

## 2016-05-07 DIAGNOSIS — Q240 Dextrocardia: Secondary | ICD-10-CM | POA: Diagnosis not present

## 2016-05-07 DIAGNOSIS — G458 Other transient cerebral ischemic attacks and related syndromes: Secondary | ICD-10-CM

## 2016-05-07 LAB — ECHOCARDIOGRAM COMPLETE
CHL CUP DOP CALC LVOT VTI: 10.5 cm
CHL CUP MV DEC (S): 141
E decel time: 141 msec
E/e' ratio: 14.21
FS: 5 % — AB (ref 28–44)
IV/PV OW: 1.04
LA ID, A-P, ES: 51 cm
LA diam end sys: 51 cm
LA vol: 45.7 cm3
LADIAMINDEX: 2.83 cm/m2
LAVOLA4C: 38.6 mL
LAVOLIN: 25.4 mL/m2
LDCA: 3.8 cm2
LV PW d: 9.28 mm — AB (ref 0.6–1.1)
LVEEAVG: 14.21
LVEEMED: 14.21
LVELAT: 4.87 cm/s
LVOT SV: 40 cm3
LVOT diameter: 22 cm
LVOT peak vel: 61.1 m/s
MVPKAVEL: 81.6 m/s
MVPKEVEL: 69.2 m/s
TAPSE: 9.22 cm
TDI e' lateral: 4.87
TDI e' medial: 3.06

## 2016-05-07 MED ORDER — PERFLUTREN LIPID MICROSPHERE
1.0000 mL | INTRAVENOUS | Status: AC | PRN
  Administered 2016-05-07: 3 mL via INTRAVENOUS

## 2016-05-08 ENCOUNTER — Telehealth: Payer: Self-pay | Admitting: Interventional Cardiology

## 2016-05-08 DIAGNOSIS — I5022 Chronic systolic (congestive) heart failure: Secondary | ICD-10-CM

## 2016-05-08 MED ORDER — LISINOPRIL 2.5 MG PO TABS: 2.5000 mg | ORAL_TABLET | Freq: Two times a day (BID) | ORAL | Status: DC

## 2016-05-08 NOTE — Telephone Encounter (Signed)
Pt aware of echo results and Dr.Smith's recommendation. Let the patient know heart has improved only slightly to EF 20% from 10% when we first met. Plan to continue same meds. Increase Lisinopril to 2.5 mg BID. BMET 10 days A copy will be sent to Maren Reamer, MD  Rx sent to pt pharmacy for Lisinopril 2.74m bid Lab appt scheduled for 6/15 Echo results fwd to pt's pcp Pt verbalized understanding.

## 2016-05-08 NOTE — Telephone Encounter (Signed)
-----  Message from Belva Crome, MD sent at 05/07/2016  7:41 PM EDT ----- Let the patient know heart has improved only slightly to EF 20% from 10% when we first met. Plan to continue same meds. Increase Lisinopril to 2.5 mg BID. BMET 10 days A copy will be sent to Maren Reamer, MD

## 2016-05-08 NOTE — Telephone Encounter (Signed)
New Message ° °Pt returned the call.  °

## 2016-05-17 ENCOUNTER — Other Ambulatory Visit (INDEPENDENT_AMBULATORY_CARE_PROVIDER_SITE_OTHER): Payer: Medicaid Other | Admitting: *Deleted

## 2016-05-17 DIAGNOSIS — I5022 Chronic systolic (congestive) heart failure: Secondary | ICD-10-CM | POA: Diagnosis not present

## 2016-05-17 LAB — BASIC METABOLIC PANEL
BUN: 27 mg/dL — ABNORMAL HIGH (ref 7–25)
CO2: 29 mmol/L (ref 20–31)
Calcium: 9.1 mg/dL (ref 8.6–10.3)
Chloride: 95 mmol/L — ABNORMAL LOW (ref 98–110)
Creat: 1.03 mg/dL (ref 0.70–1.33)
GLUCOSE: 244 mg/dL — AB (ref 65–99)
POTASSIUM: 3.8 mmol/L (ref 3.5–5.3)
SODIUM: 132 mmol/L — AB (ref 135–146)

## 2016-05-17 LAB — CBC WITH DIFFERENTIAL/PLATELET
BASOS PCT: 0 %
Basophils Absolute: 0 cells/uL (ref 0–200)
Eosinophils Absolute: 515 cells/uL — ABNORMAL HIGH (ref 15–500)
Eosinophils Relative: 5 %
HEMATOCRIT: 37.3 % — AB (ref 38.5–50.0)
HEMOGLOBIN: 12.3 g/dL — AB (ref 13.2–17.1)
LYMPHS ABS: 3193 {cells}/uL (ref 850–3900)
Lymphocytes Relative: 31 %
MCH: 26.8 pg — ABNORMAL LOW (ref 27.0–33.0)
MCHC: 33 g/dL (ref 32.0–36.0)
MCV: 81.3 fL (ref 80.0–100.0)
MONO ABS: 927 {cells}/uL (ref 200–950)
MPV: 9.9 fL (ref 7.5–12.5)
Monocytes Relative: 9 %
NEUTROS PCT: 55 %
Neutro Abs: 5665 cells/uL (ref 1500–7800)
Platelets: 227 10*3/uL (ref 140–400)
RBC: 4.59 MIL/uL (ref 4.20–5.80)
RDW: 16.7 % — ABNORMAL HIGH (ref 11.0–15.0)
WBC: 10.3 10*3/uL (ref 3.8–10.8)

## 2016-05-17 NOTE — Addendum Note (Signed)
Addended by: Tonita Phoenix on: 05/17/2016 08:15 AM   Modules accepted: Orders

## 2016-05-17 NOTE — Addendum Note (Signed)
Addended by: Tonita Phoenix on: 05/17/2016 08:14 AM   Modules accepted: Orders

## 2016-05-17 NOTE — Addendum Note (Signed)
Addended by: BOWDEN, ROBIN K on: 05/17/2016 08:14 AM   Modules accepted: Orders  

## 2016-05-17 NOTE — Addendum Note (Signed)
Addended by: BOWDEN, ROBIN K on: 05/17/2016 08:15 AM   Modules accepted: Orders  

## 2016-05-21 MED FILL — CLOPIDOGREL 75 MG TABLET: 75 | 30 days supply | Qty: 30 | Fill #1 | Status: TO

## 2016-05-25 ENCOUNTER — Other Ambulatory Visit: Payer: Self-pay | Admitting: *Deleted

## 2016-05-25 MED ORDER — BUDESONIDE-FORMOTEROL FUMARATE 80-4.5 MCG/ACT IN AERO: 2.0000 | INHALATION_SPRAY | Freq: Two times a day (BID) | RESPIRATORY_TRACT | Status: DC

## 2016-05-25 MED ORDER — INSULIN GLARGINE 100 UNIT/ML ~~LOC~~ SOLN: 15.0000 [IU] | Freq: Every day | SUBCUTANEOUS | Status: DC

## 2016-05-25 MED ORDER — INSULIN ASPART 100 UNIT/ML ~~LOC~~ SOLN: 5.0000 [IU] | Freq: Three times a day (TID) | SUBCUTANEOUS | Status: DC

## 2016-05-25 NOTE — Telephone Encounter (Signed)
PASS PROGRAM 

## 2016-05-29 MED FILL — FUROSEMIDE 40 MG TABLET: 40 | 30 days supply | Qty: 60 | Fill #1 | Status: TO

## 2016-05-29 MED FILL — ATORVASTATIN 80 MG TABLET: 80 | 30 days supply | Qty: 30 | Fill #1 | Status: TO

## 2016-05-29 MED FILL — LISINOPRIL 2.5 MG TABLET: 2.5 | 30 days supply | Qty: 30 | Fill #2

## 2016-06-05 DIAGNOSIS — Q240 Dextrocardia: Secondary | ICD-10-CM | POA: Insufficient documentation
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
## 2016-06-06 ENCOUNTER — Ambulatory Visit (INDEPENDENT_AMBULATORY_CARE_PROVIDER_SITE_OTHER): Payer: Medicaid Other | Admitting: Interventional Cardiology

## 2016-06-06 ENCOUNTER — Encounter: Payer: Self-pay | Admitting: Interventional Cardiology

## 2016-06-06 VITALS — BP 112/70 | HR 70 | Ht 66.0 in | Wt 154.1 lb

## 2016-06-06 DIAGNOSIS — I5022 Chronic systolic (congestive) heart failure: Secondary | ICD-10-CM

## 2016-06-06 DIAGNOSIS — E785 Hyperlipidemia, unspecified: Secondary | ICD-10-CM

## 2016-06-06 DIAGNOSIS — I25810 Atherosclerosis of coronary artery bypass graft(s) without angina pectoris: Secondary | ICD-10-CM

## 2016-06-06 DIAGNOSIS — I63232 Cerebral infarction due to unspecified occlusion or stenosis of left carotid arteries: Secondary | ICD-10-CM

## 2016-06-06 DIAGNOSIS — Q240 Dextrocardia: Secondary | ICD-10-CM

## 2016-06-06 MED ORDER — ASPIRIN 81 MG PO TABS: 81.0000 mg | ORAL_TABLET | Freq: Every day | ORAL | Status: AC

## 2016-06-06 NOTE — Patient Instructions (Addendum)
Medication Instructions:  Your physician has recommended you make the following change in your medication:  REDUCE Aspirin to 81mg  daily  Labwork: None ordered  Testing/Procedures: None ordered  Follow-Up: Your physician wants you to follow-up in: 3-4 months with Dr.Smith You will receive a reminder letter in the mail two months in advance. If you don't receive a letter, please call our office to schedule the follow-up appointment.   Any Other Special Instructions Will Be Listed Below (If Applicable).     If you need a refill on your cardiac medications before your next appointment, please call your pharmacy.

## 2016-06-11 MED FILL — CARVEDILOL 6.25 MG TABLET: 6.25 | 30 days supply | Qty: 60 | Fill #1 | Status: TO
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
## 2016-06-13 ENCOUNTER — Encounter: Payer: Self-pay | Admitting: Cardiology

## 2016-06-13 ENCOUNTER — Ambulatory Visit (INDEPENDENT_AMBULATORY_CARE_PROVIDER_SITE_OTHER): Payer: Medicaid Other | Admitting: Neurology

## 2016-06-13 ENCOUNTER — Ambulatory Visit (INDEPENDENT_AMBULATORY_CARE_PROVIDER_SITE_OTHER): Payer: Medicaid Other | Admitting: Cardiology

## 2016-06-13 ENCOUNTER — Encounter: Payer: Self-pay | Admitting: Neurology

## 2016-06-13 VITALS — BP 115/65 | HR 75 | Ht 66.0 in | Wt 156.0 lb

## 2016-06-13 VITALS — BP 110/60 | HR 76 | Ht 66.0 in | Wt 155.0 lb

## 2016-06-13 DIAGNOSIS — I25709 Atherosclerosis of coronary artery bypass graft(s), unspecified, with unspecified angina pectoris: Secondary | ICD-10-CM

## 2016-06-13 DIAGNOSIS — I63232 Cerebral infarction due to unspecified occlusion or stenosis of left carotid arteries: Secondary | ICD-10-CM

## 2016-06-13 DIAGNOSIS — E1159 Type 2 diabetes mellitus with other circulatory complications: Secondary | ICD-10-CM | POA: Diagnosis not present

## 2016-06-13 DIAGNOSIS — E785 Hyperlipidemia, unspecified: Secondary | ICD-10-CM

## 2016-06-13 DIAGNOSIS — I6521 Occlusion and stenosis of right carotid artery: Secondary | ICD-10-CM | POA: Diagnosis not present

## 2016-06-13 DIAGNOSIS — Q240 Dextrocardia: Secondary | ICD-10-CM

## 2016-06-13 DIAGNOSIS — I255 Ischemic cardiomyopathy: Secondary | ICD-10-CM

## 2016-06-13 NOTE — Progress Notes (Signed)
STROKE NEUROLOGY FOLLOW UP NOTE  NAME: Travis Tran DOB: 1956-10-13  REASON FOR VISIT: stroke follow up HISTORY FROM: no one  Today we had the pleasure of seeing Travis Tran in follow-up at our Neurology [facility]. Pt was accompanied by pt and chart.   History Summary Travis Tran is a 60 y.o. male with history of situs inversus, DM, HTN, CAD, cardiomyopathy, HLD, s/p CABG x5, and a recent diagnosis of CHF w/known EF 10% was admitted on 03/20/16 for motor aphasia with garbled speech and R sided weakness and numbness. Symptoms resolved. MRI showed no acute infarct. CTA head and neck showed left ICA occlusion with some reconstitution at terminus. Right ICA origin 65%. Cerebral angio showed left ICA occlusion with right ICA 80-85% stenosis at petrous portion. CUS right ICA 60-79% stenosis. EF 15%, LDL 120 and A1C 15.1. His left brain TIA was considered due to left ICA occlusion and right ICA high grade stenosis in the setting of low BP (SBP 90-100s). He received right ICA intracranial stent. His BP still low at 100s post procedure due to multiple BP meds for his heart failure. Recommended BP goal 120-140 and compression socks. He was discharged on ASA and plavix and high dose lipitor.   Interval History During the interval time, the patient has been doing well. No stroke like symptoms. He follows with cardiology Dr. Katrinka Blazing and repeat TTE in 05/2016 showed EF 20%. He was recently referred to Dr. Elberta Fortis for ICD placement. Pt is yet to make up his decision. BP today 115/65. He is on coreg, lasix, spironolactone, and lisinopril. He feels some lightheadedness soon after his BP medication in the morning but not severe. He is continued on ASA and plavix and lipitor    REVIEW OF SYSTEMS: Full 14 system review of systems performed and notable only for those listed below and in HPI above, all others are negative:  Constitutional:   Cardiovascular:  Ear/Nose/Throat:  Light sensitivity Skin:  Eyes:     Respiratory:   Gastroitestinal:   Genitourinary:  Hematology/Lymphatic:   Endocrine:  Musculoskeletal:   Allergy/Immunology:   Neurological:  dizziness Psychiatric:  Sleep:   The following represents the patient's updated allergies and side effects list: Allergies  Allergen Reactions  . Celecoxib Swelling  . Sulfa Antibiotics Other (See Comments)    unspecified    The neurologically relevant items on the patient's problem list were reviewed on today's visit.  Neurologic Examination  A problem focused neurological exam (12 or more points of the single system neurologic examination, vital signs counts as 1 point, cranial nerves count for 8 points) was performed.  Blood pressure 115/65, pulse 75, height  (1.676 m), weight 156 lb (70.761 kg).  General - Well nourished, well developed, in no apparent distress.  Ophthalmologic - Sharp disc margins OU.   Cardiovascular - Regular rate and rhythm with no murmur.  Mental Status -  Level of arousal and orientation to time, place, and person were intact. Language including expression, naming, repetition, comprehension was assessed and found intact. Fund of Knowledge was assessed and was intact.  Cranial Nerves II - XII - II - Visual field intact OU. III, IV, VI - Extraocular movements intact. V - Facial sensation intact bilaterally. VII - Facial movement intact bilaterally. VIII - Hearing & vestibular intact bilaterally. X - Palate elevates symmetrically. XI - Chin turning & shoulder shrug intact bilaterally. XII - Tongue protrusion intact.  Motor Strength - The patient's strength was normal in all extremities  and pronator drift was absent.  Bulk was normal and fasciculations were absent.   Motor Tone - Muscle tone was assessed at the neck and appendages and was normal.  Reflexes - The patient's reflexes were 1+ in all extremities and he had no pathological reflexes.  Sensory - Light touch, temperature/pinprick,  vibration and proprioception, and Romberg testing were assessed and were normal.    Coordination - The patient had normal movements in the hands and feet with no ataxia or dysmetria.  Tremor was absent.  Gait and Station - The patient's transfers, posture, gait, station, and turns were observed as normal.   Functional score  mRS = 0   0 - No symptoms.   1 - No significant disability. Able to carry out all usual activities, despite some symptoms.   2 - Slight disability. Able to look after own affairs without assistance, but unable to carry out all previous activities.   3 - Moderate disability. Requires some help, but able to walk unassisted.   4 - Moderately severe disability. Unable to attend to own bodily needs without assistance, and unable to walk unassisted.   5 - Severe disability. Requires constant nursing care and attention, bedridden, incontinent.   6 - Dead.   NIH Stroke Scale = 0   Data reviewed: I personally reviewed the images and agree with the radiology interpretations.  Ct Head Wo Contrast 03/20/2016 No acute intracranial abnormalities. Mild RIGHT maxillary sinus disease.   Ct Angio Head & Neck W/cm &/or Wo/cm 03/20/2016 1. 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 2. 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 Reconstituted flow at the left ICA terminus such that the left MCA and ACA origins remain normal. 3. Very advanced for age atherosclerosis in the head and neck. Additional atherosclerotic moderate or severe stenosis: - Right ICA origin (65%), - Right ICA siphon, - Left Vertebral artery V4 segment, - Right PCA P2 segment (moderate) - Left ACA callosomarginal artery origin. 4. No cortically based acute infarct identified.   Mr Brain Wo Contrast 03/20/2016 No acute infarct or intracranial hemorrhage.  Abnormal appearance of the left internal carotid artery. Please see CT angiogram report. Mild chronic microvascular changes most notable left parietal lobe. Right parietal region small arachnoid cyst may be present as noted above. Moderate mucosal thickening right maxillary sinus.   Cerebral angio 1.Occluded LT ICA at the cavernous seg at the opthalmic artery level. 2.Approx 80 % to 85% stenosis of RT ICA petrous -cavernous junction. 3.Approx 75 to 80% stenosis of LT VBJ distal to LT PICA origin.  TTE 03/21/16 - Left ventricle: The cavity size was normal. Systolic function was  normal. The estimated ejection fraction was 15%. Severe diffuse  hypokinesis with regional variations. There is akinesis of the  entireapical myocardium. There is akinesis of the  mid-apicallateral myocardium. - Atrial septum: No defect or patent foramen ovale was identified. - Impressions: Limited study. Severe LVE. EF 15% Diffuse  hypokinesis  RV normal size  Bubble study negative for right to left shunt. Impressions: - Limited study. Severe LVE. EF 15% Diffuse hypokinesis  RV normal size  Bubble study negative for right to left shunt.  TTE 05/07/2016 - Procedure narrative: Transthoracic echocardiography. Image  quality was adequate. Intravenous contrast (Definity) was  administered. - Status, risk factors: Dextrocardia. - Left ventricle: The cavity size was normal. Wall thickness was  normal. The estimated ejection fraction was 20%. Diffuse  hypokinesis. No LV thrombus. Doppler parameters are consistent  with abnormal left ventricular relaxation (grade 1 diastolic  dysfunction). - Aortic valve: There was no stenosis. - Mitral valve: There was trivial regurgitation. - Left atrium: The atrium was mildly dilated. - Right ventricle: The cavity size was normal. Systolic function  was moderately reduced. - Right atrium: The atrium was mildly dilated. - Pulmonary arteries: No complete TR doppler  jet so unable to  estimate PA systolic pressure. - Inferior vena cava: The vessel was normal in size. The  respirophasic diameter changes were in the normal range (= 50%),  consistent with normal central venous pressure. Impressions: - Dextrocardia. Normal LV size with EF 20%, diffuse hypokinesis.  Normal RV size with moderately decreased systolic function. No  significant valvular abnormalities.  CUS - - Right - 60% to 79% ICA stenosis at the bifurcation/ proximal ICA.  Moderate heterogeneous plaque. - Right - - Left - Moderate heterogeneous plaque noted in the bifurcation  with diminished velocities throughout the ICA with a abnormal  damped waveform and loss of diastolic flow suggestive of a  probable more distal obstruction. - Bilateral - Vertebral artery flow is antegrade.  Component     Latest Ref Rng 02/27/2016 02/28/2016 03/23/2016 03/24/2016  Cholesterol     0 - 200 mg/dL  161    Triglycerides     <150 mg/dL  096    HDL Cholesterol     >40 mg/dL  49    Total CHOL/HDL Ratio       3.9    VLDL     0 - 40 mg/dL  22    LDL (calc)     0 - 99 mg/dL  045 (H)    Hemoglobin A1C     4.8 - 5.6 % 15.1 (H)     Mean Plasma Glucose      387     TSH     0.350 - 4.500 uIU/mL 1.446     HIV     Non Reactive Non Reactive     Platelet Function  P2Y12     194 - 418 PRU   113 (L) 85 (L)    Assessment: As you may recall, he is a 60 y.o. Caucasian male with PMH of situs inversus, DM, HTN, CAD, HLD, s/p CABG x5, and CHF w/known EF 10% was admitted on 03/20/16 for transient motor aphasia and R sided weakness and numbness. MRI showed no acute infarct. CTA head and neck showed left ICA occlusion. Right ICA origin 65%. Cerebral angio showed left ICA occlusion with right ICA 80-85% stenosis at petrous portion. CUS right ICA 60-79% stenosis. EF 15%, LDL 120 and A1C 15.1. His left brain TIA was considered due to left ICA occlusion and right ICA high grade stenosis in the setting of low BP  (SBP 90-100s). He received right ICA intracranial stent. His BP still low at 100s post procedure due to multiple BP meds for his heart failure. Recommended BP goal 120-140 and compression socks. He was discharged on ASA and plavix and high dose lipitor. During the interval time, the patient has been doing well. Repeat TTE in 05/2016 showed EF 20%. He is pending ICD placement. BP today 115/65. He is on coreg, lasix, spironolactone, and lisinopril.    Plan:  - continue ASA and plavix and lipitor for stroke prevention - follow up with cardiology for CHF. - will do CTA head and neck to evaluate  patency of the stent - BP control is the key for stroke prevention, BP goal 120-140. Due to necessity of BP meds for CHF, recommend compression socks.  - Follow up with your primary care physician for stroke risk factor modification. Recommend maintain blood pressure goal <130/80, diabetes with hemoglobin A1c goal below 7.0% and lipids with LDL cholesterol goal below 70 mg/dL.  - OK with ICD placement procedure. Avoid low BP during and after the procedure.  - follow up in 3 months.   I spent more than 25 minutes of face to face time with the patient. Greater than 50% of time was spent in counseling and coordination of care. We discussed BP management, treating low EF, ICD placement and CTA head and neck.    Orders Placed This Encounter  Procedures  . CT ANGIO NECK W OR WO CONTRAST    Standing Status: Future     Number of Occurrences:      Standing Expiration Date: 08/14/2017    Order Specific Question:  If indicated for the ordered procedure, I authorize the administration of contrast media per Radiology protocol    Answer:  Yes    Order Specific Question:  Reason for Exam (SYMPTOM  OR DIAGNOSIS REQUIRED)    Answer:  ICA stenosis    Order Specific Question:  Preferred imaging location?    Answer:  Internal  . CT ANGIO HEAD W OR WO CONTRAST    Standing Status: Future     Number of Occurrences:       Standing Expiration Date: 08/14/2017    Order Specific Question:  If indicated for the ordered procedure, I authorize the administration of contrast media per Radiology protocol    Answer:  Yes    Order Specific Question:  Reason for Exam (SYMPTOM  OR DIAGNOSIS REQUIRED)    Answer:  ICA stenosis    Order Specific Question:  Preferred imaging location?    Answer:  Internal    No orders of the defined types were placed in this encounter.    Patient Instructions  - continue ASA and plavix and lipitor for stroke prevention - follow up with cardiology for heart failure. - will do CTA head and neck to evaluate patency of the stent - BP control is the key for stroke prevention, BP goal 120-140. Due to necessity of BP meds for heart failure, we recommend compression sucks from medical supply store.  - if you feel dizzy lightheadedness, sit down or lying flat to help BP. Slow on standing up or sitting up.  - Follow up with your primary care physician for stroke risk factor modification. Recommend maintain blood pressure goal <130/80, diabetes with hemoglobin A1c goal below 7.0% and lipids with LDL cholesterol goal below 70 mg/dL.  - OK with pacemaker placement procedure. Avoid low BP during and after the procedure.  - follow up in 3 months.    Marvel Plan, MD PhD [facility] [address], [address] [place], [place] [address] [phone]

## 2016-06-13 NOTE — Patient Instructions (Signed)
Medication Instructions:  Your physician recommends that you continue on your current medications as directed. Please refer to the Current Medication list given to you today.  Labwork: None ordered  Testing/Procedures: Your physician has recommended that you have a defibrillator inserted. An implantable cardioverter defibrillator (ICD) is a small device that is placed in your chest or, in rare cases, your abdomen. This device uses electrical pulses or shocks to help control life-threatening, irregular heartbeats that could lead the heart to suddenly stop beating (sudden cardiac arrest). Leads are attached to the ICD that goes into your heart. This is done in the hospital and usually requires an overnight stay.   Please call the office if/when you are ready to schedule this procedure.  Follow-Up: To be determined if you decide to have ICD implanted.  If you need a refill on your cardiac medications before your next appointment, please call your pharmacy.  Thank you for choosing [facility]!!   Dory Horn, RN [phone]  Any Other Special Instructions Will Be Listed Below (If Applicable).  Cardioverter Defibrillator Implantation An implantable cardioverter defibrillator (ICD) is a small, lightweight, battery-powered device that is placed (implanted) under the skin in the chest or abdomen. Your caregiver may prescribe an ICD if:  You have had an irregular heart rhythm (arrhythmia) that originated in the lower chambers of the heart (ventricles).  Your heart has been damaged by a disease (such as coronary artery disease) or heart condition (such as a heart attack). An ICD consists of a battery that lasts several years, a small computer called a pulse generator, and wires called leads that go into the heart. It is used to detect and correct two dangerous arrhythmias: a rapid heart rhythm (tachycardia) and an arrhythmia in which the ventricles contract in an uncoordinated way  (fibrillation). When an ICD detects tachycardia, it sends an electrical signal to the heart that restores the heartbeat to normal (cardioversion). This signal is usually painless. If cardioversion does not work or if the ICD detects fibrillation, it delivers a small electrical shock to the heart (defibrillation) to restart the heart. The shock may feel like a strong jolt in the chest.ICDs may be programmed to correct other problems. Sometimes, ICDs are programmed to act as another type of implantable device called a pacemaker. Pacemakers are used to treat a slow heartbeat (bradycardia). LET YOUR CAREGIVER KNOW ABOUT:  Any allergies you have.  All medicines you are taking, including vitamins, herbs, eyedrops, and over-the-counter medicines and creams.  Previous problems you or members of your family have had with the use of anesthetics.  Any blood disorders you have had.  Other health problems you have. RISKS AND COMPLICATIONS Generally, the procedure to implant an ICD is safe. However, as with any surgical procedure, complications can occur. Possible complications associated with implanting an ICD include:  Swelling, bleeding, or bruising at the site where the ICD was implanted.  Infection at the site where the ICD was implanted.  A reaction to medicine used during the procedure.  Nerve, heart, or blood vessel damage.  Blood clots. BEFORE THE PROCEDURE  You may need to have blood tests, heart tests, or a chest X-ray done before the day of the procedure.  Ask your caregiver about changing or stopping your regular medicines.  Make plans to have someone drive you home. You may need to stay in the hospital overnight after the procedure.  Stop smoking at least 24 hours before the procedure.  Take a bath or shower the  night before the procedure. You may need to scrub your chest or abdomen with a special type of soap.  Do not eat or drink before your procedure for as long as directed by  your caregiver. Ask if it is okay to take any needed medicine with a small sip of water. PROCEDURE  The procedure to implant an ICD in your chest or abdomen is usually done at a hospital in a room that has a large X-ray machine called a fluoroscope. The machine will be above you during the procedure. It will help your caregiver see your heart during the procedure. Implanting an ICD usually takes 1-3 hours. Before the procedure:   Small monitors will be put on your body. They will be used to check your heart, blood pressure, and oxygen level.  A needle will be put into a vein in your hand or arm. This is called an intravenous (IV) access tube. Fluids and medicine will flow directly into your body through the IV tube.  Your chest or abdomen will be cleaned with a germ-killing (antiseptic) solution. The area may be shaved.  You may be given medicine to help you relax (sedative).  You will be given a medicine called a local anesthetic. This medicine will make the surgical site numb while the ICD is implanted. You will be sleepy but awake during the procedure. After you are numb the procedure will begin. The caregiver will:  Make a small cut (incision). This will make a pocket deep under your skin that will hold the pulse generator.  Guide the leads through a large blood vessel into your heart and attach them to the heart muscles. Depending on the ICD, the leads may go into one ventricle or they may go to both ventricles and into an upper chamber of the heart (atrium).  Test the ICD.  Close the incision with stitches, glue, or staples. AFTER THE PROCEDURE  You may feel pain. Some pain is normal. It may last a few days.  You may stay in a recovery area until the local anesthetic has worn off. Your blood pressure and pulse will be checked often. You will be taken to a room where your heart will be monitored.  A chest X-ray will be taken. This is done to check that the cardioverter defibrillator  is in the right place.  You may stay in the hospital overnight.  A slight bump may be seen over the skin where the ICD was placed. Sometimes, it is possible to feel the ICD under the skin. This is normal.  In the months and years afterward, your caregiver will check the device, the leads, and the battery every few months. Eventually, when the battery is low, the ICD will be replaced.   This information is not intended to replace advice given to you by your health care provider. Make sure you discuss any questions you have with your health care provider.   Document Released: 08/11/2002 Document Revised: 09/09/2013 Document Reviewed: 12/08/2012 Elsevier Interactive Patient Education [employer]

## 2016-06-13 NOTE — Patient Instructions (Signed)
-   continue ASA and plavix and lipitor for stroke prevention - follow up with cardiology for heart failure. - will do CTA head and neck to evaluate patency of the stent - BP control is the key for stroke prevention, BP goal 120-140. Due to necessity of BP meds for heart failure, we recommend compression sucks from medical supply store.  - if you feel dizzy lightheadedness, sit down or lying flat to help BP. Slow on standing up or sitting up.  - Follow up with your primary care physician for stroke risk factor modification. Recommend maintain blood pressure goal <130/80, diabetes with hemoglobin A1c goal below 7.0% and lipids with LDL cholesterol goal below 70 mg/dL.  - OK with pacemaker placement procedure. Avoid low BP during and after the procedure.  - follow up in 3 months.

## 2016-06-14 MED FILL — PANTOPRAZOLE SOD DR 40 MG T: 40 | 30 days supply | Qty: 30 | Fill #2 | Status: TO

## 2016-06-18 MED FILL — ?SPIRONOLACTONE 25 MG TABLE: 25 | 30 days supply | Qty: 15 | Fill #1 | Status: TO

## 2016-06-20 ENCOUNTER — Encounter: Payer: Self-pay | Admitting: Gastroenterology

## 2016-06-20 ENCOUNTER — Ambulatory Visit (INDEPENDENT_AMBULATORY_CARE_PROVIDER_SITE_OTHER): Payer: Medicaid Other | Admitting: Gastroenterology

## 2016-06-20 ENCOUNTER — Other Ambulatory Visit (INDEPENDENT_AMBULATORY_CARE_PROVIDER_SITE_OTHER): Payer: Medicaid Other

## 2016-06-20 VITALS — BP 120/52 | HR 56 | Ht 66.0 in | Wt 153.0 lb

## 2016-06-20 DIAGNOSIS — Z7902 Long term (current) use of antithrombotics/antiplatelets: Secondary | ICD-10-CM | POA: Diagnosis not present

## 2016-06-20 DIAGNOSIS — D508 Other iron deficiency anemias: Secondary | ICD-10-CM

## 2016-06-20 DIAGNOSIS — Z1211 Encounter for screening for malignant neoplasm of colon: Secondary | ICD-10-CM

## 2016-06-20 DIAGNOSIS — D649 Anemia, unspecified: Secondary | ICD-10-CM

## 2016-06-20 LAB — CBC WITH DIFFERENTIAL/PLATELET
BASOS ABS: 0 10*3/uL (ref 0.0–0.1)
Basophils Relative: 0.4 % (ref 0.0–3.0)
EOS ABS: 0.7 10*3/uL (ref 0.0–0.7)
Eosinophils Relative: 6.7 % — ABNORMAL HIGH (ref 0.0–5.0)
HCT: 41.3 % (ref 39.0–52.0)
Hemoglobin: 13.9 g/dL (ref 13.0–17.0)
LYMPHS ABS: 2.4 10*3/uL (ref 0.7–4.0)
LYMPHS PCT: 22.1 % (ref 12.0–46.0)
MCHC: 33.7 g/dL (ref 30.0–36.0)
MCV: 82.4 fl (ref 78.0–100.0)
Monocytes Absolute: 0.7 10*3/uL (ref 0.1–1.0)
Monocytes Relative: 6.8 % (ref 3.0–12.0)
NEUTROS ABS: 7 10*3/uL (ref 1.4–7.7)
NEUTROS PCT: 64 % (ref 43.0–77.0)
Platelets: 234 10*3/uL (ref 150.0–400.0)
RBC: 5.01 Mil/uL (ref 4.22–5.81)
RDW: 14.6 % (ref 11.5–15.5)
WBC: 10.9 10*3/uL — ABNORMAL HIGH (ref 4.0–10.5)

## 2016-06-20 LAB — FERRITIN: Ferritin: 119.8 ng/mL (ref 22.0–322.0)

## 2016-06-20 LAB — IBC PANEL
Iron: 75 ug/dL (ref 42–165)
SATURATION RATIOS: 23.6 % (ref 20.0–50.0)
TRANSFERRIN: 227 mg/dL (ref 212.0–360.0)
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
## 2016-06-21 ENCOUNTER — Telehealth: Payer: Self-pay | Admitting: Neurology

## 2016-06-21 ENCOUNTER — Ambulatory Visit
Admission: RE | Admit: 2016-06-21 | Discharge: 2016-06-21 | Disposition: A | Discharge status: Home / Self Care | Payer: Medicaid Other | Source: Ambulatory Visit | Attending: Neurology | Admitting: Neurology

## 2016-06-21 ENCOUNTER — Telehealth: Payer: Self-pay | Admitting: Cardiology

## 2016-06-21 DIAGNOSIS — Z79899 Other long term (current) drug therapy: Secondary | ICD-10-CM

## 2016-06-21 DIAGNOSIS — I63232 Cerebral infarction due to unspecified occlusion or stenosis of left carotid arteries: Secondary | ICD-10-CM

## 2016-06-21 DIAGNOSIS — I255 Ischemic cardiomyopathy: Secondary | ICD-10-CM

## 2016-06-21 MED ORDER — IOPAMIDOL (ISOVUE-370) INJECTION 76%
100.0000 mL | Freq: Once | INTRAVENOUS | Status: AC | PRN
  Administered 2016-06-21: 100 mL via INTRAVENOUS

## 2016-06-21 NOTE — Telephone Encounter (Signed)
New message  Pt call requesting to speak with RN about schedule surgical appt to insert defib. Please call back to advise

## 2016-06-21 NOTE — Telephone Encounter (Signed)
Message sent to Dr. Roda Shutters that patient already has cardiologist MD to schedule his defibrillator.

## 2016-06-21 NOTE — Telephone Encounter (Signed)
Patient would like to scheduled ICD for 8/8. Advised I would call him tomorrow afternoon/next week to review instructions. Patient verbalized understanding and agreeable to plan.

## 2016-06-21 NOTE — Telephone Encounter (Signed)
Patient called to advise, after talking to Dr. Roda Shutters at visit July 12th about defibrillator placement, he wishes to proceed with scheduling.

## 2016-06-21 NOTE — Telephone Encounter (Signed)
Rn call patient back that his cardiologist would schedule the defibrillator. Pt stated she call his cardiac md and they are working on the schedule.

## 2016-06-27 ENCOUNTER — Encounter: Payer: Self-pay | Admitting: Interventional Cardiology

## 2016-06-27 ENCOUNTER — Telehealth: Payer: Self-pay | Admitting: *Deleted

## 2016-06-27 NOTE — Telephone Encounter (Signed)
Per Dr Roda Shutters, spoke with patient and informed him his CT angio head and neck test done recently showed his stent at right side brain vessel is patent. The other vessels are the same as before without change or worsening. Dr Roda Shutters advised he please continue current treatment and medications. Patient verbalized understanding, appreciation.

## 2016-06-28 NOTE — Telephone Encounter (Signed)
Patient would like me to call him next Tuesday, 8/1, to review instructions and schedule f/u appts.

## 2016-06-29 ENCOUNTER — Other Ambulatory Visit: Payer: Self-pay | Admitting: *Deleted

## 2016-07-02 ENCOUNTER — Ambulatory Visit: Payer: Self-pay | Admitting: Interventional Cardiology

## 2016-07-03 ENCOUNTER — Encounter: Payer: Self-pay | Admitting: *Deleted

## 2016-07-03 NOTE — Telephone Encounter (Signed)
Letter of instructions reviewed with patient and left at front desk for him to pick up Thursday when he comes in for pre procedure labs. Surgical scrub and instructions also left at front desk for pick up. Wound check scheduled for 8/17. 3 mo post implant f/u scheduled for 11/14.  Patient verbalized understanding and agreeable to plan.

## 2016-07-05 ENCOUNTER — Other Ambulatory Visit: Payer: Medicaid Other | Admitting: *Deleted

## 2016-07-05 DIAGNOSIS — Z79899 Other long term (current) drug therapy: Secondary | ICD-10-CM

## 2016-07-05 DIAGNOSIS — I255 Ischemic cardiomyopathy: Secondary | ICD-10-CM

## 2016-07-05 LAB — CBC WITH DIFFERENTIAL/PLATELET
BASOS PCT: 0 %
Basophils Absolute: 0 cells/uL (ref 0–200)
EOS ABS: 470 {cells}/uL (ref 15–500)
Eosinophils Relative: 5 %
HCT: 40.4 % (ref 38.5–50.0)
Hemoglobin: 13.1 g/dL — ABNORMAL LOW (ref 13.2–17.1)
Lymphocytes Relative: 34 %
Lymphs Abs: 3196 cells/uL (ref 850–3900)
MCH: 27.9 pg (ref 27.0–33.0)
MCHC: 32.4 g/dL (ref 32.0–36.0)
MCV: 86.1 fL (ref 80.0–100.0)
MONO ABS: 752 {cells}/uL (ref 200–950)
MONOS PCT: 8 %
MPV: 9.6 fL (ref 7.5–12.5)
NEUTROS ABS: 4982 {cells}/uL (ref 1500–7800)
Neutrophils Relative %: 53 %
PLATELETS: 233 10*3/uL (ref 140–400)
RBC: 4.69 MIL/uL (ref 4.20–5.80)
RDW: 13.8 % (ref 11.0–15.0)
WBC: 9.4 10*3/uL (ref 3.8–10.8)

## 2016-07-05 LAB — BASIC METABOLIC PANEL
BUN: 25 mg/dL (ref 7–25)
CALCIUM: 8.8 mg/dL (ref 8.6–10.3)
CO2: 30 mmol/L (ref 20–31)
Chloride: 103 mmol/L (ref 98–110)
Creat: 1.01 mg/dL (ref 0.70–1.33)
Glucose, Bld: 258 mg/dL — ABNORMAL HIGH (ref 65–99)
POTASSIUM: 3.9 mmol/L (ref 3.5–5.3)
SODIUM: 139 mmol/L (ref 135–146)

## 2016-07-10 ENCOUNTER — Encounter (HOSPITAL_COMMUNITY): Payer: Self-pay | Admitting: Cardiology

## 2016-07-10 ENCOUNTER — Ambulatory Visit (HOSPITAL_COMMUNITY)
Admission: RE | Admit: 2016-07-10 | Discharge: 2016-07-11 | Disposition: A | Discharge status: Home / Self Care | Payer: Medicaid Other | Source: Ambulatory Visit | Attending: Cardiology | Admitting: Cardiology

## 2016-07-10 ENCOUNTER — Encounter (HOSPITAL_COMMUNITY)
Admission: RE | Disposition: A | Discharge status: Home / Self Care | Payer: Self-pay | Source: Ambulatory Visit | Attending: Cardiology

## 2016-07-10 DIAGNOSIS — Q240 Dextrocardia: Secondary | ICD-10-CM | POA: Insufficient documentation

## 2016-07-10 DIAGNOSIS — I5022 Chronic systolic (congestive) heart failure: Secondary | ICD-10-CM | POA: Diagnosis not present

## 2016-07-10 DIAGNOSIS — I2510 Atherosclerotic heart disease of native coronary artery without angina pectoris: Secondary | ICD-10-CM | POA: Diagnosis not present

## 2016-07-10 DIAGNOSIS — Z95818 Presence of other cardiac implants and grafts: Secondary | ICD-10-CM

## 2016-07-10 DIAGNOSIS — Z882 Allergy status to sulfonamides status: Secondary | ICD-10-CM | POA: Diagnosis not present

## 2016-07-10 DIAGNOSIS — I509 Heart failure, unspecified: Secondary | ICD-10-CM

## 2016-07-10 DIAGNOSIS — I739 Peripheral vascular disease, unspecified: Secondary | ICD-10-CM | POA: Insufficient documentation

## 2016-07-10 DIAGNOSIS — I255 Ischemic cardiomyopathy: Secondary | ICD-10-CM | POA: Diagnosis present

## 2016-07-10 HISTORY — PX: EP IMPLANTABLE DEVICE: SHX172B

## 2016-07-10 LAB — GLUCOSE, CAPILLARY
Glucose-Capillary: 308 mg/dL — ABNORMAL HIGH (ref 65–99)
Glucose-Capillary: 221 mg/dL — ABNORMAL HIGH (ref 65–99)
Glucose-Capillary: 122 mg/dL — ABNORMAL HIGH (ref 65–99)
Glucose-Capillary: 239 mg/dL — ABNORMAL HIGH (ref 65–99)

## 2016-07-10 LAB — SURGICAL PCR SCREEN
MRSA, PCR: POSITIVE — AB
Staphylococcus aureus: POSITIVE — AB

## 2016-07-10 SURGERY — ICD IMPLANT
Anesthesia: LOCAL

## 2016-07-10 MED ORDER — SPIRONOLACTONE 25 MG PO TABS
12.5000 mg | ORAL_TABLET | Freq: Every day | ORAL | Status: DC
  Administered 2016-07-10 – 2016-07-11 (×2): 12.5 mg via ORAL
  Filled 2016-07-10 (×2): qty 1

## 2016-07-10 MED ORDER — ATORVASTATIN CALCIUM 80 MG PO TABS
80.0000 mg | ORAL_TABLET | Freq: Every day | ORAL | Status: DC
  Administered 2016-07-10: 80 mg via ORAL
  Filled 2016-07-10: qty 1

## 2016-07-10 MED ORDER — INSULIN ASPART 100 UNIT/ML ~~LOC~~ SOLN
0.0000 [IU] | Freq: Three times a day (TID) | SUBCUTANEOUS | Status: DC
  Administered 2016-07-10: 5 [IU] via SUBCUTANEOUS
  Administered 2016-07-10: 11 [IU] via SUBCUTANEOUS
  Administered 2016-07-10: 2 [IU] via SUBCUTANEOUS
  Administered 2016-07-11: 3 [IU] via SUBCUTANEOUS
  Filled 2016-07-10: qty 0.15

## 2016-07-10 MED ORDER — ONDANSETRON HCL 4 MG/2ML IJ SOLN: 4.0000 mg | Freq: Four times a day (QID) | INTRAMUSCULAR | Status: DC | PRN

## 2016-07-10 MED ORDER — INSULIN GLARGINE 100 UNIT/ML ~~LOC~~ SOLN
10.0000 [IU] | Freq: Every day | SUBCUTANEOUS | Status: DC
  Administered 2016-07-10: 10 [IU] via SUBCUTANEOUS
  Filled 2016-07-10 (×2): qty 0.1

## 2016-07-10 MED ORDER — CHLORHEXIDINE GLUCONATE CLOTH 2 % EX PADS
6.0000 | MEDICATED_PAD | Freq: Every day | CUTANEOUS | Status: DC
  Administered 2016-07-11: 6 via TOPICAL

## 2016-07-10 MED ORDER — CHLORHEXIDINE GLUCONATE 4 % EX LIQD
60.0000 mL | Freq: Once | CUTANEOUS | Status: DC
  Filled 2016-07-10: qty 60

## 2016-07-10 MED ORDER — MUPIROCIN 2 % EX OINT
1.0000 "application " | TOPICAL_OINTMENT | Freq: Once | CUTANEOUS | Status: AC
  Administered 2016-07-10: 1 via TOPICAL
  Filled 2016-07-10: qty 22

## 2016-07-10 MED ORDER — CLOPIDOGREL BISULFATE 75 MG PO TABS
75.0000 mg | ORAL_TABLET | Freq: Every day | ORAL | Status: DC
  Administered 2016-07-11: 75 mg via ORAL
  Filled 2016-07-10: qty 1

## 2016-07-10 MED ORDER — HEPARIN (PORCINE) IN NACL 2-0.9 UNIT/ML-% IJ SOLN
INTRAMUSCULAR | Status: AC
  Filled 2016-07-10: qty 500

## 2016-07-10 MED ORDER — PANTOPRAZOLE SODIUM 40 MG PO TBEC
40.0000 mg | DELAYED_RELEASE_TABLET | Freq: Every day | ORAL | Status: DC
  Administered 2016-07-10 – 2016-07-11 (×2): 40 mg via ORAL
  Filled 2016-07-10 (×2): qty 1

## 2016-07-10 MED ORDER — MIDAZOLAM HCL 5 MG/5ML IJ SOLN
INTRAMUSCULAR | Status: AC
  Filled 2016-07-10: qty 5

## 2016-07-10 MED ORDER — CEFAZOLIN IN D5W 1 GM/50ML IV SOLN
1.0000 g | Freq: Four times a day (QID) | INTRAVENOUS | Status: AC
  Administered 2016-07-10 – 2016-07-11 (×3): 1 g via INTRAVENOUS
  Filled 2016-07-10 (×3): qty 50

## 2016-07-10 MED ORDER — MUPIROCIN 2 % EX OINT
TOPICAL_OINTMENT | CUTANEOUS | Status: AC
  Filled 2016-07-10: qty 22

## 2016-07-10 MED ORDER — ACETAMINOPHEN 325 MG PO TABS
325.0000 mg | ORAL_TABLET | ORAL | Status: DC | PRN
  Administered 2016-07-10 – 2016-07-11 (×3): 650 mg via ORAL
  Filled 2016-07-10 (×3): qty 2

## 2016-07-10 MED ORDER — FENTANYL CITRATE (PF) 100 MCG/2ML IJ SOLN
INTRAMUSCULAR | Status: AC
  Filled 2016-07-10: qty 2

## 2016-07-10 MED ORDER — MOMETASONE FURO-FORMOTEROL FUM 100-5 MCG/ACT IN AERO
2.0000 | INHALATION_SPRAY | Freq: Two times a day (BID) | RESPIRATORY_TRACT | Status: DC
  Administered 2016-07-11: 2 via RESPIRATORY_TRACT
  Filled 2016-07-10: qty 8.8

## 2016-07-10 MED ORDER — CEFAZOLIN SODIUM-DEXTROSE 2-4 GM/100ML-% IV SOLN
INTRAVENOUS | Status: AC
  Filled 2016-07-10: qty 100

## 2016-07-10 MED ORDER — LIDOCAINE HCL (PF) 1 % IJ SOLN
INTRAMUSCULAR | Status: DC | PRN
  Administered 2016-07-10: 50 mL

## 2016-07-10 MED ORDER — INSULIN ASPART 100 UNIT/ML ~~LOC~~ SOLN
SUBCUTANEOUS | Status: AC
  Filled 2016-07-10: qty 1

## 2016-07-10 MED ORDER — CEFAZOLIN SODIUM-DEXTROSE 2-4 GM/100ML-% IV SOLN
2.0000 g | INTRAVENOUS | Status: AC
  Administered 2016-07-10: 2 g via INTRAVENOUS

## 2016-07-10 MED ORDER — FENTANYL CITRATE (PF) 100 MCG/2ML IJ SOLN
INTRAMUSCULAR | Status: DC | PRN
  Administered 2016-07-10 (×2): 25 ug via INTRAVENOUS

## 2016-07-10 MED ORDER — CARVEDILOL 6.25 MG PO TABS
6.2500 mg | ORAL_TABLET | Freq: Two times a day (BID) | ORAL | Status: DC
Start: 2016-07-10 — End: 2016-07-11
  Administered 2016-07-10 – 2016-07-11 (×2): 6.25 mg via ORAL
  Filled 2016-07-10 (×2): qty 1

## 2016-07-10 MED ORDER — SODIUM CHLORIDE 0.9 % IV SOLN
INTRAVENOUS | Status: DC
Start: 2016-07-10 — End: 2016-07-10
  Administered 2016-07-10: 06:00:00 via INTRAVENOUS

## 2016-07-10 MED ORDER — ASPIRIN EC 81 MG PO TBEC
81.0000 mg | DELAYED_RELEASE_TABLET | Freq: Every day | ORAL | Status: DC
  Administered 2016-07-11: 81 mg via ORAL
  Filled 2016-07-10: qty 1

## 2016-07-10 MED ORDER — SODIUM CHLORIDE 0.9 % IV SOLN
INTRAVENOUS | Status: DC
  Administered 2016-07-10: 06:00:00 via INTRAVENOUS

## 2016-07-10 MED ORDER — MIDAZOLAM HCL 5 MG/5ML IJ SOLN
INTRAMUSCULAR | Status: DC | PRN
  Administered 2016-07-10: 2 mg via INTRAVENOUS
  Administered 2016-07-10 (×2): 1 mg via INTRAVENOUS

## 2016-07-10 MED ORDER — LIDOCAINE HCL (PF) 1 % IJ SOLN
INTRAMUSCULAR | Status: AC
  Filled 2016-07-10: qty 60

## 2016-07-10 MED ORDER — INSULIN ASPART 100 UNIT/ML ~~LOC~~ SOLN
5.0000 [IU] | Freq: Three times a day (TID) | SUBCUTANEOUS | Status: DC
  Administered 2016-07-10 – 2016-07-11 (×2): 5 [IU] via SUBCUTANEOUS

## 2016-07-10 MED ORDER — SODIUM CHLORIDE 0.9 % IR SOLN
Status: AC
  Filled 2016-07-10: qty 2

## 2016-07-10 MED ORDER — MUPIROCIN 2 % EX OINT
1.0000 "application " | TOPICAL_OINTMENT | Freq: Two times a day (BID) | CUTANEOUS | Status: DC
  Administered 2016-07-10 – 2016-07-11 (×2): 1 via NASAL

## 2016-07-10 MED ORDER — FUROSEMIDE 40 MG PO TABS
40.0000 mg | ORAL_TABLET | Freq: Two times a day (BID) | ORAL | Status: DC
  Administered 2016-07-10 – 2016-07-11 (×2): 40 mg via ORAL
  Filled 2016-07-10 (×2): qty 1

## 2016-07-10 MED ORDER — SODIUM CHLORIDE 0.9 % IR SOLN
80.0000 mg | Status: AC
  Administered 2016-07-10: 80 mg

## 2016-07-10 MED ORDER — LISINOPRIL 2.5 MG PO TABS
2.5000 mg | ORAL_TABLET | Freq: Two times a day (BID) | ORAL | Status: DC
Start: 2016-07-10 — End: 2016-07-11
  Administered 2016-07-10 – 2016-07-11 (×3): 2.5 mg via ORAL
  Filled 2016-07-10 (×3): qty 1

## 2016-07-10 SURGICAL SUPPLY — 7 items
CABLE SURGICAL S-101-97-12 (CABLE) ×2 IMPLANT
ICD VISIA MRI VR DVFB1D4 (ICD Generator) ×1 IMPLANT
LEAD SPRINT QUAT SEC 6935M-62 (Lead) ×2 IMPLANT
PAD DEFIB LIFELINK (PAD) ×2 IMPLANT
SHEATH CLASSIC 9F (SHEATH) ×2 IMPLANT
TRAY PACEMAKER INSERTION (PACKS) ×2 IMPLANT
VISIA MRI VR DVFB1D4 (ICD Generator) ×2 IMPLANT

## 2016-07-10 NOTE — H&P (Signed)
ICD Criteria  Current LVEF:20%. Within 12 months prior to implant: Yes   Heart failure history: Yes, Class II  Cardiomyopathy history: Yes, Ischemic Cardiomyopathy.  Atrial Fibrillation/Atrial Flutter: No.  Ventricular tachycardia history: No.  Cardiac arrest history: No.  History of syndromes with risk of sudden death: No.  Previous ICD: No.  Current ICD indication: Primary  PPM indication: No.   Class I or II Bradycardia indication present: No  Beta Blocker therapy for 3 or more months: Yes, prescribed.   Ace Inhibitor/ARB therapy for 3 or more months: Yes, prescribed.   Travis Tran presents today with a history of ischemic cardiomyopathy.  He also has a history of dextrocardia.  He presents to the hospital today for ICD placement.  On exam, regular rhythm, no murmurs, lungs clear.  Risks and benefits of the procedure explained.  Risks include bleeding, infection, tamponade, and pneumothorax.  The patient understands these risks and has agreed to the procedure.  Aashka Salomone Elberta Fortis, MD 07/10/2016 7:13 AM

## 2016-07-10 NOTE — Progress Notes (Signed)
CBG 308; Richardean Canal RN notified

## 2016-07-10 NOTE — Progress Notes (Signed)
Inpatient Diabetes Program Recommendations  AACE/ADA: New Consensus Statement on Inpatient Glycemic Control (2015)  Target Ranges:  Prepandial:   less than 140 mg/dL      Peak postprandial:   less than 180 mg/dL (1-2 hours)      Critically ill patients:  140 - 180 mg/dL   Lab Results  Component Value Date   GLUCAP 221 (H) 07/10/2016   HGBA1C 15.1 (H) 02/27/2016    Inpatient Diabetes Program Recommendations:  A1c from 02/27/16 15.1. Please order new A1c to assess prehospital glycemic control.  Thank you, Billy Fischer. Aina Rossbach, RN, MSN, CDE Inpatient Glycemic Control Team Team Pager [phone] (8am-5pm) 07/10/2016 12:36 PM

## 2016-07-10 NOTE — Progress Notes (Signed)
Notified Merita Norton of CBG [id number]

## 2016-07-11 ENCOUNTER — Ambulatory Visit (HOSPITAL_COMMUNITY): Payer: Medicaid Other

## 2016-07-11 DIAGNOSIS — I5022 Chronic systolic (congestive) heart failure: Secondary | ICD-10-CM | POA: Diagnosis not present

## 2016-07-11 DIAGNOSIS — I255 Ischemic cardiomyopathy: Secondary | ICD-10-CM | POA: Diagnosis not present

## 2016-07-11 DIAGNOSIS — Q240 Dextrocardia: Secondary | ICD-10-CM | POA: Diagnosis not present

## 2016-07-11 DIAGNOSIS — I2510 Atherosclerotic heart disease of native coronary artery without angina pectoris: Secondary | ICD-10-CM | POA: Diagnosis not present

## 2016-07-11 LAB — GLUCOSE, CAPILLARY: Glucose-Capillary: 200 mg/dL — ABNORMAL HIGH (ref 65–99)

## 2016-07-11 MED ORDER — TRAMADOL HCL 50 MG PO TABS: 50.0000 mg | ORAL_TABLET | Freq: Four times a day (QID) | ORAL | 0 refills | Status: DC | PRN

## 2016-07-11 MED ORDER — INSULIN GLARGINE 100 UNIT/ML ~~LOC~~ SOLN: 10.0000 [IU] | Freq: Every day | SUBCUTANEOUS | Status: DC

## 2016-07-11 MED ORDER — MUPIROCIN 2 % EX OINT: 1.0000 "application " | TOPICAL_OINTMENT | Freq: Two times a day (BID) | CUTANEOUS | 0 refills | Status: AC

## 2016-07-11 MED ORDER — INSULIN ASPART 100 UNIT/ML ~~LOC~~ SOLN: 5.0000 [IU] | Freq: Three times a day (TID) | SUBCUTANEOUS | Status: DC

## 2016-07-11 MED FILL — Heparin Sodium (Porcine) 2 Unit/ML in Sodium Chloride 0.9%: INTRAMUSCULAR | Qty: 1000 | Status: AC

## 2016-07-11 NOTE — Discharge Summary (Signed)
ELECTROPHYSIOLOGY PROCEDURE DISCHARGE SUMMARY    Patient ID: Travis Tran,  MRN: [id number], DOB/AGE: 1956/05/29 60 y.o.  Admit date: 07/10/2016 Discharge date: 07/11/2016  Primary Care Physician: Pete Glatter, MD Primary Cardiologist: Dr. Katrinka Blazing Electrophysiologist: Dr. Elberta Fortis  Primary Discharge Diagnosis:  1. Mixed ischemic/nin-ischemic CM  Secondary Discharge Diagnosis:  1. Dextrocardia 2. Chronic CHF (systolic) 3. CAD 4. PVD (carotid stent)  Allergies  Allergen Reactions  . Celecoxib Swelling  . Sulfa Antibiotics Other (See Comments)    unspecified     Procedures This Admission:  1.  Implantation of a MDT single chamber ICD on 07/10/16 by Dr Elberta Fortis.  The patient received a Medtronic Visia AF MRI SureScan (serial  Number [id number]), and a Medtronic, model [id number] (serial number [id number]) right ventricular defibrillator lead.  DFT's were deferred at time of implan.  There were no immediate post procedure complications. 2.  CXR on 07/11/16 demonstrated no pneumothorax status post device implantation.   Brief HPI: Travis Tran is a 60 y.o. male was referred to electrophysiology in the outpatient setting for consideration of ICD implantation.  Past medical history includes mixed CM, chronic systolic CHF, HTN, CAD, PVD.  The patient has persistent LV dysfunction despite guideline directed therapy.  Risks, benefits, and alternatives to ICD implantation were reviewed with the patient who wished to proceed.   Hospital Course:  The patient was admitted and underwent implantation of an ICD with details as outlined above. He was monitored on telemetry overnight which demonstrated SR, infrequent PVC's.  Left chest was without hematoma or ecchymosis.  The device was interrogated and found to be functioning normally.  CXR was obtained and demonstrated no pneumothorax status post device implantation.  Wound care, arm mobility, and restrictions were reviewed with the patient.  The  patient was examined by Dr. Elberta Fortis and considered stable for discharge to home.  Noted that his record indicates hx of AF and DCCV, this however the patient denies, has not had DCCV or rhythm problems that he is aware of.  All of his EKG's in Epic were reviewed today as well as CV strips, no AF is noted.   We Travis Tran be able to monitor for any A arrhythmias via his device as well going forward.  The patient's discharge medications include an ACE-I (lisinopril) and beta blocker (carvedilol).   Physical Exam: Vitals:   07/10/16 2101 07/10/16 2254 07/11/16 0546 07/11/16 0709  BP: 135/68 135/68 (!) 143/90   Pulse: 74  75   Resp: 16  16   Temp: 98.4 F (36.9 C)  98.3 F (36.8 C)   TempSrc: Oral  Oral   SpO2: 97%  97% 100%  Weight:   153 lb 6.4 oz (69.6 kg)   Height:        GEN- The patient is well appearing, alert and oriented x 3 today.   HEENT: normocephalic, atraumatic; sclera clear, conjunctiva pink; hearing intact; oropharynx clear Lungs- Clear to ausculation bilaterally, normal work of breathing.  No wheezes, rales, rhonchi Heart- Regular rate and rhythm, no murmurs, rubs or gallops, PMI not laterally displaced GI- soft, non-tender, non-distended Extremities- no clubbing, cyanosis, or edema MS- no significant deformity or atrophy Skin- warm and dry, no rash or lesion, left chest without hematoma/ecchymosis Psych- euthymic mood, full affect Neuro- no gross defecits  Labs:   Lab Results  Component Value Date   WBC 9.4 07/05/2016   HGB 13.1 (L) 07/05/2016   HCT 40.4 07/05/2016   MCV 86.1 07/05/2016  PLT 233 07/05/2016     Recent Labs Lab 07/05/16 1151  NA 139  K 3.9  CL 103  CO2 30  BUN 25  CREATININE 1.01  CALCIUM 8.8  GLUCOSE 258*    Discharge Medications:    Medication List    TAKE these medications   aspirin 81 MG tablet Take 1 tablet (81 mg total) by mouth daily with breakfast.   atorvastatin 80 MG tablet Commonly known as:  LIPITOR Take 1 tablet  (80 mg total) by mouth daily at 6 PM.   budesonide-formoterol 80-4.5 MCG/ACT inhaler Commonly known as:  SYMBICORT Inhale 2 puffs into the lungs 2 (two) times daily.   carvedilol 6.25 MG tablet Commonly known as:  COREG Take 1 tablet (6.25 mg total) by mouth 2 (two) times daily with a meal.   clopidogrel 75 MG tablet Commonly known as:  PLAVIX Take 1 tablet (75 mg total) by mouth daily with breakfast.   furosemide 40 MG tablet Commonly known as:  LASIX Take 1 tablet (40 mg total) by mouth 2 (two) times daily.   insulin aspart 100 UNIT/ML injection Commonly known as:  novoLOG Inject 5-7 Units into the skin 3 (three) times daily with meals.   insulin glargine 100 UNIT/ML injection Commonly known as:  LANTUS Inject 0.1 mLs (10 Units total) into the skin at bedtime.   lisinopril 2.5 MG tablet Commonly known as:  PRINIVIL,ZESTRIL Take 1 tablet (2.5 mg total) by mouth 2 (two) times daily.   mupirocin ointment 2 % Commonly known as:  BACTROBAN Place 1 application into the nose 2 (two) times daily.   pantoprazole 40 MG tablet Commonly known as:  PROTONIX Take 1 tablet (40 mg total) by mouth daily.   spironolactone 25 MG tablet Commonly known as:  ALDACTONE Take 0.5 tablets (12.5 mg total) by mouth daily.   traMADol 50 MG tablet Commonly known as:  ULTRAM Take 1 tablet (50 mg total) by mouth every 6 (six) hours as needed for moderate pain.       Disposition:  Home Discharge Instructions    Diet - low sodium heart healthy    Complete by:  As directed   Increase activity slowly    Complete by:  As directed     Follow-up Information    [facility] Follow up on 07/19/2016.   Specialty:  Cardiology Why:  11:00AM, wound check Contact information: [address], [address] [place] [address] [phone]       Travis Mellin [name], MD Follow up on 10/16/2016.   Specialty:  Cardiology Why:  9:30AM Contact information: [address] [place] [address] [phone]           Duration of Discharge Encounter: Greater than 30 minutes including physician time.  Signed, Francis Dowse, PA-C 07/11/2016 9:05 AM     I have seen and examined this patient with Francis Dowse.  Agree with above, note added to reflect my findings.  On exam, regular rhythm, no murmurs, lungs clear.  Had single lead ICD placed.  No complications from the procedure.  CXR and interrogation without issues.  Plan for discharge today with follow up in device clinic in 10 days.    Juliah Scadden M. Adreanne Yono MD 07/11/2016 10:32 AM

## 2016-07-11 NOTE — Discharge Instructions (Signed)
° ° °  Supplemental Discharge Instructions for  Pacemaker/Defibrillator Patients  Activity No heavy lifting or vigorous activity with your left/right arm for 6 to 8 weeks.  Do not raise your left/right arm above your head for one week.  Gradually raise your affected arm as drawn below.             07/14/16                     07/15/16                     07/16/16                  07/17/16 __  NO DRIVING for 1 week    ; you may begin driving on  4/62/86   .  WOUND CARE - Keep the wound area clean and dry.  Do not get this area wet for one week. No showers for one week; you may shower on  07/17/16  . - The tape/steri-strips on your wound will fall off; do not pull them off.  No bandage is needed on the site.  DO  NOT apply any creams, oils, or ointments to the wound area. - If you notice any drainage or discharge from the wound, any swelling or bruising at the site, or you develop a fever > 101? F after you are discharged home, call the office at once.  Special Instructions - You are still able to use cellular telephones; use the ear opposite the side where you have your pacemaker/defibrillator.  Avoid carrying your cellular phone near your device. - When traveling through airports, show security personnel your identification card to avoid being screened in the metal detectors.  Ask the security personnel to use the hand wand. - Avoid arc welding equipment, MRI testing (magnetic resonance imaging), TENS units (transcutaneous nerve stimulators).  Call the office for questions about other devices. - Avoid electrical appliances that are in poor condition or are not properly grounded. - Microwave ovens are safe to be near or to operate.  Additional information for defibrillator patients should your device go off: - If your device goes off ONCE and you feel fine afterward, notify the device clinic nurses. - If your device goes off ONCE and you do not feel well afterward, call 911. - If your device goes  off TWICE, call 911. - If your device goes off THREE times in one day, call 911.  DO NOT DRIVE YOURSELF OR A FAMILY MEMBER WITH A DEFIBRILLATOR TO THE HOSPITAL--CALL 911.

## 2016-07-11 NOTE — Progress Notes (Signed)
D/c instructions reviewed with pt. Copy of instructions and script given to pt. ICD instructions reviewed by PA this am, pt given copy of those instructions, pt able to verbalize care of site and restrictions. Pt d/c'd via wheelchair with family and belongings, escorted by hospital volunteer.

## 2016-07-11 NOTE — Plan of Care (Signed)
Problem: Education: Goal: Knowledge of cardiac device and self-care will improve Outcome: Progressing Patient was given information regarding what the ICD was, how to care for incision post-op and when to call for help, he will read and will review at a later time at his request.

## 2016-07-19 ENCOUNTER — Ambulatory Visit (INDEPENDENT_AMBULATORY_CARE_PROVIDER_SITE_OTHER): Payer: Medicaid Other | Admitting: *Deleted

## 2016-07-19 ENCOUNTER — Encounter: Payer: Self-pay | Admitting: Cardiology

## 2016-07-19 DIAGNOSIS — I255 Ischemic cardiomyopathy: Secondary | ICD-10-CM | POA: Diagnosis not present

## 2016-07-19 LAB — CUP PACEART INCLINIC DEVICE CHECK
Date Time Interrogation Session: 20170817140025
HIGH POWER IMPEDANCE MEASURED VALUE: 56 Ohm
Implantable Lead Implant Date: 20170808
Implantable Lead Location: 753860
Lead Channel Pacing Threshold Amplitude: 0.625 V
Lead Channel Pacing Threshold Pulse Width: 0.4 ms
Lead Channel Sensing Intrinsic Amplitude: 18.875 mV
Lead Channel Sensing Intrinsic Amplitude: 23.75 mV
MDC IDC MSMT BATTERY REMAINING LONGEVITY: 134 mo
MDC IDC MSMT BATTERY VOLTAGE: 3.04 V
MDC IDC MSMT LEADCHNL RV IMPEDANCE VALUE: 342 Ohm
MDC IDC MSMT LEADCHNL RV IMPEDANCE VALUE: 399 Ohm
MDC IDC SET LEADCHNL RV PACING AMPLITUDE: 3.5 V
MDC IDC SET LEADCHNL RV PACING PULSEWIDTH: 0.4 ms
MDC IDC SET LEADCHNL RV SENSING SENSITIVITY: 0.3 mV
MDC IDC STAT BRADY RV PERCENT PACED: 0.01 %

## 2016-07-19 NOTE — Progress Notes (Signed)
Wound check appointment. Steri-strips removed. Wound without redness or edema. Incision edges approximated, wound well healed. Small pinhole sized opening noted at R corner of incision. Steri strips applied. Patient to remove strips in 3 days. Patient encouraged to call if he notices any redness, drainage, or edema. Dr.Camnitz is aware and agrees with plan.  ICD check in office. Normal device function. Threshold, sensing, and impedances consistent with implant measurements. Device programmed at 3.5V for extra safety margin until 3 month visit. Histogram distribution appropriate for patient and level of activity. No AF episodes or ventricular arrhythmias noted. Patient educated about wound care, arm mobility, lifting restrictions, shock plan. ROV in 3 months with WC.

## 2016-07-27 ENCOUNTER — Emergency Department (HOSPITAL_COMMUNITY)
Admission: EM | Admit: 2016-07-27 | Discharge: 2016-07-28 | Disposition: A | Discharge status: Home / Self Care | Payer: Medicaid Other | Attending: Physician Assistant | Admitting: Physician Assistant

## 2016-07-27 ENCOUNTER — Encounter (HOSPITAL_COMMUNITY): Payer: Self-pay | Admitting: Vascular Surgery

## 2016-07-27 DIAGNOSIS — E119 Type 2 diabetes mellitus without complications: Secondary | ICD-10-CM | POA: Diagnosis not present

## 2016-07-27 DIAGNOSIS — T465X5A Adverse effect of other antihypertensive drugs, initial encounter: Secondary | ICD-10-CM | POA: Diagnosis not present

## 2016-07-27 DIAGNOSIS — J45909 Unspecified asthma, uncomplicated: Secondary | ICD-10-CM | POA: Insufficient documentation

## 2016-07-27 DIAGNOSIS — I952 Hypotension due to drugs: Secondary | ICD-10-CM | POA: Diagnosis not present

## 2016-07-27 DIAGNOSIS — I509 Heart failure, unspecified: Secondary | ICD-10-CM | POA: Diagnosis not present

## 2016-07-27 DIAGNOSIS — Z8673 Personal history of transient ischemic attack (TIA), and cerebral infarction without residual deficits: Secondary | ICD-10-CM | POA: Diagnosis not present

## 2016-07-27 DIAGNOSIS — Z7982 Long term (current) use of aspirin: Secondary | ICD-10-CM | POA: Diagnosis not present

## 2016-07-27 DIAGNOSIS — Z79899 Other long term (current) drug therapy: Secondary | ICD-10-CM | POA: Insufficient documentation

## 2016-07-27 DIAGNOSIS — N182 Chronic kidney disease, stage 2 (mild): Secondary | ICD-10-CM | POA: Insufficient documentation

## 2016-07-27 DIAGNOSIS — Z951 Presence of aortocoronary bypass graft: Secondary | ICD-10-CM | POA: Diagnosis not present

## 2016-07-27 DIAGNOSIS — Z794 Long term (current) use of insulin: Secondary | ICD-10-CM | POA: Diagnosis not present

## 2016-07-27 DIAGNOSIS — I959 Hypotension, unspecified: Secondary | ICD-10-CM | POA: Diagnosis present

## 2016-07-27 LAB — CBC
HCT: 44.2 % (ref 39.0–52.0)
Hemoglobin: 14.7 g/dL (ref 13.0–17.0)
MCH: 28.6 pg (ref 26.0–34.0)
MCHC: 33.3 g/dL (ref 30.0–36.0)
MCV: 86 fL (ref 78.0–100.0)
PLATELETS: 305 10*3/uL (ref 150–400)
RBC: 5.14 MIL/uL (ref 4.22–5.81)
RDW: 13.4 % (ref 11.5–15.5)
WBC: 16.8 10*3/uL — AB (ref 4.0–10.5)

## 2016-07-27 LAB — COMPREHENSIVE METABOLIC PANEL
ALT: 16 U/L — AB (ref 17–63)
AST: 18 U/L (ref 15–41)
Albumin: 3.4 g/dL — ABNORMAL LOW (ref 3.5–5.0)
Alkaline Phosphatase: 65 U/L (ref 38–126)
Anion gap: 4 — ABNORMAL LOW (ref 5–15)
BILIRUBIN TOTAL: 0.7 mg/dL (ref 0.3–1.2)
BUN: 14 mg/dL (ref 6–20)
CALCIUM: 9.2 mg/dL (ref 8.9–10.3)
CO2: 28 mmol/L (ref 22–32)
CREATININE: 1.04 mg/dL (ref 0.61–1.24)
Chloride: 103 mmol/L (ref 101–111)
GFR calc Af Amer: >60 mL/min (ref >60)
Glucose, Bld: 94 mg/dL (ref 65–99)
POTASSIUM: 3.5 mmol/L (ref 3.5–5.1)
Sodium: 135 mmol/L (ref 135–145)
TOTAL PROTEIN: 6.8 g/dL (ref 6.5–8.1)

## 2016-07-27 LAB — I-STAT TROPONIN, ED: TROPONIN I, POC: 0.02 ng/mL (ref 0.00–0.08)

## 2016-07-27 LAB — CBG MONITORING, ED: GLUCOSE-CAPILLARY: 145 mg/dL — AB (ref 65–99)

## 2016-07-27 LAB — LIPASE, BLOOD: Lipase: 25 U/L (ref 11–51)

## 2016-07-27 NOTE — ED Triage Notes (Signed)
Pt reports to the ED for eval of low back and abd pain/aching all last night and into today. He also reports after lunch today he took his BP and he noticed he was hypotensive at 90s over 50s systolic. Pt recently had a defibrillator inserted so he was concerned. Denied any shock from the defib. Pt reports some nausea denies any active V/D.

## 2016-07-27 NOTE — ED Notes (Signed)
On reassessment of pt, pt is pale, diaphoretic and decreased responsiveness. Initially pt would only open his eyes, now responding again verbally. Vitals and cbg assessed. Pt taken to treatment room
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
## 2016-07-28 ENCOUNTER — Emergency Department (HOSPITAL_COMMUNITY): Payer: Medicaid Other

## 2016-07-28 MED ORDER — SODIUM CHLORIDE 0.9 % IV BOLUS (SEPSIS)
500.0000 mL | Freq: Once | INTRAVENOUS | Status: AC
  Administered 2016-07-28: 1000 mL via INTRAVENOUS

## 2016-07-28 NOTE — ED Notes (Signed)
Patient transported to X-ray 

## 2016-07-28 NOTE — ED Notes (Signed)
Pt verbalized understanding of discharge instructions and follow-up care. PT verbalized understanding of change of medication administration. Denies further questions at this time.

## 2016-07-28 NOTE — Discharge Instructions (Signed)
We talked to coverage for her cardiologist. Please decrease her carvedilol down to 3.125 twice a day. This is cutting the amount and half. Please return if you have any symptoms of hypotension. Please return with any other concerns such as chest pain. Please call your cardiologist and make an appointment as soon as possible.

## 2016-07-28 NOTE — ED Notes (Signed)
Pt given a sandwich

## 2016-07-30 ENCOUNTER — Telehealth: Payer: Self-pay | Admitting: Interventional Cardiology

## 2016-07-30 DIAGNOSIS — Z95810 Presence of automatic (implantable) cardiac defibrillator: Secondary | ICD-10-CM | POA: Insufficient documentation

## 2016-07-30 NOTE — Telephone Encounter (Signed)
Did not need an encounter,pt needs an appt.

## 2016-07-31 ENCOUNTER — Ambulatory Visit (INDEPENDENT_AMBULATORY_CARE_PROVIDER_SITE_OTHER): Payer: Medicaid Other | Admitting: Interventional Cardiology

## 2016-07-31 ENCOUNTER — Encounter: Payer: Self-pay | Admitting: Interventional Cardiology

## 2016-07-31 VITALS — BP 100/60 | HR 84 | Ht 66.0 in | Wt 153.4 lb

## 2016-07-31 DIAGNOSIS — I10 Essential (primary) hypertension: Secondary | ICD-10-CM | POA: Diagnosis not present

## 2016-07-31 DIAGNOSIS — I25810 Atherosclerosis of coronary artery bypass graft(s) without angina pectoris: Secondary | ICD-10-CM | POA: Diagnosis not present

## 2016-07-31 DIAGNOSIS — Z95810 Presence of automatic (implantable) cardiac defibrillator: Secondary | ICD-10-CM | POA: Diagnosis not present

## 2016-07-31 DIAGNOSIS — Q240 Dextrocardia: Secondary | ICD-10-CM

## 2016-07-31 DIAGNOSIS — I5022 Chronic systolic (congestive) heart failure: Secondary | ICD-10-CM

## 2016-07-31 DIAGNOSIS — I63232 Cerebral infarction due to unspecified occlusion or stenosis of left carotid arteries: Secondary | ICD-10-CM

## 2016-07-31 MED ORDER — CARVEDILOL 3.125 MG PO TABS: 3.1250 mg | ORAL_TABLET | Freq: Two times a day (BID) | ORAL | 4 refills | Status: DC

## 2016-07-31 MED ORDER — SPIRONOLACTONE 25 MG PO TABS: ORAL_TABLET | ORAL | 4 refills | Status: DC

## 2016-07-31 NOTE — Patient Instructions (Signed)
Medication Instructions:  Decrease Spironolactone to 12.5mg  on Mondays /Wednesdays/Fridays. This will be 1/2 of a 25mg  tablet on Mondays/Wednesdays/Fridays  Decrease coreg (carvedilol) to 3.125mg  two times a day. You can take 1/2 of your 6.25mg  tablet two times a day and use your current supply.  Do not take lisinopril if your systolic blood pressure(top number) is less than 100.  Labwork: none  Testing/Procedures: none  Follow-Up: Your physician recommends that you keep your appointment already scheduled with Dr in November.   Any Other Special Instructions Will Be Listed Below (If Applicable).     If you need a refill on your cardiac medications before your next appointment, please call your pharmacy.
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
## 2016-09-19 ENCOUNTER — Encounter: Payer: Self-pay | Admitting: Neurology

## 2016-09-19 ENCOUNTER — Ambulatory Visit (INDEPENDENT_AMBULATORY_CARE_PROVIDER_SITE_OTHER): Payer: Medicaid Other | Admitting: Neurology

## 2016-09-19 VITALS — BP 119/72 | HR 82 | Ht 66.0 in | Wt 152.2 lb

## 2016-09-19 DIAGNOSIS — I25709 Atherosclerosis of coronary artery bypass graft(s), unspecified, with unspecified angina pectoris: Secondary | ICD-10-CM | POA: Diagnosis not present

## 2016-09-19 DIAGNOSIS — I255 Ischemic cardiomyopathy: Secondary | ICD-10-CM

## 2016-09-19 DIAGNOSIS — I5022 Chronic systolic (congestive) heart failure: Secondary | ICD-10-CM

## 2016-09-19 DIAGNOSIS — Q893 Situs inversus: Secondary | ICD-10-CM | POA: Diagnosis not present

## 2016-09-19 DIAGNOSIS — Z95810 Presence of automatic (implantable) cardiac defibrillator: Secondary | ICD-10-CM | POA: Diagnosis not present

## 2016-09-19 DIAGNOSIS — I63232 Cerebral infarction due to unspecified occlusion or stenosis of left carotid arteries: Secondary | ICD-10-CM

## 2016-09-19 MED ORDER — CLOPIDOGREL BISULFATE 75 MG PO TABS: 75.0000 mg | ORAL_TABLET | Freq: Every day | ORAL | 11 refills | Status: DC

## 2016-09-19 NOTE — Patient Instructions (Addendum)
-   continue ASA and lipitor for stroke and cardiac prevention - would like to resume plavix at this time given extensive hx of CAD, hx of carotid stent and low EF - will repeat echocardiogram to evaluate heart function - will set up appointment with Dr. Corliss Skains for right ICA stent follow up - - follow up with cardiology for next month.  - Follow up with your primary care physician for stroke risk factor modification. Recommend maintain blood pressure goal 120-140/80, diabetes with hemoglobin A1c goal below 7.0% and lipids with LDL cholesterol goal below 70 mg/dL.  - check BP at home and goal 120-140 due to carotid occlusion. - follow up in 4 months.

## 2016-09-19 NOTE — Progress Notes (Signed)
STROKE NEUROLOGY FOLLOW UP NOTE  NAME: Travis Tran DOB: [birth year]  REASON FOR VISIT: stroke follow up HISTORY FROM: no one  Today we had the pleasure of seeing Travis Tran in follow-up at our Neurology Clinic. Pt was accompanied by pt and chart.   History Summary [name] is a 60 y.o. male with history of situs inversus, DM, HTN, CAD, cardiomyopathy, HLD, s/p CABG x5, and a recent diagnosis of CHF w/known EF 10% was admitted on 03/20/16 for motor aphasia with garbled speech and R sided weakness and numbness. Symptoms resolved. MRI showed no acute infarct. CTA head and neck showed left ICA occlusion with some reconstitution at terminus. Right ICA origin 65%. Cerebral angio showed left ICA occlusion with right ICA 80-85% stenosis at petrous portion. CUS right ICA 60-79% stenosis. EF 15%, LDL 120 and A1C 15.1. His left brain TIA was considered due to left ICA occlusion and right ICA high grade stenosis in the setting of low BP (SBP 90-100s). He received right ICA intracranial stent. His BP still low at 100s post procedure due to multiple BP meds for his heart failure. Recommended BP goal 120-140 and compression socks. He was discharged on ASA and plavix and high dose lipitor.   06/13/16 follow up - the patient has been doing well. No stroke like symptoms. He follows with cardiology Dr. Katrinka BlazingSmith and repeat TTE in 05/2016 showed EF 20%. He was recently referred to Dr. Elberta Fortisamnitz for ICD placement. Pt is yet to make up his decision. BP today 115/65. He is on coreg, lasix, spironolactone, and lisinopril. He feels some lightheadedness soon after his BP medication in the morning but not severe. He is continued on ASA and plavix and lipitor    Interval History During the interval time, pt has been doing well. He followed with cardiology and had ICD placement on 07/10/16 for low EF. Before the procedure his plavix was not refilled and post op his plavix was not restarted. However, he still on ASA and  lipitor daily. Repeat CTA head and neck in 06/2016 showed patent petrous ICA stent. Pt otherwise no complains, on lantus, glucose 150-250 at home. BP toady 119/72. Has not see Dr. Corliss Skainseveshwar yet since the ICA stent.   REVIEW OF SYSTEMS: Full 14 system review of systems performed and notable only for those listed below and in HPI above, all others are negative:  Constitutional:   Cardiovascular:  Ear/Nose/Throat:  Light sensitivity, blurry vision Skin:  Eyes:   Respiratory:   Gastroitestinal:   Genitourinary:  Hematology/Lymphatic:   Endocrine:  Musculoskeletal:  Joint pain Allergy/Immunology:   Neurological:   Psychiatric:  Sleep:   The following represents the patient's updated allergies and side effects list: Allergies  Allergen Reactions  . Celecoxib Swelling  . Sulfa Antibiotics Other (See Comments)    unspecified    The neurologically relevant items on the patient's problem list were reviewed on today's visit.  Neurologic Examination  A problem focused neurological exam (12 or more points of the single system neurologic examination, vital signs counts as 1 point, cranial nerves count for 8 points) was performed.  Blood pressure 119/72, pulse 82, height 5\' 6"  (1.676 m), weight 152 lb 3.2 oz (69 kg).  General - Well nourished, well developed, in no apparent distress.  Ophthalmologic - Sharp disc margins OU.   Cardiovascular - Regular rate and rhythm with no murmur.  Mental Status -  Level of arousal and orientation to time, place, and person were intact.  Language including expression, naming, repetition, comprehension was assessed and found intact. Fund of Knowledge was assessed and was intact.  Cranial Nerves II - XII - II - Visual field intact OU. III, IV, VI - Extraocular movements intact. V - Facial sensation intact bilaterally. VII - Facial movement intact bilaterally. VIII - Hearing & vestibular intact bilaterally. X - Palate elevates symmetrically. XI -  Chin turning & shoulder shrug intact bilaterally. XII - Tongue protrusion intact.  Motor Strength - The patient's strength was normal in all extremities and pronator drift was absent.  Bulk was normal and fasciculations were absent.   Motor Tone - Muscle tone was assessed at the neck and appendages and was normal.  Reflexes - The patient's reflexes were 1+ in all extremities and he had no pathological reflexes.  Sensory - Light touch, temperature/pinprick, vibration and proprioception, and Romberg testing were assessed and were normal.    Coordination - The patient had normal movements in the hands and feet with no ataxia or dysmetria.  Tremor was absent.  Gait and Station - The patient's transfers, posture, gait, station, and turns were observed as normal.   Data reviewed: I personally reviewed the images and agree with the radiology interpretations.  Ct Head Wo Contrast 03/20/2016 No acute intracranial abnormalities. Mild RIGHT maxillary sinus disease.   Ct Angio Head & Neck W/cm &/or Wo/cm 03/20/2016 1. 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 2. 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 Reconstituted flow at the left ICA terminus such that the left MCA and ACA origins remain normal. 3. Very advanced for age atherosclerosis in the head and neck. Additional atherosclerotic moderate or severe stenosis: - Right ICA origin (65%), - Right ICA siphon, - Left Vertebral artery V4 segment, - Right PCA P2 segment (moderate) - Left ACA callosomarginal artery origin. 4. No cortically based acute infarct identified.   Mr Brain Wo Contrast 03/20/2016 No acute infarct or intracranial hemorrhage. Abnormal appearance of the left internal carotid artery. Please see CT angiogram report. Mild chronic microvascular changes most notable left parietal lobe.  Right parietal region small arachnoid cyst may be present as noted above. Moderate mucosal thickening right maxillary sinus.   Cerebral angio 1.Occluded LT ICA at the cavernous seg at the opthalmic artery level. 2.Approx 80 % to 85% stenosis of RT ICA petrous -cavernous junction. 3.Approx 75 to 80% stenosis of LT VBJ distal to LT PICA origin.  TTE 03/21/16 - Left ventricle: The cavity size was normal. Systolic function was  normal. The estimated ejection fraction was 15%. Severe diffuse  hypokinesis with regional variations. There is akinesis of the  entireapical myocardium. There is akinesis of the  mid-apicallateral myocardium. - Atrial septum: No defect or patent foramen ovale was identified. - Impressions: Limited study. Severe LVE. EF 15% Diffuse  hypokinesis  RV normal size  Bubble study negative for right to left shunt. Impressions: - Limited study. Severe LVE. EF 15% Diffuse hypokinesis  RV normal size  Bubble study negative for right to left shunt.  TTE 05/07/2016 - Procedure narrative: Transthoracic echocardiography. Image  quality was adequate. Intravenous contrast (Definity) was  administered. - Status, risk factors: Dextrocardia. - Left ventricle: The cavity size was normal. Wall thickness was  normal. The estimated ejection fraction was 20%. Diffuse  hypokinesis. No LV thrombus. Doppler parameters are consistent  with abnormal left ventricular relaxation (grade  1 diastolic  dysfunction). - Aortic valve: There was no stenosis. - Mitral valve: There was trivial regurgitation. - Left atrium: The atrium was mildly dilated. - Right ventricle: The cavity size was normal. Systolic function  was moderately reduced. - Right atrium: The atrium was mildly dilated. - Pulmonary arteries: No complete TR doppler jet so unable to  estimate PA systolic pressure. - Inferior vena cava: The vessel was normal in size. The  respirophasic diameter changes were in the  normal range (= 50%),  consistent with normal central venous pressure. Impressions: - Dextrocardia. Normal LV size with EF 20%, diffuse hypokinesis.  Normal RV size with moderately decreased systolic function. No  significant valvular abnormalities.  CUS - - Right - 60% to 79% ICA stenosis at the bifurcation/ proximal ICA.  Moderate heterogeneous plaque. - Right - - Left - Moderate heterogeneous plaque noted in the bifurcation  with diminished velocities throughout the ICA with a abnormal  damped waveform and loss of diastolic flow suggestive of a  probable more distal obstruction. - Bilateral - Vertebral artery flow is antegrade.  CTA head and neck 06/21/16 1. Patent stent of the right petrocavernous junction of ICA. 2. Stable 40% stenosis of right ICA origin. 3. Stable long segment of stenosis of the left internal carotid artery with probable occlusion of the terminus (possible trickle flow) without significant interval change. 4. Patent circle of Willis with left MCA distribution provided primarily by anterior communicating artery and pial collaterals. The left MCA is overall mildly diminished in comparison with the right. 5. Stable stenosis of the left vertebral artery origin partially obscured by streak and motion artifact. 6. Stable stenosis of the left proximal V4 segment. 7. No evidence of large acute infarct, mass effect, intracranial hemorrhage, or abnormal enhancement of the brain.  Component     Latest Ref Rng 02/27/2016 02/28/2016 03/23/2016 03/24/2016  Cholesterol     0 - 200 mg/dL  244    Triglycerides     <150 mg/dL  010    HDL Cholesterol     >40 mg/dL  49    Total CHOL/HDL Ratio       3.9    VLDL     0 - 40 mg/dL  22    LDL (calc)     0 - 99 mg/dL  272 (H)    Hemoglobin A1C     4.8 - 5.6 % 15.1 (H)     Mean Plasma Glucose      387     TSH     0.350 - 4.500 uIU/mL 1.446     HIV     Non Reactive Non Reactive     Platelet Function  P2Y12     194  - 418 PRU   113 (L) 85 (L)    Assessment: As you may recall, he is a 59 y.o. Caucasian male with PMH of situs inversus, DM, HTN, CAD, HLD, s/p CABG x5, and CHF w/known EF 10% was admitted on 03/20/16 for transient motor aphasia and R sided weakness and numbness. MRI showed no acute infarct. CTA head and neck showed left ICA occlusion. Right ICA origin 65%. Cerebral angio showed left ICA occlusion with right ICA 80-85% stenosis at petrous portion. CUS right ICA 60-79% stenosis. EF 15%, LDL 120 and A1C 15.1. His left brain TIA was considered due to left ICA occlusion and right ICA high grade stenosis in the setting of low BP (SBP 90-100s). He received right ICA intracranial stent. His BP  still low at 100s post procedure due to multiple BP meds for his heart failure. Recommended BP goal 120-140 and compression socks. He was discharged on ASA and plavix and high dose lipitor. During the interval time, the patient has been doing well. Repeat TTE in 05/2016 showed EF 20%. Repeat CTA head and neck in 06/2016 showed patent right petrous ICA stent. Left ICA still occluded. He had ICD placement in 07/2016. However, his plavix was not continued post op. Has not seen Dr. Corliss Skains for stent follow up yet.   Plan:  - continue ASA and lipitor for stroke and cardiac prevention - would like to resume plavix at this time given extensive hx of CAD, hx of carotid stent and low EF - will repeat echocardiogram to evaluate heart function - will set up appointment with Dr. Corliss Skains for right ICA stent follow up - follow up with cardiology for next month.  - Follow up with your primary care physician for stroke risk factor modification. Recommend maintain blood pressure goal 120-140/80, diabetes with hemoglobin A1c goal below 7.0% and lipids with LDL cholesterol goal below 70 mg/dL.  - check BP at home and goal 120-140 due to carotid occlusion. - follow up in 4 months.   I spent more than 25 minutes of face to face time with  the patient. Greater than 50% of time was spent in counseling and coordination of care. We discussed BP management, resume plavix, and follow up with Dr. Corliss Skains.    Orders Placed This Encounter  Procedures  . ECHOCARDIOGRAM COMPLETE    Standing Status:   Future    Standing Expiration Date:   12/19/2017    Order Specific Question:   Where should this test be performed    Answer:   [facility])    Order Specific Question:   Does the patient weigh less than or greater than 250 lbs?    Answer:   Patient weighs less than 250 lbs    Order Specific Question:   Complete or Limited study?    Answer:   Complete    Order Specific Question:   With Image Enhancing Agent or without Image Enhancing Agent?    Answer:   With Image Enhancing Agent    Order Specific Question:   Reason for exam-Echo    Answer:   Stroke  434.91 / I163.9    Meds ordered this encounter  Medications  . clopidogrel (PLAVIX) 75 MG tablet    Sig: Take 1 tablet (75 mg total) by mouth daily.    Dispense:  30 tablet    Refill:  11    Patient Instructions  - continue ASA and lipitor for stroke and cardiac prevention - would like to resume plavix at this time given extensive hx of CAD, hx of carotid stent and low EF - will repeat echocardiogram to evaluate heart function - will set up appointment with Dr. Corliss Skains for right ICA stent follow up - - follow up with cardiology for next month.  - Follow up with your primary care physician for stroke risk factor modification. Recommend maintain blood pressure goal 120-140/80, diabetes with hemoglobin A1c goal below 7.0% and lipids with LDL cholesterol goal below 70 mg/dL.  - check BP at home and goal 120-140 due to carotid occlusion. - follow up in 4 months.     Marvel Plan, MD PhD [facility] [address], [address] [place], [place] [address] [phone]

## 2016-09-27 ENCOUNTER — Telehealth (HOSPITAL_COMMUNITY): Payer: Self-pay | Admitting: Radiology

## 2016-09-27 NOTE — Telephone Encounter (Signed)
Called pt, left VM for him to call to reschedule his follow-up with Deveshwar. Pt was a "NO SHOW" at his last scheduled appt. JM

## 2016-09-28 ENCOUNTER — Telehealth (HOSPITAL_COMMUNITY): Payer: Self-pay | Admitting: Radiology

## 2016-09-28 ENCOUNTER — Telehealth: Payer: Self-pay

## 2016-09-28 NOTE — Telephone Encounter (Signed)
Contacted pt. States he does have his new cologuard kit due to the other sample could not be processed. He is wanting to talk to his insurance company before he completes the test.

## 2016-09-28 NOTE — Telephone Encounter (Signed)
Called pt, left VM for pt to call to schedule consult. JM

## 2016-10-04 ENCOUNTER — Telehealth: Payer: Self-pay | Admitting: Internal Medicine

## 2016-10-04 NOTE — Telephone Encounter (Signed)
Patient came to the office to speak with nurse to request a referral to a specialist because he has arthritis. Pt is in a lot of pain. Please follow up. If he cant be reached at home please call at [phone]  Thank you

## 2016-10-04 NOTE — Telephone Encounter (Signed)
Will forward to pcp

## 2016-10-06 ENCOUNTER — Other Ambulatory Visit: Payer: Self-pay | Admitting: Internal Medicine

## 2016-10-06 DIAGNOSIS — M069 Rheumatoid arthritis, unspecified: Secondary | ICD-10-CM

## 2016-10-06 NOTE — Telephone Encounter (Signed)
I made ref to rheumatology for him, we having seen him since April, needs f/u appt. May take weeks to see Rheum, we can see if can do something shorterm until he sees Rheum.

## 2016-10-09 NOTE — Telephone Encounter (Signed)
Contact pt to make aware of referral and etc pt didn't answer and was unable to lvm

## 2016-10-10 NOTE — Telephone Encounter (Signed)
No answer phone rung greater than 5 times then call ended.    Letter generated.

## 2016-10-12 ENCOUNTER — Ambulatory Visit (HOSPITAL_COMMUNITY)
Admission: RE | Admit: 2016-10-12 | Discharge: 2016-10-12 | Disposition: A | Discharge status: Home / Self Care | Payer: Medicaid Other | Source: Ambulatory Visit | Attending: Interventional Radiology | Admitting: Interventional Radiology

## 2016-10-12 DIAGNOSIS — I771 Stricture of artery: Secondary | ICD-10-CM

## 2016-10-12 HISTORY — PX: IR GENERIC HISTORICAL: IMG1180011

## 2016-10-15 ENCOUNTER — Encounter (HOSPITAL_COMMUNITY): Payer: Self-pay | Admitting: Interventional Radiology
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
## 2016-10-16 ENCOUNTER — Other Ambulatory Visit: Payer: Self-pay | Admitting: *Deleted

## 2016-10-16 ENCOUNTER — Encounter: Payer: Self-pay | Admitting: Cardiology

## 2016-10-16 ENCOUNTER — Ambulatory Visit (INDEPENDENT_AMBULATORY_CARE_PROVIDER_SITE_OTHER): Payer: Medicaid Other | Admitting: Cardiology

## 2016-10-16 ENCOUNTER — Other Ambulatory Visit (HOSPITAL_COMMUNITY): Payer: Self-pay | Admitting: Interventional Radiology

## 2016-10-16 ENCOUNTER — Telehealth (HOSPITAL_COMMUNITY): Payer: Self-pay

## 2016-10-16 VITALS — BP 126/84 | HR 95 | Ht 66.0 in | Wt 152.0 lb

## 2016-10-16 DIAGNOSIS — I771 Stricture of artery: Secondary | ICD-10-CM

## 2016-10-16 DIAGNOSIS — Z95810 Presence of automatic (implantable) cardiac defibrillator: Secondary | ICD-10-CM

## 2016-10-16 DIAGNOSIS — I255 Ischemic cardiomyopathy: Secondary | ICD-10-CM

## 2016-10-16 DIAGNOSIS — R0789 Other chest pain: Secondary | ICD-10-CM | POA: Diagnosis not present

## 2016-10-16 MED ORDER — CARVEDILOL 6.25 MG PO TABS: 6.2500 mg | ORAL_TABLET | Freq: Two times a day (BID) | ORAL | 10 refills | Status: DC

## 2016-10-16 MED ORDER — CARVEDILOL 6.25 MG PO TABS: 6.2500 mg | ORAL_TABLET | Freq: Two times a day (BID) | ORAL | Status: DC

## 2016-10-16 NOTE — Patient Instructions (Signed)
Medication Instructions: - Your physician has recommended you make the following change in your medication:  1) increase coreg (carvedilol) to 6.25 mg one tablet by mouth twice daily  Labwork: - none ordered  Procedures/Testing: - A chest x-ray takes a picture of the organs and structures inside the chest, including the heart, lungs, and blood vessels. This test can show several things, including, whether the heart is enlarges; whether fluid is building up in the lungs; and whether pacemaker / defibrillator leads are still in place.  ** You may have this done at the [facility], located in the [facility] Building- 1st floor- you do not have to have an appointment **   [address] [place], [place] [address] [phone] Monday=Friday 8am- 5pm  Follow-Up: - Remote monitoring is used to monitor your Pacemaker of ICD from home. This monitoring reduces the number of office visits required to check your device to one time per year. It allows [place] to keep an eye on the functioning of your device to ensure it is working properly. You are scheduled for a device check from home on 01/15/17. You may send your transmission at any time that day. If you have a wireless device, the transmission will be sent automatically. After your physician reviews your transmission, you will receive a postcard with your next transmission date.  - Your physician wants you to follow-up in: 9 months with Dr. Elberta Fortis.  You will receive a reminder letter in the mail two months in advance. If you don't receive a letter, please call our office to schedule the follow-up appointment.   Any Additional Special Instructions Will Be Listed Below (If Applicable).     If you need a refill on your cardiac medications before your next appointment, please call your pharmacy.

## 2016-10-16 NOTE — Progress Notes (Signed)
Received ICM referral from Azalia Bilis, Device nurse/Dr [employer].  ICM letter given at time of office visit.  1st ICM remote transmission scheduled for 11/16/2016.

## 2016-10-16 NOTE — Telephone Encounter (Signed)
Called to schedule ct scan, left message for pt to return call. AW 

## 2016-10-17 ENCOUNTER — Ambulatory Visit
Admission: RE | Admit: 2016-10-17 | Discharge: 2016-10-17 | Disposition: A | Discharge status: Home / Self Care | Payer: Medicaid Other | Source: Ambulatory Visit | Attending: Cardiology | Admitting: Cardiology

## 2016-10-17 DIAGNOSIS — Z95810 Presence of automatic (implantable) cardiac defibrillator: Secondary | ICD-10-CM

## 2016-10-17 DIAGNOSIS — R0789 Other chest pain: Secondary | ICD-10-CM

## 2016-10-19 ENCOUNTER — Ambulatory Visit: Payer: Medicaid Other | Attending: Family Medicine | Admitting: Family Medicine

## 2016-10-19 ENCOUNTER — Encounter: Payer: Self-pay | Admitting: Family Medicine

## 2016-10-19 VITALS — BP 107/67 | HR 93 | Temp 98.1°F | Ht 66.0 in | Wt 151.2 lb

## 2016-10-19 DIAGNOSIS — E1151 Type 2 diabetes mellitus with diabetic peripheral angiopathy without gangrene: Secondary | ICD-10-CM | POA: Diagnosis not present

## 2016-10-19 DIAGNOSIS — Z7982 Long term (current) use of aspirin: Secondary | ICD-10-CM | POA: Diagnosis not present

## 2016-10-19 DIAGNOSIS — E1122 Type 2 diabetes mellitus with diabetic chronic kidney disease: Secondary | ICD-10-CM | POA: Diagnosis present

## 2016-10-19 DIAGNOSIS — J029 Acute pharyngitis, unspecified: Secondary | ICD-10-CM | POA: Insufficient documentation

## 2016-10-19 DIAGNOSIS — Z79899 Other long term (current) drug therapy: Secondary | ICD-10-CM | POA: Insufficient documentation

## 2016-10-19 DIAGNOSIS — N182 Chronic kidney disease, stage 2 (mild): Secondary | ICD-10-CM | POA: Diagnosis not present

## 2016-10-19 DIAGNOSIS — Z794 Long term (current) use of insulin: Secondary | ICD-10-CM | POA: Insufficient documentation

## 2016-10-19 DIAGNOSIS — J069 Acute upper respiratory infection, unspecified: Secondary | ICD-10-CM

## 2016-10-19 DIAGNOSIS — R0981 Nasal congestion: Secondary | ICD-10-CM | POA: Insufficient documentation

## 2016-10-19 LAB — GLUCOSE, POCT (MANUAL RESULT ENTRY)
POC GLUCOSE: 496 mg/dL — AB (ref 70–99)
POC Glucose: 467 mg/dl — AB (ref 70–99)

## 2016-10-19 LAB — POCT GLYCOSYLATED HEMOGLOBIN (HGB A1C): HEMOGLOBIN A1C: 13.4

## 2016-10-19 MED ORDER — AMOXICILLIN 500 MG PO CAPS: 500.0000 mg | ORAL_CAPSULE | Freq: Three times a day (TID) | ORAL | 0 refills | Status: DC

## 2016-10-19 MED ORDER — INSULIN GLARGINE 100 UNIT/ML ~~LOC~~ SOLN: 15.0000 [IU] | Freq: Every day | SUBCUTANEOUS | 1 refills | Status: DC

## 2016-10-19 MED ORDER — INSULIN ASPART 100 UNIT/ML ~~LOC~~ SOLN
10.0000 [IU] | Freq: Once | SUBCUTANEOUS | Status: AC
  Administered 2016-10-19: 10 [IU] via SUBCUTANEOUS
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
## 2016-10-19 NOTE — Patient Instructions (Signed)
Diabetes Mellitus and Food It is important for you to manage your blood sugar (glucose) level. Your blood glucose level can be greatly affected by what you eat. Eating healthier foods in the appropriate amounts throughout the day at about the same time each day will help you control your blood glucose level. It can also help slow or prevent worsening of your diabetes mellitus. Healthy eating may even help you improve the level of your blood pressure and reach or maintain a healthy weight. General recommendations for healthful eating and cooking habits include:  Eating meals and snacks regularly. Avoid going long periods of time without eating to lose weight.  Eating a diet that consists mainly of plant-based foods, such as fruits, vegetables, nuts, legumes, and whole grains.  Using low-heat cooking methods, such as baking, instead of high-heat cooking methods, such as deep frying.  Work with your dietitian to make sure you understand how to use the Nutrition Facts information on food labels. How can food affect me? Carbohydrates Carbohydrates affect your blood glucose level more than any other type of food. Your dietitian will help you determine how many carbohydrates to eat at each meal and teach you how to count carbohydrates. Counting carbohydrates is important to keep your blood glucose at a healthy level, especially if you are using insulin or taking certain medicines for diabetes mellitus. Alcohol Alcohol can cause sudden decreases in blood glucose (hypoglycemia), especially if you use insulin or take certain medicines for diabetes mellitus. Hypoglycemia can be a life-threatening condition. Symptoms of hypoglycemia (sleepiness, dizziness, and disorientation) are similar to symptoms of having too much alcohol. If your health care provider has given you approval to drink alcohol, do so in moderation and use the following guidelines:  Women should not have more than one drink per day, and men  should not have more than two drinks per day. One drink is equal to: ? 12 oz of beer. ? 5 oz of wine. ? 1 oz of hard liquor.  Do not drink on an empty stomach.  Keep yourself hydrated. Have water, diet soda, or unsweetened iced tea.  Regular soda, juice, and other mixers might contain a lot of carbohydrates and should be counted.  What foods are not recommended? As you make food choices, it is important to remember that all foods are not the same. Some foods have fewer nutrients per serving than other foods, even though they might have the same number of calories or carbohydrates. It is difficult to get your body what it needs when you eat foods with fewer nutrients. Examples of foods that you should avoid that are high in calories and carbohydrates but low in nutrients include:  Trans fats (most processed foods list trans fats on the Nutrition Facts label).  Regular soda.  Juice.  Candy.  Sweets, such as cake, pie, doughnuts, and cookies.  Fried foods.  What foods can I eat? Eat nutrient-rich foods, which will nourish your body and keep you healthy. The food you should eat also will depend on several factors, including:  The calories you need.  The medicines you take.  Your weight.  Your blood glucose level.  Your blood pressure level.  Your cholesterol level.  You should eat a variety of foods, including:  Protein. ? Lean cuts of meat. ? Proteins low in saturated fats, such as fish, egg whites, and beans. Avoid processed meats.  Fruits and vegetables. ? Fruits and vegetables that may help control blood glucose levels, such as apples,   mangoes, and yams.  Dairy products. ? Choose fat-free or low-fat dairy products, such as milk, yogurt, and cheese.  Grains, bread, pasta, and rice. ? Choose whole grain products, such as multigrain bread, whole oats, and brown rice. These foods may help control blood pressure.  Fats. ? Foods containing healthful fats, such as  nuts, avocado, olive oil, canola oil, and fish.  Does everyone with diabetes mellitus have the same meal plan? Because every person with diabetes mellitus is different, there is not one meal plan that works for everyone. It is very important that you meet with a dietitian who will help you create a meal plan that is just right for you. This information is not intended to replace advice given to you by your health care provider. Make sure you discuss any questions you have with your health care provider. Document Released: 08/16/2005 Document Revised: 04/26/2016 Document Reviewed: 10/16/2013 [employer] Interactive Patient Education  2017 [employer]  

## 2016-10-22 ENCOUNTER — Encounter: Payer: Self-pay | Admitting: Interventional Cardiology

## 2016-10-22 ENCOUNTER — Ambulatory Visit (INDEPENDENT_AMBULATORY_CARE_PROVIDER_SITE_OTHER): Payer: Medicaid Other | Admitting: Interventional Cardiology

## 2016-10-22 VITALS — BP 114/80 | HR 84 | Ht 66.0 in | Wt 152.6 lb

## 2016-10-22 DIAGNOSIS — I25709 Atherosclerosis of coronary artery bypass graft(s), unspecified, with unspecified angina pectoris: Secondary | ICD-10-CM

## 2016-10-22 DIAGNOSIS — I255 Ischemic cardiomyopathy: Secondary | ICD-10-CM | POA: Diagnosis not present

## 2016-10-22 DIAGNOSIS — E1152 Type 2 diabetes mellitus with diabetic peripheral angiopathy with gangrene: Secondary | ICD-10-CM | POA: Diagnosis not present

## 2016-10-22 DIAGNOSIS — I5022 Chronic systolic (congestive) heart failure: Secondary | ICD-10-CM | POA: Diagnosis not present

## 2016-10-22 DIAGNOSIS — I63232 Cerebral infarction due to unspecified occlusion or stenosis of left carotid arteries: Secondary | ICD-10-CM

## 2016-10-22 DIAGNOSIS — Z95810 Presence of automatic (implantable) cardiac defibrillator: Secondary | ICD-10-CM

## 2016-10-22 DIAGNOSIS — Q240 Dextrocardia: Secondary | ICD-10-CM

## 2016-10-22 LAB — COMPREHENSIVE METABOLIC PANEL
ALK PHOS: 98 U/L (ref 40–115)
ALT: 7 U/L — AB (ref 9–46)
AST: 10 U/L (ref 10–35)
Albumin: 3.1 g/dL — ABNORMAL LOW (ref 3.6–5.1)
BUN: 26 mg/dL — ABNORMAL HIGH (ref 7–25)
CALCIUM: 8.6 mg/dL (ref 8.6–10.3)
CO2: 28 mmol/L (ref 20–31)
Chloride: 96 mmol/L — ABNORMAL LOW (ref 98–110)
Creat: 0.99 mg/dL (ref 0.70–1.33)
Glucose, Bld: 386 mg/dL — ABNORMAL HIGH (ref 65–99)
POTASSIUM: 4.3 mmol/L (ref 3.5–5.3)
Sodium: 132 mmol/L — ABNORMAL LOW (ref 135–146)
TOTAL PROTEIN: 6.9 g/dL (ref 6.1–8.1)
Total Bilirubin: 0.3 mg/dL (ref 0.2–1.2)

## 2016-10-22 MED ORDER — SACUBITRIL-VALSARTAN 24-26 MG PO TABS: 1.0000 | ORAL_TABLET | Freq: Two times a day (BID) | ORAL | Status: DC
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
## 2016-10-22 NOTE — Patient Instructions (Signed)
Medication Instructions:  1) DISCONTINUE Lisinopril 2) START Entresto 24/26mg  on Wednesday morning.  Labwork: CMET and BNP today  Testing/Procedures: None  Follow-Up: Your physician recommends that you schedule a follow-up appointment in: 3 weeks with Dr. Katrinka Blazing or an APP for titration of Entresto.  Your physician recommends that you schedule an appointment before the end of the year with the CHF Clinic to get established with them.     Any Other Special Instructions Will Be Listed Below (If Applicable).     If you need a refill on your cardiac medications before your next appointment, please call your pharmacy.

## 2016-10-23 LAB — BRAIN NATRIURETIC PEPTIDE: BRAIN NATRIURETIC PEPTIDE: 279.4 pg/mL — AB (ref <100)

## 2016-10-29 ENCOUNTER — Encounter: Payer: Self-pay | Admitting: Internal Medicine

## 2016-10-29 ENCOUNTER — Other Ambulatory Visit: Payer: Self-pay

## 2016-10-29 ENCOUNTER — Ambulatory Visit: Payer: Medicaid Other | Attending: Internal Medicine | Admitting: Internal Medicine

## 2016-10-29 ENCOUNTER — Telehealth: Payer: Self-pay | Admitting: Internal Medicine

## 2016-10-29 VITALS — BP 108/73 | HR 80 | Temp 97.9°F | Resp 16 | Wt 150.4 lb

## 2016-10-29 DIAGNOSIS — I4891 Unspecified atrial fibrillation: Secondary | ICD-10-CM | POA: Insufficient documentation

## 2016-10-29 DIAGNOSIS — I2510 Atherosclerotic heart disease of native coronary artery without angina pectoris: Secondary | ICD-10-CM | POA: Insufficient documentation

## 2016-10-29 DIAGNOSIS — Z955 Presence of coronary angioplasty implant and graft: Secondary | ICD-10-CM | POA: Diagnosis not present

## 2016-10-29 DIAGNOSIS — Z95810 Presence of automatic (implantable) cardiac defibrillator: Secondary | ICD-10-CM | POA: Insufficient documentation

## 2016-10-29 DIAGNOSIS — Z7902 Long term (current) use of antithrombotics/antiplatelets: Secondary | ICD-10-CM | POA: Insufficient documentation

## 2016-10-29 DIAGNOSIS — Z8673 Personal history of transient ischemic attack (TIA), and cerebral infarction without residual deficits: Secondary | ICD-10-CM | POA: Diagnosis not present

## 2016-10-29 DIAGNOSIS — Z79899 Other long term (current) drug therapy: Secondary | ICD-10-CM | POA: Insufficient documentation

## 2016-10-29 DIAGNOSIS — I5022 Chronic systolic (congestive) heart failure: Secondary | ICD-10-CM | POA: Diagnosis not present

## 2016-10-29 DIAGNOSIS — E1151 Type 2 diabetes mellitus with diabetic peripheral angiopathy without gangrene: Secondary | ICD-10-CM

## 2016-10-29 DIAGNOSIS — E1152 Type 2 diabetes mellitus with diabetic peripheral angiopathy with gangrene: Secondary | ICD-10-CM | POA: Insufficient documentation

## 2016-10-29 DIAGNOSIS — Z23 Encounter for immunization: Secondary | ICD-10-CM | POA: Diagnosis not present

## 2016-10-29 DIAGNOSIS — Z7982 Long term (current) use of aspirin: Secondary | ICD-10-CM | POA: Diagnosis not present

## 2016-10-29 DIAGNOSIS — I130 Hypertensive heart and chronic kidney disease with heart failure and stage 1 through stage 4 chronic kidney disease, or unspecified chronic kidney disease: Secondary | ICD-10-CM | POA: Diagnosis not present

## 2016-10-29 DIAGNOSIS — Z794 Long term (current) use of insulin: Secondary | ICD-10-CM | POA: Insufficient documentation

## 2016-10-29 DIAGNOSIS — Q240 Dextrocardia: Secondary | ICD-10-CM | POA: Diagnosis not present

## 2016-10-29 DIAGNOSIS — E1122 Type 2 diabetes mellitus with diabetic chronic kidney disease: Secondary | ICD-10-CM | POA: Diagnosis not present

## 2016-10-29 DIAGNOSIS — Z951 Presence of aortocoronary bypass graft: Secondary | ICD-10-CM | POA: Diagnosis not present

## 2016-10-29 DIAGNOSIS — Z1211 Encounter for screening for malignant neoplasm of colon: Secondary | ICD-10-CM

## 2016-10-29 LAB — GLUCOSE, POCT (MANUAL RESULT ENTRY)
POC GLUCOSE: 320 mg/dL — AB (ref 70–99)
POC Glucose: 310 mg/dl — AB (ref 70–99)

## 2016-10-29 MED ORDER — GLUCOSE BLOOD VI STRP: ORAL_STRIP | 12 refills | Status: AC

## 2016-10-29 MED ORDER — INSULIN ASPART 100 UNIT/ML ~~LOC~~ SOLN
10.0000 [IU] | Freq: Once | SUBCUTANEOUS | Status: AC
  Administered 2016-10-29: 10 [IU] via SUBCUTANEOUS

## 2016-10-29 MED ORDER — INSULIN GLARGINE 100 UNIT/ML ~~LOC~~ SOLN: 13.0000 [IU] | Freq: Every day | SUBCUTANEOUS | 3 refills | Status: DC

## 2016-10-29 MED ORDER — TRUEPLUS LANCETS 28G MISC: 12 refills | Status: AC

## 2016-10-29 NOTE — Telephone Encounter (Signed)
Patient called the office to speak with PCP regarding his meter. Pt couldn't remember the name of it during appt, but he got home and checked and it's [employer]. Please call prescription to [facility] on [address].   Thank you.

## 2016-10-29 NOTE — Patient Instructions (Addendum)
- fu [facility] Tran [facility] 2 wks /dm chk.   Check blood sugars on waking up daily., and at least 1-2 times rest of day.    Also check blood sugars about 2 hours after a meal and do this after different meals by rotation  Recommended blood sugar levels on waking up is 90-130 and about 2 hours after meal is 130-160  Please bring your blood sugar monitor to each visit, thank you  Restart exercise  Continue cholesterol med.   - Lantus Titration  Instructions:  IF AM blood sugar is  >150, increase lantus by 1 unit at night.  Continue to check sugars daily. On day 3, if blood sugar >150, continue to increase lantus by 1 unit.  If blood sugar < 70s, than need to reduce dose by 1 unit nightly.  Continue to check blood sugars in am daily and adjust per above.  -- please call us if you have any questions   - Diabetes Mellitus and Food It is important for you to manage your blood sugar (glucose) level. Your blood glucose level can be greatly affected by what you eat. Eating healthier foods in the appropriate amounts throughout the day at about the same time each day will help you control your blood glucose level. It can also help slow or prevent worsening of your diabetes mellitus. Healthy eating may even help you improve the level of your blood pressure and reach or maintain a healthy weight. General recommendations for healthful eating and cooking habits include:  Eating meals and snacks regularly. Avoid going long periods of time without eating to lose weight.  Eating a diet that consists mainly of plant-based foods, such as fruits, vegetables, nuts, legumes, and whole grains.  Using low-heat cooking methods, such as baking, instead of high-heat cooking methods, such as deep frying. Work with your dietitian to make sure you understand how to use the Nutrition Facts information on food labels. How can food affect me? Carbohydrates  Carbohydrates affect your blood glucose level more than  any other type of food. Your dietitian will help you determine how many carbohydrates to eat at each meal and teach you how to count carbohydrates. Counting carbohydrates is important to keep your blood glucose at a healthy level, especially if you are using insulin or taking certain medicines for diabetes mellitus. Alcohol  Alcohol can cause sudden decreases in blood glucose (hypoglycemia), especially if you use insulin or take certain medicines for diabetes mellitus. Hypoglycemia can be a life-threatening condition. Symptoms of hypoglycemia (sleepiness, dizziness, and disorientation) are similar to symptoms of having too much alcohol. If your health care provider has given you approval to drink alcohol, do so in moderation and use the following guidelines:  Women should not have more than one drink per day, and men should not have more than two drinks per day. One drink is equal to:  12 oz of beer.  5 oz of wine.  1 oz of hard liquor.  Do not drink on an empty stomach.  Keep yourself hydrated. Have water, diet soda, or unsweetened iced tea.  Regular soda, juice, and other mixers might contain a lot of carbohydrates and should be counted. What foods are not recommended? As you make food choices, it is important to remember that all foods are not the same. Some foods have fewer nutrients per serving than other foods, even though they might have the same number of calories or carbohydrates. It is difficult to get your body what it  needs when you eat foods with fewer nutrients. Examples of foods that you should avoid that are high in calories and carbohydrates but low in nutrients include:  Trans fats (most processed foods list trans fats on the Nutrition Facts label).  Regular soda.  Juice.  Candy.  Sweets, such as cake, pie, doughnuts, and cookies.  Fried foods. What foods can I eat? Eat nutrient-rich foods, which will nourish your body and keep you healthy. The food you should eat  also will depend on several factors, including:  The calories you need.  The medicines you take.  Your weight.  Your blood glucose level.  Your blood pressure level.  Your cholesterol level. You should eat a variety of foods, including:  Protein.  Lean cuts of meat.  Proteins low in saturated fats, such as fish, egg whites, and beans. Avoid processed meats.  Fruits and vegetables.  Fruits and vegetables that may help control blood glucose levels, such as apples, mangoes, and yams.  Dairy products.  Choose fat-free or low-fat dairy products, such as milk, yogurt, and cheese.  Grains, bread, pasta, and rice.  Choose whole grain products, such as multigrain bread, whole oats, and brown rice. These foods may help control blood pressure.  Fats.  Foods containing healthful fats, such as nuts, avocado, olive oil, canola oil, and fish. Does everyone with diabetes mellitus have the same meal plan? Because every person with diabetes mellitus is different, there is not one meal plan that works for everyone. It is very important that you meet with a dietitian who will help you create a meal plan that is just right for you. This information is not intended to replace advice given to you by your health care provider. Make sure you discuss any questions you have with your health care provider. Document Released: 08/16/2005 Document Revised: 04/26/2016 Document Reviewed: 10/16/2013 Elsevier Interactive Patient Education  2017 Elsevier Inc.   Pneumococcal Polysaccharide Vaccine: What You Need to Know 1. Why get vaccinated? Vaccination can protect older adults (and some children and younger adults) from pneumococcal disease. Pneumococcal disease is caused by bacteria that can spread from person to person through close contact. It can cause ear infections, and it can also lead to more serious infections of the:  Lungs (pneumonia),  Blood (bacteremia), and  Covering of the brain and  spinal cord (meningitis). Meningitis can cause deafness and brain damage, and it can be fatal. Anyone can get pneumococcal disease, but children under 66 years of age, people with certain medical conditions, adults over 72 years of age, and cigarette smokers are at the highest risk. About 18,000 older adults die each year from pneumococcal disease in the [place]. Treatment of pneumococcal infections with penicillin and other drugs used to be more effective. But some strains of the disease have become resistant to these drugs. This makes prevention of the disease, through vaccination, even more important. 2. Pneumococcal polysaccharide vaccine (PPSV23) Pneumococcal polysaccharide vaccine (PPSV23) protects against 23 types of pneumococcal bacteria. It will not prevent all pneumococcal disease. PPSV23 is recommended for:  All adults 84 years of age and older,  Anyone 2 through 58 years of age with certain long-term health problems,  Anyone 2 through 58 years of age with a weakened immune system,  Adults 63 through 58 years of age who smoke cigarettes or have asthma. Most people need only one dose of PPSV. A second dose is recommended for certain high-risk groups. People 30 and older should get a dose even  if they have gotten one or more doses of the vaccine before they turned 65. Your healthcare provider can give you more information about these recommendations. Most healthy adults develop protection within 2 to 3 weeks of getting the shot. 3. Some people should not get this vaccine  Anyone who has had a life-threatening allergic reaction to PPSV should not get another dose.  Anyone who has a severe allergy to any component of PPSV should not receive it. Tell your provider if you have any severe allergies.  Anyone who is moderately or severely ill when the shot is scheduled may be asked to wait until they recover before getting the vaccine. Someone with a mild illness can usually be  vaccinated.  Children less than 18 years of age should not receive this vaccine.  There is no evidence that PPSV is harmful to either a pregnant woman or to her fetus. However, as a precaution, women who need the vaccine should be vaccinated before becoming pregnant, if possible. 4. Risks of a vaccine reaction With any medicine, including vaccines, there is a chance of side effects. These are usually mild and go away on their own, but serious reactions are also possible. About half of people who get PPSV have mild side effects, such as redness or pain where the shot is given, which go away within about two days. Less than 1 out of 100 people develop a fever, muscle aches, or more severe local reactions. Problems that could happen after any vaccine:  People sometimes faint after a medical procedure, including vaccination. Sitting or lying down for about 15 minutes can help prevent fainting, and injuries caused by a fall. Tell your doctor if you feel dizzy, or have vision changes or ringing in the ears.  Some people get severe pain in the shoulder and have difficulty moving the arm where a shot was given. This happens very rarely.  Any medication can cause a severe allergic reaction. Such reactions from a vaccine are very rare, estimated at about 1 in a million doses, and would happen within a few minutes to a few hours after the vaccination. As with any medicine, there is a very remote chance of a vaccine causing a serious injury or death. The safety of vaccines is always being monitored. For more information, visit: http://floyd.org/ 5. What if there is a serious reaction? What should I look for? Look for anything that concerns you, such as signs of a severe allergic reaction, very high fever, or unusual behavior. Signs of a severe allergic reaction can include hives, swelling of the face and throat, difficulty breathing, a fast heartbeat, dizziness, and weakness. These would usually  start a few minutes to a few hours after the vaccination. What should I do? If you think it is a severe allergic reaction or other emergency that can't wait, call 9-1-1 or get to the nearest hospital. Otherwise, call your doctor. Afterward, the reaction should be reported to the Vaccine Adverse Event Reporting System (VAERS). Your doctor might file this report, or you can do it yourself through the VAERS web site at www.vaers.LAgents.no, or by calling 1-(510)856-4834. VAERS does not give medical advice. 6. How can I learn more?  Ask your doctor. He or she can give you the vaccine package insert or suggest other sources of information.  Call your local or state health department.  Contact the Centers for Disease Control and Prevention (CDC):  Call 8174376282 (1-800-CDC-INFO) or  Visit CDC's website at PicCapture.uy CDC Pneumococcal Polysaccharide  Vaccine VIS (03/26/14) This information is not intended to replace advice given to you by your health care provider. Make sure you discuss any questions you have with your health care provider. Document Released: 09/16/2006 Document Revised: 08/09/2016 Document Reviewed: 08/09/2016 Elsevier Interactive Patient Education  2017 Elsevier Inc. Influenza Virus Vaccine injection (Fluarix) What is this medicine? INFLUENZA VIRUS VACCINE (in floo EN zuh VAHY ruhs vak SEEN) helps to reduce the risk of getting influenza also known as the flu. This medicine may be used for other purposes; ask your health care provider or pharmacist if you have questions. COMMON BRAND NAME(S): Fluarix, Fluzone What should I tell my health care provider before I take this medicine? They need to know if you have any of these conditions: -bleeding disorder like hemophilia -fever or infection -Guillain-Barre syndrome or other neurological problems -immune system problems -infection with the human immunodeficiency virus (HIV) or AIDS -low blood platelet counts -multiple  sclerosis -an unusual or allergic reaction to influenza virus vaccine, eggs, chicken proteins, latex, gentamicin, other medicines, foods, dyes or preservatives -pregnant or trying to get pregnant -breast-feeding How should I use this medicine? This vaccine is for injection into a muscle. It is given by a health care professional. A copy of Vaccine Information Statements will be given before each vaccination. Read this sheet carefully each time. The sheet may change frequently. Talk to your pediatrician regarding the use of this medicine in children. Special care may be needed. Overdosage: If you think you have taken too much of this medicine contact a poison control center or emergency room at once. NOTE: This medicine is only for you. Do not share this medicine with others. What if I miss a dose? This does not apply. What may interact with this medicine? -chemotherapy or radiation therapy -medicines that lower your immune system like etanercept, anakinra, infliximab, and adalimumab -medicines that treat or prevent blood clots like warfarin -phenytoin -steroid medicines like prednisone or cortisone -theophylline -vaccines This list may not describe all possible interactions. Give your health care provider a list of all the medicines, herbs, non-prescription drugs, or dietary supplements you use. Also tell them if you smoke, drink alcohol, or use illegal drugs. Some items may interact with your medicine. What should I watch for while using this medicine? Report any side effects that do not go away within 3 days to your doctor or health care professional. Call your health care provider if any unusual symptoms occur within 6 weeks of receiving this vaccine. You may still catch the flu, but the illness is not usually as bad. You cannot get the flu from the vaccine. The vaccine will not protect against colds or other illnesses that may cause fever. The vaccine is needed every year. What side  effects may I notice from receiving this medicine? Side effects that you should report to your doctor or health care professional as soon as possible: -allergic reactions like skin rash, itching or hives, swelling of the face, lips, or tongue Side effects that usually do not require medical attention (report to your doctor or health care professional if they continue or are bothersome): -fever -headache -muscle aches and pains -pain, tenderness, redness, or swelling at site where injected -weak or tired This list may not describe all possible side effects. Call your doctor for medical advice about side effects. You may report side effects to FDA at 1-800-FDA-1088. Where should I keep my medicine? This vaccine is only given in a clinic, pharmacy, doctor's office, or other  health care setting and will not be stored at home. NOTE: This sheet is a summary. It may not cover all possible information. If you have questions about this medicine, talk to your doctor, pharmacist, or health care provider.  2017 Elsevier/Gold Standard (2008-06-16 09:30:40) -

## 2016-10-29 NOTE — Progress Notes (Unsigned)
[id number] 

## 2016-10-29 NOTE — Progress Notes (Signed)
Travis Tran, is a 60 y.o. male  IRC:[id number]  PZW:[id number]  DOB - Dec 05, 1955  Chief Complaint  Patient presents with  . Diabetes        Subjective:   Long Brimage is a 60 y.o. male here today for a follow up visit for dm, last seen in clinic 10/19/16 and insulin adjusted at time. 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  Pt recently saw cards, Dr Katrinka Blazing, was changed from lisinopril to [facility].  Pt o/w doing well. Notes that he sleeps very late at night, typically around 2am, takes his lantus around MN and typically wakes up around noon time.  He admits to falling asleep last night and fogot to take his lantus. Has only been taking lantus 12 units at night, feels "shaky" in am when takes higher dose.  Typically takes Novolog about 5-7units 2x day (only eats 2x day most times).  Checks his cbg only occasionally, but notes typically when wakes up ranging 150-200s.  Patient has No headache, No chest pain, No abdominal pain - No Nausea, No new weakness tingling or numbness, No Cough - SOB.  No problems updated.  ALLERGIES: Allergies  Allergen Reactions  . Celecoxib Swelling  . Sulfa Antibiotics Other (See Comments)    unknown    PAST MEDICAL HISTORY: Past Medical History:  Diagnosis Date  . Anxiety   . Asthma   . CAD, multiple vessel   . CHF (congestive heart failure) (HCC)    [name] 03/20/2016  . Chronic kidney disease (CKD), stage II (mild)    [name] 03/20/2016  . Claustrophobia   . GERD (gastroesophageal reflux disease)    "once in awhile"  . High cholesterol   . Hypertension   . Psoriatic arthritis (HCC)   . Rheumatoid arthritis (HCC)   . S/P CABG x 5    [facility] RIMA-LAD, SVG-OM, SVG-DIAG, seqSVG-RPDA-dRCA   . 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 Stroke (HCC)   . TIA  (transient ischemic attack) ?03/17/2016  . Type II diabetes mellitus (HCC)     MEDICATIONS AT HOME: Prior to Admission medications   Medication Sig Start Date End Date Taking? Authorizing Provider  amoxicillin (AMOXIL) 500 MG capsule Take 1 capsule (500 mg total) by mouth 3 (three) times daily. Patient not taking: Reported on 10/29/2016 10/19/16   Jaclyn Shaggy, MD  aspirin 81 MG tablet Take 1 tablet (81 mg total) by mouth daily with breakfast. 06/06/16   Lyn Records, MD  atorvastatin (LIPITOR) 80 MG tablet Take 1 tablet (80 mg total) by mouth daily at 6 PM. 03/04/16   Su Hoff, MD  budesonide-formoterol (SYMBICORT) 80-4.5 MCG/ACT inhaler Inhale 2 puffs into the lungs 2 (two) times daily. 05/25/16   Quentin Angst, MD  carvedilol (COREG) 6.25 MG tablet Take 1 tablet (6.25 mg total) by mouth 2 (two) times daily. 10/16/16   [name], MD  clopidogrel (PLAVIX) 75 MG tablet Take 1 tablet (75 mg total) by mouth daily. 09/19/16   Marvel Plan, MD  furosemide (LASIX) 40 MG tablet Take 1 tablet (40 mg total) by mouth 2 (two) times daily. 03/04/16   Carly Arlyce Harman, MD  insulin aspart (NOVOLOG) 100 UNIT/ML injection Inject 5-7 Units into the skin 3 (three) times daily with meals. 07/11/16   Renee Norberto Sorenson, PA-C  insulin glargine (LANTUS) 100 UNIT/ML injection Inject 0.13 mLs (13 Units total) into the skin at bedtime. 10/29/16   Pete Glatter, MD  pantoprazole (PROTONIX) 40 MG tablet Take 1 tablet (40 mg total) by mouth daily. 03/04/16   Su Hoff, MD  spironolactone (ALDACTONE) 25 MG tablet 1/2 tablet (12.5mg ) by mouth on Mondays/Wednesdays/Fridays 07/31/16   Lyn Records, MD  traMADol (ULTRAM) 50 MG tablet Take 1 tablet (50 mg total) by mouth every 6 (six) hours as needed for moderate pain. 07/11/16   Renee Norberto Sorenson, PA-C     Objective:   Vitals:   10/29/16 0916  BP: 108/73  Pulse: 80  Resp: 16  Temp: 97.9 F (36.6 C)  TempSrc: Oral  SpO2: 96%  Weight: 150 lb 6.4 oz (68.2 kg)      Exam General appearance : Awake, alert, not in any distress. Speech Clear. Not toxic looking, pleasant HEENT: Atraumatic and Normocephalic, pupils equally reactive to light. Neck: supple, no JVD.  Chest:Good air entry bilaterally, no added sounds.  [facility] dextrocardia) CVS: S1 S2 regular, no murmurs/gallups or rubs. Abdomen: Bowel sounds active, Non tender and not distended with no gaurding, rigidity or rebound. Foot exam: bilateral peripheral pulses 2+ (dorsalis pedis and post tibialis pulses), no ulcers noted/no ecchymosis, warm to touch, monofilament testing 3/3 bilat. Sensation intact.  No c/c/e. Neurology: Awake alert, and oriented X 3, CN II-XII grossly intact, Non focal Skin:No Rash  Data Review Lab Results  Component Value Date   HGBA1C 13.4 10/19/2016   HGBA1C 15.1 (H) 02/27/2016    Depression screen [facility] 2/9 10/29/2016 10/19/2016 03/06/2016  Decreased Interest 2 2 1   Down, Depressed, Hopeless 2 0 0  PHQ - 2 Score 4 2 1   Altered sleeping 2 0 1  Tired, decreased energy 2 3 3   Change in appetite 2 3 2   Feeling bad or failure about yourself  1 1 0  Trouble concentrating 1 0 0  Moving slowly or fidgety/restless 1 0 0  Suicidal thoughts 0 0 0  PHQ-9 Score 13 9 7       Assessment & Plan   1. Type 2 diabetes mellitus with diabetic peripheral angiopathy with gangrene, unspecified long term insulin use status (HCC) - ooc, needs tighter control. - POCT glucose (manual entry)  310, repeat 320 - insulin aspart (novoLOG) injection 10 Units; Inject 0.1 mLs (10 Units total) into the skin once. - Ambulatory referral to Ophthalmology - POCT glucose (manual entry) - insulin aspart (novoLOG) injection 10 Units; Inject 0.1 mLs (10 Units total) into the skin once. - recd takes lantus prior to bedtime (around 2am), recd to chk his am blood cbg when wakes up, around Noon per pt. - insulin glargine (LANTUS) 100 UNIT/ML injection; Inject 0.13 mLs (13 Units total) into the skin at  bedtime.  Dispense: 10 mL; Refill: 3 - increase lantus to 13units at night, instructed pt on self titration every 3 days, goal am cbg 80-140s,  - will send refill supplies when he finds out what glucometer he has - f/u pharm clinic 2 wks for dm chk.  3. Colon cancer screening - Ambulatory referral to Gastroenterology  4. Encounter for immunization - Flu Vaccine QUAD 36+ mos IM - pneumococcal 23v today  5. Hx of ICM, sp AICD - per cards  6. Chronic systolic chf,  Stable, euvolenmic today. Per cards  7. Known situs inversus.   Patient have been counseled extensively about nutrition and exercise  Return in about  4 weeks (around 11/26/2016) for dm .  The patient was given clear instructions to go to ER or return to medical center if symptoms don't improve, worsen or new problems develop. The patient verbalized understanding. The patient was told to call to get lab results if they haven't heard anything in the next week.   This note has been created with Education officer, environmental. Any transcriptional errors are unintentional.   Pete Glatter, MD, MBA/MHA [facility] and [facility] [place], [place] [phone]   10/29/2016, 12:45 PM

## 2016-10-30 ENCOUNTER — Encounter (HOSPITAL_COMMUNITY): Payer: Self-pay

## 2016-10-30 ENCOUNTER — Ambulatory Visit (HOSPITAL_COMMUNITY)
Admission: RE | Admit: 2016-10-30 | Discharge: 2016-10-30 | Disposition: A | Discharge status: Home / Self Care | Payer: Medicaid Other | Source: Ambulatory Visit | Attending: Interventional Radiology | Admitting: Interventional Radiology

## 2016-10-30 DIAGNOSIS — I6502 Occlusion and stenosis of left vertebral artery: Secondary | ICD-10-CM | POA: Diagnosis not present

## 2016-10-30 DIAGNOSIS — I771 Stricture of artery: Secondary | ICD-10-CM | POA: Diagnosis not present

## 2016-10-30 DIAGNOSIS — I6523 Occlusion and stenosis of bilateral carotid arteries: Secondary | ICD-10-CM | POA: Insufficient documentation

## 2016-10-30 MED ORDER — IOPAMIDOL (ISOVUE-370) INJECTION 76%
INTRAVENOUS | Status: AC
  Filled 2016-10-30: qty 50

## 2016-10-31 ENCOUNTER — Other Ambulatory Visit: Payer: Self-pay | Admitting: *Deleted

## 2016-10-31 NOTE — Telephone Encounter (Signed)
Pt called to inform that True Metrix was not covered by insurance. Pt aware and stated that he spoke pharmacy yesterday and brand was changed to AccuChek. Guy Franco, RN

## 2016-11-01 ENCOUNTER — Telehealth (HOSPITAL_COMMUNITY): Payer: Self-pay

## 2016-11-01 NOTE — Telephone Encounter (Signed)
Pt agreed to f/u in 6 months with cta. AW  

## 2016-11-07 ENCOUNTER — Encounter: Payer: Self-pay | Admitting: Nurse Practitioner

## 2016-11-12 ENCOUNTER — Ambulatory Visit: Payer: Medicaid Other | Admitting: Nurse Practitioner
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
## 2016-11-15 ENCOUNTER — Encounter: Payer: Self-pay | Admitting: Nurse Practitioner

## 2016-11-16 ENCOUNTER — Ambulatory Visit (INDEPENDENT_AMBULATORY_CARE_PROVIDER_SITE_OTHER): Payer: Medicaid Other

## 2016-11-16 DIAGNOSIS — Z95810 Presence of automatic (implantable) cardiac defibrillator: Secondary | ICD-10-CM | POA: Diagnosis not present

## 2016-11-16 DIAGNOSIS — I5022 Chronic systolic (congestive) heart failure: Secondary | ICD-10-CM | POA: Diagnosis not present

## 2016-11-16 NOTE — Telephone Encounter (Signed)
Cologuard order has been suspended due to inactivity.

## 2016-11-16 NOTE — Progress Notes (Signed)
EPIC Encounter for ICM Monitoring  Patient Name: Travis Tran is a 60 y.o. male Date: 11/16/2016 Primary Care Physican: Maren Reamer, MD Primary Cardiologist: Tamala Julian Electrophysiologist: Curt Bears Dry Weight: 148 lbs       1st ICM encounter.    Heart Failure questions reviewed, pt asymptomatic   Thoracic impedance abnormal suggesting fluid accumulation since 09/16/2016.  Labs: 10/22/2016 Creatinine 0.99, BUN 26, Potassium 4.3, Sodium 132 07/27/2016 Creatinine 1.04, BUN 14, Potassium 3.5, Sodium 135, EGFR >60  07/05/2016 Creatinine 1.01, BUN 25, Potassium 3.9, Sodium 139  05/17/2016 Creatinine 1.03, BUN 27, Potassium 3.8, Sodium 132  03/24/2016 Creatinine 0.88, BUN 16, Potassium 4.1, Sodium 138   Recommendations: Increase Furosemide 40 mg to 2 tablets in am and 1 tablet in pm x 3 days only and then return to prior dosage of 1 tablet twice a day.  Reinforced to limit low salt food choices to 2000 mg day and limiting fluid intake to < 2 liters per day. Encouraged to call for fluid symptoms.    Follow-up plan: ICM clinic phone appointment on 11/20/2016 to recheck fluid levels.  Copy of ICM check sent to primary cardiologist and device physician.   ICM trend: 11/16/2016       Travis Billings, RN 11/16/2016 2:20 PM

## 2016-11-17 NOTE — Progress Notes (Signed)
Has patient been given these instructions? Note seems to suggest he has.

## 2016-11-20 ENCOUNTER — Ambulatory Visit: Payer: Medicaid Other | Admitting: Pharmacist

## 2016-11-20 ENCOUNTER — Ambulatory Visit (INDEPENDENT_AMBULATORY_CARE_PROVIDER_SITE_OTHER): Payer: Medicaid Other

## 2016-11-20 DIAGNOSIS — I5022 Chronic systolic (congestive) heart failure: Secondary | ICD-10-CM

## 2016-11-20 DIAGNOSIS — Z95810 Presence of automatic (implantable) cardiac defibrillator: Secondary | ICD-10-CM

## 2016-11-20 NOTE — Progress Notes (Deleted)
    S:    Patient arrives in good spirits.  Presents for diabetes evaluation, education, and management at the request of Dr. Julien Nordmann. Patient was referred on 10/29/16.  Patient was last seen by Primary Care Provider on 10/29/16.   Patient {Actions; denies-reports:120008} adherence with medications.  Current diabetes medications include: Lantus 13 units daily and Novolog 5-7 units before meals (typically BID) Current hypertension medications include:   Patient {Actions; denies-reports:120008} hypoglycemic events.  Patient reported dietary habits: Eats *** meals/day Breakfast:*** Lunch:*** Dinner:*** Snacks:*** Drinks:***  Patient reported exercise habits:    Patient {Actions; denies-reports:120008} nocturia.  Patient {Actions; denies-reports:120008} neuropathy. Patient {Actions; denies-reports:120008} visual changes. Patient {Actions; denies-reports:120008} self foot exams.   O:  Lab Results  Component Value Date   HGBA1C 13.4 10/19/2016   There were no vitals filed for this visit.  Home fasting CBG: ***  2 hour post-prandial/random CBG: ***.   A/P: Diabetes longstanding currently UNcontrolled based on A1c of 13.4. Patient {Actions; denies-reports:120008} hypoglycemic events and is able to verbalize appropriate hypoglycemia management plan. Patient {Actions; denies-reports:120008} adherence with medication. Control is suboptimal due to ***.   Next A1C anticipated February 2018.    Written patient instructions provided.  Total time in face to face counseling *** minutes.   Follow up in Pharmacist Clinic Visit ***.

## 2016-11-20 NOTE — Progress Notes (Signed)
EPIC Encounter for ICM Monitoring  Patient Name: Travis Tran is a 60 y.o. male Date: 11/20/2016 Primary Care Physican: Maren Reamer, MD Primary Cardiologist: Tamala Julian Electrophysiologist: Curt Bears Dry Weight:    148 lbs                                             Heart Failure questions reviewed, pt asymptomatic   Thoracic impedance returned to normal after taking 1 additional Furosemide 40 mg x 3 day.  Fluid index remains > threshold  Labs: 10/22/2016 Creatinine 0.99, BUN 26, Potassium 4.3, Sodium 132 07/27/2016 Creatinine 1.04, BUN 14, Potassium 3.5, Sodium 135, EGFR >60  07/05/2016 Creatinine 1.01, BUN 25, Potassium 3.9, Sodium 139  05/17/2016 Creatinine 1.03, BUN 27, Potassium 3.8, Sodium 132  03/24/2016 Creatinine 0.88, BUN 16, Potassium 4.1, Sodium 138   Recommendations:   No changes.  Reinforced to limit low salt food choices to 2000 mg day and limiting fluid intake to < 2 liters per day. Encouraged to call for fluid symptoms.    Follow-up plan: ICM clinic phone appointment on 12/17/2016.  HF clinic appointment on 11/27/2016  Copy of ICM check sent to primary cardiologist and device physician for updated transmission showing impedance has returned to baseline.   ICM trend: 11/20/2016       Rosalene Billings, RN 11/20/2016 3:06 PM

## 2016-11-20 NOTE — Progress Notes (Addendum)
Message sent to Dr Katrinka Blazing stating instructions have been given per ICM protocol.

## 2016-11-27 ENCOUNTER — Encounter (HOSPITAL_COMMUNITY): Payer: Medicaid Other | Admitting: Internal Medicine

## 2016-12-17 ENCOUNTER — Ambulatory Visit (INDEPENDENT_AMBULATORY_CARE_PROVIDER_SITE_OTHER): Payer: Medicaid Other

## 2016-12-17 DIAGNOSIS — I5022 Chronic systolic (congestive) heart failure: Secondary | ICD-10-CM | POA: Diagnosis not present

## 2016-12-17 DIAGNOSIS — Z95810 Presence of automatic (implantable) cardiac defibrillator: Secondary | ICD-10-CM

## 2016-12-17 NOTE — Progress Notes (Signed)
EPIC Encounter for ICM Monitoring  Patient Name: Travis Tran is a 61 y.o. male Date: 12/17/2016 Primary Care Physican: Maren Reamer, MD Primary Cardiologist:Smith Electrophysiologist: Camnitz Dry Weight:145 lbs              Heart Failure questions reviewed, pt asymptomatic.  He reported feeling fine and denied any fluid symptoms.  He reported when he develops fluids it tends to cause feet swelling first but none today.    Thoracic impedance almost to baseline today but has been trending abnormal for majority of time since 10/2016.  Fluid index remains > threshold.  Labs: 10/22/2016 Creatinine 0.99, BUN 26, Potassium 4.3, Sodium 132 07/27/2016 Creatinine 1.04, BUN 14, Potassium 3.5, Sodium 135, EGFR >60  07/05/2016 Creatinine 1.01, BUN 25, Potassium 3.9, Sodium 139  05/17/2016 Creatinine 1.03, BUN 27, Potassium 3.8, Sodium 132  03/24/2016 Creatinine 0.88, BUN 16, Potassium 4.1, Sodium 138    Recommendations:  None- Discussed limiting dietary salt intake to 2000 mg/day and fluid intake to < 2 liters per day. Encouraged to call for fluid symptoms.  Follow-up plan: ICM clinic phone appointment on 01/02/2017 to recheck fluid levels.  Copy of ICM check sent to primary cardiologist and device physician.   3 month ICM trend: 12/17/2016   1 Year ICM trend:     Rosalene Billings, RN 12/17/2016 8:44 AM

## 2016-12-25 ENCOUNTER — Telehealth (HOSPITAL_COMMUNITY): Payer: Self-pay | Admitting: Internal Medicine

## 2016-12-25 ENCOUNTER — Other Ambulatory Visit: Payer: Self-pay | Admitting: Pediatrics

## 2016-12-25 DIAGNOSIS — I25119 Atherosclerotic heart disease of native coronary artery with unspecified angina pectoris: Secondary | ICD-10-CM

## 2016-12-25 DIAGNOSIS — I509 Heart failure, unspecified: Secondary | ICD-10-CM

## 2016-12-25 DIAGNOSIS — E785 Hyperlipidemia, unspecified: Secondary | ICD-10-CM

## 2016-12-27 NOTE — Telephone Encounter (Signed)
12/25/16 Left Message - Called and lmsg for him to CB to get set up for an Echo per DDS.     By Elita Boone

## 2016-12-31 ENCOUNTER — Encounter: Payer: Self-pay | Admitting: Internal Medicine

## 2017-01-02 ENCOUNTER — Ambulatory Visit (INDEPENDENT_AMBULATORY_CARE_PROVIDER_SITE_OTHER): Payer: Medicaid Other

## 2017-01-02 DIAGNOSIS — I5022 Chronic systolic (congestive) heart failure: Secondary | ICD-10-CM

## 2017-01-02 DIAGNOSIS — Z95810 Presence of automatic (implantable) cardiac defibrillator: Secondary | ICD-10-CM

## 2017-01-03 ENCOUNTER — Telehealth: Payer: Self-pay | Admitting: Cardiology

## 2017-01-03 DIAGNOSIS — H25813 Combined forms of age-related cataract, bilateral: Secondary | ICD-10-CM | POA: Diagnosis not present

## 2017-01-03 DIAGNOSIS — H3582 Retinal ischemia: Secondary | ICD-10-CM | POA: Diagnosis not present

## 2017-01-03 DIAGNOSIS — E113293 Type 2 diabetes mellitus with mild nonproliferative diabetic retinopathy without macular edema, bilateral: Secondary | ICD-10-CM | POA: Diagnosis not present

## 2017-01-03 NOTE — Telephone Encounter (Signed)
Pt friend called and stated that pt is going to have to move temporarily. He wanted to know how pt would go about follow up appts/ w/ MD's. Informed him that he currently has no appts w/ MD in our office. And that we can follow his device remotely. Pt is due to see MD in 07/2017 if he has permanently moved then he can have a MD close to him follow his device. Pt friend verbalized understanding.

## 2017-01-03 NOTE — Progress Notes (Signed)
No ICM remote transmission received for 01/02/2017 and next ICM transmission scheduled for 01/22/2017

## 2017-01-11 ENCOUNTER — Other Ambulatory Visit (HOSPITAL_COMMUNITY): Payer: Medicaid Other

## 2017-01-11 DIAGNOSIS — H2513 Age-related nuclear cataract, bilateral: Secondary | ICD-10-CM | POA: Diagnosis not present

## 2017-01-11 DIAGNOSIS — E113491 Type 2 diabetes mellitus with severe nonproliferative diabetic retinopathy without macular edema, right eye: Secondary | ICD-10-CM | POA: Diagnosis not present

## 2017-01-11 DIAGNOSIS — E113412 Type 2 diabetes mellitus with severe nonproliferative diabetic retinopathy with macular edema, left eye: Secondary | ICD-10-CM | POA: Diagnosis not present

## 2017-01-11 DIAGNOSIS — H3582 Retinal ischemia: Secondary | ICD-10-CM | POA: Diagnosis not present

## 2017-01-11 DIAGNOSIS — I6522 Occlusion and stenosis of left carotid artery: Secondary | ICD-10-CM | POA: Diagnosis not present

## 2017-01-17 ENCOUNTER — Other Ambulatory Visit: Payer: Self-pay | Admitting: Internal Medicine

## 2017-01-17 ENCOUNTER — Ambulatory Visit (HOSPITAL_COMMUNITY): Payer: Disability Insurance | Attending: Cardiovascular Disease

## 2017-01-17 ENCOUNTER — Other Ambulatory Visit: Payer: Self-pay

## 2017-01-17 DIAGNOSIS — Z79899 Other long term (current) drug therapy: Secondary | ICD-10-CM | POA: Diagnosis not present

## 2017-01-17 DIAGNOSIS — I25119 Atherosclerotic heart disease of native coronary artery with unspecified angina pectoris: Secondary | ICD-10-CM

## 2017-01-17 DIAGNOSIS — E119 Type 2 diabetes mellitus without complications: Secondary | ICD-10-CM | POA: Diagnosis not present

## 2017-01-17 DIAGNOSIS — E785 Hyperlipidemia, unspecified: Secondary | ICD-10-CM

## 2017-01-17 DIAGNOSIS — I63232 Cerebral infarction due to unspecified occlusion or stenosis of left carotid arteries: Secondary | ICD-10-CM | POA: Diagnosis not present

## 2017-01-17 DIAGNOSIS — I25709 Atherosclerosis of coronary artery bypass graft(s), unspecified, with unspecified angina pectoris: Secondary | ICD-10-CM | POA: Insufficient documentation

## 2017-01-17 DIAGNOSIS — L4050 Arthropathic psoriasis, unspecified: Secondary | ICD-10-CM | POA: Diagnosis not present

## 2017-01-17 DIAGNOSIS — I639 Cerebral infarction, unspecified: Secondary | ICD-10-CM | POA: Diagnosis present

## 2017-01-17 DIAGNOSIS — I119 Hypertensive heart disease without heart failure: Secondary | ICD-10-CM | POA: Insufficient documentation

## 2017-01-17 DIAGNOSIS — I509 Heart failure, unspecified: Secondary | ICD-10-CM

## 2017-01-17 DIAGNOSIS — L409 Psoriasis, unspecified: Secondary | ICD-10-CM | POA: Diagnosis not present

## 2017-01-17 DIAGNOSIS — Z0271 Encounter for disability determination: Secondary | ICD-10-CM

## 2017-01-21 ENCOUNTER — Ambulatory Visit: Payer: Medicaid Other | Admitting: Neurology

## 2017-01-22 ENCOUNTER — Ambulatory Visit (INDEPENDENT_AMBULATORY_CARE_PROVIDER_SITE_OTHER): Payer: Medicaid Other | Admitting: *Deleted

## 2017-01-22 ENCOUNTER — Telehealth: Payer: Self-pay

## 2017-01-22 DIAGNOSIS — I255 Ischemic cardiomyopathy: Secondary | ICD-10-CM | POA: Diagnosis not present

## 2017-01-22 DIAGNOSIS — Z95810 Presence of automatic (implantable) cardiac defibrillator: Secondary | ICD-10-CM | POA: Diagnosis not present

## 2017-01-22 DIAGNOSIS — I5022 Chronic systolic (congestive) heart failure: Secondary | ICD-10-CM

## 2017-01-22 NOTE — Progress Notes (Signed)
EPIC Encounter for ICM Monitoring  Patient Name: Travis Tran is a 61 y.o. male Date: 01/22/2017 Primary Care Physican: Maren Reamer, MD Primary Cardiologist:Smith Electrophysiologist: [facility] Dry Weight:unknown  ,Attempted call to patient and unable to reach.  Left message to return call.  Transmission reviewed.   Thoracic impedance almost to baseline today.  Labs: 10/22/2016 Creatinine 0.99, BUN 26, Potassium 4.3, Sodium 132 07/27/2016 Creatinine 1.04, BUN 14, Potassium 3.5, Sodium 135, EGFR >60  07/05/2016 Creatinine 1.01, BUN 25, Potassium 3.9, Sodium 139  05/17/2016 Creatinine 1.03, BUN 27, Potassium 3.8, Sodium 132  03/24/2016 Creatinine 0.88, BUN 16, Potassium 4.1, Sodium 138   Prescribed dosage Furosemide 40 mg bid.   Recommendations: NONE - Unable to reach patient   Follow-up plan: ICM clinic phone appointment on 02/22/2017.  Copy of ICM check sent to device physician.   3 month ICM trend: 01/22/2017   1 Year ICM trend:      Rosalene Billings, RN 01/22/2017 4:25 PM

## 2017-01-22 NOTE — Telephone Encounter (Signed)
-----   Message from Marvel Plan, MD sent at 01/18/2017  1:37 PM EST ----- Could you please let the patient know that the heart ultrasound test done recently showed normal heart pumping function. Heart pumping now normalized and much improved from before. Continue to follow up with cardiology. Please continue current treatment. Thanks.  Marvel Plan, MD PhD Stroke Neurology 01/18/2017 1:37 PM

## 2017-01-22 NOTE — Telephone Encounter (Signed)
Left vm for patient to call back about echocardiogram test results.

## 2017-01-22 NOTE — Progress Notes (Signed)
Remote ICD transmission.   

## 2017-01-23 ENCOUNTER — Encounter: Payer: Self-pay | Admitting: Cardiology

## 2017-01-23 LAB — CUP PACEART REMOTE DEVICE CHECK
Battery Voltage: 3 V
Brady Statistic RV Percent Paced: 0.01 %
Date Time Interrogation Session: 20180220083323
HIGH POWER IMPEDANCE MEASURED VALUE: 62 Ohm
Lead Channel Impedance Value: 304 Ohm
Lead Channel Impedance Value: 399 Ohm
Lead Channel Sensing Intrinsic Amplitude: 15.5 mV
Lead Channel Setting Pacing Amplitude: 2.5 V
MDC IDC LEAD IMPLANT DT: 20170808
MDC IDC LEAD LOCATION: 753860
MDC IDC MSMT BATTERY REMAINING LONGEVITY: 131 mo
MDC IDC MSMT LEADCHNL RV PACING THRESHOLD AMPLITUDE: 0.5 V
MDC IDC MSMT LEADCHNL RV PACING THRESHOLD PULSEWIDTH: 0.4 ms
MDC IDC MSMT LEADCHNL RV SENSING INTR AMPL: 15.5 mV
MDC IDC PG IMPLANT DT: 20170808
MDC IDC SET LEADCHNL RV PACING PULSEWIDTH: 0.4 ms
MDC IDC SET LEADCHNL RV SENSING SENSITIVITY: 0.3 mV

## 2017-01-23 NOTE — Telephone Encounter (Signed)
LEft vm for patient to call back about echo test.

## 2017-01-24 ENCOUNTER — Other Ambulatory Visit: Payer: Self-pay | Admitting: Internal Medicine

## 2017-01-24 NOTE — Telephone Encounter (Signed)
Left 3rd message for patient to call back about Echo results.

## 2017-01-25 ENCOUNTER — Other Ambulatory Visit: Payer: Self-pay | Admitting: *Deleted

## 2017-01-25 MED ORDER — FUROSEMIDE 40 MG PO TABS: 40.0000 mg | ORAL_TABLET | Freq: Two times a day (BID) | ORAL | 8 refills | Status: DC

## 2017-01-25 MED ORDER — ATORVASTATIN CALCIUM 80 MG PO TABS: 80.0000 mg | ORAL_TABLET | Freq: Every day | ORAL | 8 refills | Status: DC

## 2017-01-25 NOTE — Telephone Encounter (Signed)
Patients friend Dorene Sorrow left a msg on the refill vm requesting Dr Katrinka Blazing to refill furosemide and atorvastatin for the patient. He stated that an emergency room physician prescribed these for the patient. Okay to refill? Please advise. Thanks, MI

## 2017-01-25 NOTE — Telephone Encounter (Signed)
That's fine. Thanks!

## 2017-01-28 ENCOUNTER — Encounter: Payer: Self-pay | Admitting: Internal Medicine

## 2017-01-28 NOTE — Progress Notes (Signed)
Pt see by Dr Candis Musa, Rheum, [facility] 01/21/17 Psoriatic arthritis posiasis Started [facility] 30mg  po bid Return 18months  Reviewed and scan notes

## 2017-02-06 ENCOUNTER — Encounter: Payer: Self-pay | Admitting: Cardiology

## 2017-02-06 ENCOUNTER — Encounter: Payer: Self-pay | Admitting: Neurology

## 2017-02-06 ENCOUNTER — Ambulatory Visit (INDEPENDENT_AMBULATORY_CARE_PROVIDER_SITE_OTHER): Payer: Medicaid Other | Admitting: Neurology

## 2017-02-06 VITALS — BP 129/73 | HR 92 | Wt 150.6 lb

## 2017-02-06 DIAGNOSIS — I63232 Cerebral infarction due to unspecified occlusion or stenosis of left carotid arteries: Secondary | ICD-10-CM | POA: Diagnosis not present

## 2017-02-06 DIAGNOSIS — I5022 Chronic systolic (congestive) heart failure: Secondary | ICD-10-CM | POA: Diagnosis not present

## 2017-02-06 DIAGNOSIS — E785 Hyperlipidemia, unspecified: Secondary | ICD-10-CM | POA: Diagnosis not present

## 2017-02-06 DIAGNOSIS — Q240 Dextrocardia: Secondary | ICD-10-CM

## 2017-02-06 DIAGNOSIS — I25709 Atherosclerosis of coronary artery bypass graft(s), unspecified, with unspecified angina pectoris: Secondary | ICD-10-CM

## 2017-02-06 DIAGNOSIS — I6521 Occlusion and stenosis of right carotid artery: Secondary | ICD-10-CM | POA: Diagnosis not present

## 2017-02-06 DIAGNOSIS — Z95810 Presence of automatic (implantable) cardiac defibrillator: Secondary | ICD-10-CM

## 2017-02-06 NOTE — Patient Instructions (Addendum)
-   continue ASA and plavix and lipitor for stroke and cardiac prevention - continue to follow up with cardiology and Dr. Corliss Skains - follow up with rheumatology as scheduled  - Follow up with your primary care physician for stroke risk factor modification. Recommend maintain blood pressure goal 120-140/80, diabetes with hemoglobin A1c goal below 7.0% and lipids with LDL cholesterol goal below 70 mg/dL.  - check BP and glucose at home. BP goal 120-140 ideally.  - follow up in 09/2017 around time with cardiology appointment.

## 2017-02-06 NOTE — Progress Notes (Signed)
STROKE NEUROLOGY FOLLOW UP NOTE  NAME: Travis Tran DOB: [birth year]  REASON FOR VISIT: stroke follow up HISTORY FROM: no one  Today we had the pleasure of seeing Travis Tran in follow-up at our Neurology [facility]. Pt was accompanied by pt and chart.   History Summary Travis Tran is a 61 y.o. male with history of situs inversus, DM, HTN, CAD, cardiomyopathy, HLD, s/p CABG x5, and a recent diagnosis of CHF w/known EF 10% was admitted on 03/20/16 for motor aphasia with garbled speech and R sided weakness and numbness. Symptoms resolved. MRI showed no acute infarct. CTA head and neck showed left ICA occlusion with some reconstitution at terminus. Right ICA origin 65%. Cerebral angio showed left ICA occlusion with right ICA 80-85% stenosis at petrous portion. CUS right ICA 60-79% stenosis. EF 15%, LDL 120 and A1C 15.1. His left brain TIA was considered due to left ICA occlusion and right ICA high grade stenosis in the setting of low BP (SBP 90-100s). He received right ICA intracranial stent. His BP still low at 100s post procedure due to multiple BP meds for his heart failure. Recommended BP goal 120-140 and compression socks. He was discharged on ASA and plavix and high dose lipitor.   06/13/16 follow up - the patient has been doing well. No stroke like symptoms. He follows with cardiology Dr. Katrinka Blazing and repeat TTE in 05/2016 showed EF 20%. He was recently referred to Dr. Elberta Fortis for ICD placement. Pt is yet to make up his decision. BP today 115/65. He is on coreg, lasix, spironolactone, and lisinopril. He feels some lightheadedness soon after his BP medication in the morning but not severe. He is continued on ASA and plavix and lipitor    09/19/16 follow up - pt has been doing well. He followed with cardiology and had ICD placement on 07/10/16 for low EF. Before the procedure his plavix was not refilled and post op his plavix was not restarted. However, he still on ASA and lipitor daily. Repeat CTA  head and neck in 06/2016 showed patent petrous ICA stent. Pt otherwise no complains, on lantus, glucose 150-250 at home. BP toady 119/72. Has not see Dr. Corliss Skains yet since the ICA stent.   Interval History During the interval time, he has been doing well. He saw Dr. Corliss Skains in 10/2016 and complained of right foot intermittent dragging, and right eye blurry vision. Repeat CTA head and neck showed patent stent and unchanged carotid stenosis as before. He also follows with cardiology and ICD works well. Follows with rheumatology for arthritis and about to start po immunotherapy. His BP improved, at home 110-115, today 129/73 in clinic. Glucose around 150-200 at home, on lantus and novolog. Pt recently moved to [place] but still would like to continue following up here. Complains of intermittent right shoulder and neck pain, mild. He is asking whether it is related to his ICD which is on the right chest. I told him I can not exclude that possibility, but it most commonly associated with neck DJD.   REVIEW OF SYSTEMS: Full 14 system review of systems performed and notable only for those listed below and in HPI above, all others are negative:  Constitutional:   Cardiovascular:  Ear/Nose/Throat:  Ringing in ears Skin:  Eyes:   Respiratory:   Gastroitestinal:   Genitourinary:  Hematology/Lymphatic:   Endocrine:  Musculoskeletal:   Allergy/Immunology:   Neurological:   Psychiatric:  Sleep:   The following represents the patient's updated allergies  and side effects list: Allergies  Allergen Reactions  . Celecoxib Swelling  . Sulfa Antibiotics Other (See Comments)    unknown    The neurologically relevant items on the patient's problem list were reviewed on today's visit.  Neurologic Examination  A problem focused neurological exam (12 or more points of the single system neurologic examination, vital signs counts as 1 point, cranial nerves count for 8 points) was performed.  Blood  pressure 129/73, pulse 92, weight 150 lb 9.6 oz (68.3 kg).  General - Well nourished, well developed, in no apparent distress.  Ophthalmologic - Sharp disc margins OU.   Cardiovascular - Regular rate and rhythm with no murmur.  musculoskeletal - bilateral hand joint deformities, R>L.  Mental Status -  Level of arousal and orientation to time, place, and person were intact. Language including expression, naming, repetition, comprehension was assessed and found intact. Fund of Knowledge was assessed and was intact.  Cranial Nerves II - XII - II - Visual field intact OU. III, IV, VI - Extraocular movements intact. V - Facial sensation intact bilaterally. VII - Facial movement intact bilaterally. VIII - Hearing & vestibular intact bilaterally. X - Palate elevates symmetrically. XI - Chin turning & shoulder shrug intact bilaterally. XII - Tongue protrusion intact.  Motor Strength - The patient's strength was normal in all extremities and pronator drift was absent.  Bulk was normal and fasciculations were absent.   Motor Tone - Muscle tone was assessed at the neck and appendages and was normal.  Reflexes - The patient's reflexes were 1+ in all extremities and he had no pathological reflexes.  Sensory - Light touch, temperature/pinprick, vibration and proprioception, and Romberg testing were assessed and were normal.    Coordination - The patient had normal movements in the hands and feet with no ataxia or dysmetria.  Tremor was absent.  Gait and Station - mild left hemiparetic gait with gait initiation.    Data reviewed: I personally reviewed the images and agree with the radiology interpretations.  Ct Head Wo Contrast 03/20/2016 No acute intracranial abnormalities. Mild RIGHT maxillary sinus disease.   Ct Angio Head & Neck W/cm &/or Wo/cm 03/20/2016 1. 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 2. Long segment high-grade stenosis of the  cervical left ICA and FUNCTIONAL OCCLUSION OF THE LEFT ICA SIPHON (unclear whether there is residual trickle flow). Reconstituted flow at the left ICA terminus such that the left MCA and ACA origins remain normal. 3. Very advanced for age atherosclerosis in the head and neck. Additional atherosclerotic moderate or severe stenosis: - Right ICA origin (65%), - Right ICA siphon, - Left Vertebral artery V4 segment, - Right PCA P2 segment (moderate) - Left ACA callosomarginal artery origin. 4. No cortically based acute infarct identified.   Mr Brain Wo Contrast 03/20/2016 No acute infarct or intracranial hemorrhage. Abnormal appearance of the left internal carotid artery. Please see CT angiogram report. Mild chronic microvascular changes most notable left parietal lobe. Right parietal region small arachnoid cyst may be present as noted above. Moderate mucosal thickening right maxillary sinus.   Cerebral angio 1.Occluded LT ICA at the cavernous seg at the opthalmic artery level. 2.Approx 80 % to 85% stenosis of RT ICA petrous -cavernous junction. 3.Approx 75 to 80% stenosis of LT VBJ distal to LT PICA origin.  TTE 03/21/16 - Left ventricle: The cavity size was normal. Systolic function was  normal. The estimated ejection fraction was 15%. Severe diffuse  hypokinesis with  regional variations. There is akinesis of the  entireapical myocardium. There is akinesis of the  mid-apicallateral myocardium. - Atrial septum: No defect or patent foramen ovale was identified. - Impressions: Limited study. Severe LVE. EF 15% Diffuse  hypokinesis  RV normal size  Bubble study negative for right to left shunt. Impressions: - Limited study. Severe LVE. EF 15% Diffuse hypokinesis  RV normal size  Bubble study negative for right to left shunt.  TTE 05/07/2016 - Procedure narrative: Transthoracic echocardiography. Image  quality was adequate. Intravenous contrast (Definity) was  administered. - Status,  risk factors: Dextrocardia. - Left ventricle: The cavity size was normal. Wall thickness was  normal. The estimated ejection fraction was 20%. Diffuse  hypokinesis. No LV thrombus. Doppler parameters are consistent  with abnormal left ventricular relaxation (grade 1 diastolic  dysfunction). - Aortic valve: There was no stenosis. - Mitral valve: There was trivial regurgitation. - Left atrium: The atrium was mildly dilated. - Right ventricle: The cavity size was normal. Systolic function  was moderately reduced. - Right atrium: The atrium was mildly dilated. - Pulmonary arteries: No complete TR doppler jet so unable to  estimate PA systolic pressure. - Inferior vena cava: The vessel was normal in size. The  respirophasic diameter changes were in the normal range (= 50%),  consistent with normal central venous pressure. Impressions: - Dextrocardia. Normal LV size with EF 20%, diffuse hypokinesis.  Normal RV size with moderately decreased systolic function. No  significant valvular abnormalities.  CUS - - Right - 60% to 79% ICA stenosis at the bifurcation/ proximal ICA.  Moderate heterogeneous plaque. - Right - - Left - Moderate heterogeneous plaque noted in the bifurcation  with diminished velocities throughout the ICA with a abnormal  damped waveform and loss of diastolic flow suggestive of a  probable more distal obstruction. - Bilateral - Vertebral artery flow is antegrade.  CTA head and neck 06/21/16 1. Patent stent of the right petrocavernous junction of ICA. 2. Stable 40% stenosis of right ICA origin. 3. Stable long segment of stenosis of the left internal carotid artery with probable occlusion of the terminus (possible trickle flow) without significant interval change. 4. Patent circle of Willis with left MCA distribution provided primarily by anterior communicating artery and pial collaterals. The left MCA is overall mildly diminished in comparison with the  right. 5. Stable stenosis of the left vertebral artery origin partially obscured by streak and motion artifact. 6. Stable stenosis of the left proximal V4 segment. 7. No evidence of large acute infarct, mass effect, intracranial hemorrhage, or abnormal enhancement of the brain.  Ct Angio Head and neck W Or Wo Contrast 10/30/2016 IMPRESSION: 1. Patent right ICA petrocavernous junction stent. 2. Severe calcific atherosclerosis of the bilateral cavernous and paraclinoid segments. Occluded left ICA at level of distal cavernous segment. 3. Patent circle of Willis. Areas of stenosis in the anterior and posterior circulation compatible with intracranial atherosclerosis, advanced for age. 4. Stable mild 40% proximal right ICA stenosis. 5. Stable long segment of severe stenosis of the left internal carotid artery from the origin to occlusion at the distal cavernous segment. 6. Stable moderate approximately 50% stenosis of left vertebral artery origin.   TTE 01/17/17 - Left ventricle: The cavity size was normal. Wall thickness was   increased in a pattern of mild LVH. Systolic function was normal.   The estimated ejection fraction was in the range of 50% to 55%.   Doppler parameters are consistent with abnormal left ventricular  relaxation (grade 1 diastolic dysfunction).  Component     Latest Ref Rng 02/27/2016 02/28/2016 03/23/2016 03/24/2016  Cholesterol     0 - 200 mg/dL  254    Triglycerides     <150 mg/dL  270    HDL Cholesterol     >40 mg/dL  49    Total CHOL/HDL Ratio       3.9    VLDL     0 - 40 mg/dL  22    LDL (calc)     0 - 99 mg/dL  623 (H)    Hemoglobin A1C     4.8 - 5.6 % 15.1 (H)     Mean Plasma Glucose      387     TSH     0.350 - 4.500 uIU/mL 1.446     HIV     Non Reactive Non Reactive     Platelet Function  P2Y12     194 - 418 PRU   113 (L) 85 (L)   Component     Latest Ref Rng & Units 10/19/2016  Hemoglobin A1C      13.4    Assessment: As you may recall, he is a  61 y.o. Caucasian male with PMH of situs inversus, DM, HTN, CAD, HLD, s/p CABG x5, and CHF w/known EF 10% was admitted on 03/20/16 for transient motor aphasia and R sided weakness and numbness. MRI showed no acute infarct. CTA head and neck showed left ICA occlusion. Right ICA origin 65%. Cerebral angio showed left ICA occlusion with right ICA 80-85% stenosis at petrous portion. CUS right ICA 60-79% stenosis. EF 15%, LDL 120 and A1C 15.1. His left brain TIA was considered due to left ICA occlusion and right ICA high grade stenosis in the setting of low BP (SBP 90-100s). He received right ICA intracranial stent. His BP still low at 100s post procedure due to multiple BP meds for his heart failure. Recommended BP goal 120-140 and compression socks. He was discharged on ASA and plavix and high dose lipitor. During the interval time, the patient has been doing well. Repeat TTE in 05/2016 showed EF 20%. Repeat CTA head and neck in 06/2016 showed patent right petrous ICA stent. Left ICA still occluded. He had ICD placement in 07/2016. Continued on DAPT and lipitor. Repeat CTA head and neck in 10/2016 showed no change with patent stent. Repeat TEE 01/2017 showed EF 50-55%, normalized. Pt recently moved to [place] and doing well from stroke standpoint.   Plan:  - continue ASA and plavix and lipitor for stroke and cardiac prevention - continue to follow up with cardiology and Dr. Corliss Skains - follow up with rheumatology as scheduled  - Follow up with your primary care physician for stroke risk factor modification. Recommend maintain blood pressure goal 120-140/80, diabetes with hemoglobin A1c goal below 7.0% and lipids with LDL cholesterol goal below 70 mg/dL.  - check BP and glucose at home. BP goal 120-140 ideally.  - follow up in 09/2017 around time with cardiology appointment.   I spent more than 25 minutes of face to face time with the patient. Greater than 50% of time was spent in counseling and  coordination of care. We reviewed CTA and TTE result, discussed BP and glucose monitoring , and follow up with cardiology and Dr. Corliss Skains.    No orders of the defined types were placed in this encounter.   No orders of the defined types were placed in this encounter.  Patient Instructions  - continue ASA and plavix and lipitor for stroke and cardiac prevention - continue to follow up with cardiology and Dr. Corliss Skains - follow up with rheumatology as scheduled  - Follow up with your primary care physician for stroke risk factor modification. Recommend maintain blood pressure goal 120-140/80, diabetes with hemoglobin A1c goal below 7.0% and lipids with LDL cholesterol goal below 70 mg/dL.  - check BP and glucose at home. BP goal 120-140 ideally.  - follow up in 09/2017 around time with cardiology appointment.    Marvel Plan, MD PhD [facility] [address] [place], [place] [address] [phone]

## 2017-02-11 NOTE — Progress Notes (Signed)
ICM remote transmission rescheduled from 02/22/2017 to 02/26/2017.  

## 2017-02-25 ENCOUNTER — Ambulatory Visit (HOSPITAL_COMMUNITY)
Admission: RE | Admit: 2017-02-25 | Discharge: 2017-02-25 | Disposition: A | Discharge status: Home / Self Care | Payer: Medicaid Other | Source: Ambulatory Visit | Attending: Internal Medicine | Admitting: Internal Medicine

## 2017-02-25 ENCOUNTER — Encounter (HOSPITAL_COMMUNITY): Payer: Self-pay | Admitting: Internal Medicine

## 2017-02-25 VITALS — BP 118/64 | HR 91 | Wt 150.2 lb

## 2017-02-25 DIAGNOSIS — I2510 Atherosclerotic heart disease of native coronary artery without angina pectoris: Secondary | ICD-10-CM

## 2017-02-25 DIAGNOSIS — I480 Paroxysmal atrial fibrillation: Secondary | ICD-10-CM | POA: Diagnosis not present

## 2017-02-25 DIAGNOSIS — I5022 Chronic systolic (congestive) heart failure: Secondary | ICD-10-CM | POA: Diagnosis not present

## 2017-02-25 MED ORDER — SACUBITRIL-VALSARTAN 49-51 MG PO TABS: 1.0000 | ORAL_TABLET | Freq: Two times a day (BID) | ORAL | 3 refills | Status: DC

## 2017-02-25 NOTE — Patient Instructions (Signed)
INCREASE Entresto to 49/51 mg twice daily. Can "double up" on your current 24/26 mg tablets (Take 2 tablets twice daily). New Rx has been sent to your pharmacy for 49/51 mg tablets: Take 1 tablet twice daily.  Follow up 3 months with Dr. Gala Romney.  Do the following things EVERYDAY: 1) Weigh yourself in the morning before breakfast. Write it down and keep it in a log. 2) Take your medicines as prescribed 3) Eat low salt foods-Limit salt (sodium) to 2000 mg per day.  4) Stay as active as you can everyday 5) Limit all fluids for the day to less than 2 liters
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
## 2017-02-26 ENCOUNTER — Telehealth: Payer: Self-pay | Admitting: Cardiology

## 2017-02-26 ENCOUNTER — Ambulatory Visit (INDEPENDENT_AMBULATORY_CARE_PROVIDER_SITE_OTHER): Payer: Medicaid Other

## 2017-02-26 ENCOUNTER — Telehealth (HOSPITAL_COMMUNITY): Payer: Self-pay | Admitting: Pharmacist

## 2017-02-26 DIAGNOSIS — I5022 Chronic systolic (congestive) heart failure: Secondary | ICD-10-CM | POA: Diagnosis not present

## 2017-02-26 DIAGNOSIS — Z95810 Presence of automatic (implantable) cardiac defibrillator: Secondary | ICD-10-CM

## 2017-02-26 NOTE — Telephone Encounter (Signed)
Entresto PA approved by [place] Medicaid through 02/26/18.   Tyler Deis. Bonnye Fava, PharmD, BCPS, CPP Clinical Pharmacist Pager: [phone] Phone: [phone] 02/26/2017 11:27 AM

## 2017-02-26 NOTE — Telephone Encounter (Signed)
Spoke with pt and reminded pt of remote transmission that is due today. Pt verbalized understanding.   

## 2017-02-28 ENCOUNTER — Other Ambulatory Visit: Payer: Self-pay | Admitting: Interventional Cardiology

## 2017-02-28 ENCOUNTER — Telehealth: Payer: Self-pay

## 2017-02-28 NOTE — Telephone Encounter (Signed)
I do not see where Dr Smith has ever filled this for the patient. Okay to refill? Please advise. Thanks, MI 

## 2017-02-28 NOTE — Telephone Encounter (Signed)
Remote ICM transmission received.  Attempted patient call and left message to return call.   

## 2017-02-28 NOTE — Progress Notes (Signed)
EPIC Encounter for ICM Monitoring  Patient Name: Travis Tran is a 61 y.o. male Date: 02/28/2017 Primary Care Physican: Maren Reamer, MD Primary Cardiologist:Smith/Bensimhon Electrophysiologist: [facility] Dry Weight:unknown      Attempted call to patient and unable to reach.  Left message to return call.  Transmission reviewed.    Thoracic impedance normal but was abnormal suggesting fluid accumulation from 01/31/2017 to 02/20/2017.  Prescribed dosage Furosemide 40 mg bid.    Labs: 10/22/2016 Creatinine 0.99, BUN 26, Potassium 4.3, Sodium 132 07/27/2016 Creatinine 1.04, BUN 14, Potassium 3.5, Sodium 135, EGFR >60  07/05/2016 Creatinine 1.01, BUN 25, Potassium 3.9, Sodium 139  05/17/2016 Creatinine 1.03, BUN 27, Potassium 3.8, Sodium 132  03/24/2016 Creatinine 0.88, BUN 16, Potassium 4.1, Sodium 138   Recommendations: NONE - Unable to reach patient   Follow-up plan: ICM clinic phone appointment on 04/02/2017.  Copy of ICM check sent to device physician.   3 month ICM trend: 3/38/2018   1 Year ICM trend:      Rosalene Billings, RN 02/28/2017 2:33 PM

## 2017-02-28 NOTE — Telephone Encounter (Signed)
This should come from PCP.  Thanks! 

## 2017-03-02 ENCOUNTER — Other Ambulatory Visit: Payer: Self-pay | Admitting: Internal Medicine

## 2017-03-07 ENCOUNTER — Telehealth: Payer: Self-pay

## 2017-03-07 NOTE — Telephone Encounter (Signed)
Called pt to find out if he wants colonoscopy scheduled

## 2017-04-02 ENCOUNTER — Ambulatory Visit (INDEPENDENT_AMBULATORY_CARE_PROVIDER_SITE_OTHER): Payer: Medicaid Other

## 2017-04-02 DIAGNOSIS — I5022 Chronic systolic (congestive) heart failure: Secondary | ICD-10-CM | POA: Diagnosis not present

## 2017-04-02 DIAGNOSIS — Z95810 Presence of automatic (implantable) cardiac defibrillator: Secondary | ICD-10-CM | POA: Diagnosis not present

## 2017-04-04 ENCOUNTER — Telehealth: Payer: Self-pay

## 2017-04-04 NOTE — Progress Notes (Signed)
EPIC Encounter for ICM Monitoring  Patient Name: Travis Tran is a 61 y.o. male Date: 04/04/2017 Primary Care Physican: Dawn T Langeland, MD Primary Cardiologist:Smith/Bensimhon Electrophysiologist: Camnitz Dry Weight:unknown     Attempted call to patient and unable to reach.  Left message to return call.  Transmission reviewed.    Thoracic impedance normal.  Prescribed dosage Furosemide 40 mg bid.    Labs: 10/22/2016 Creatinine 0.99, BUN 26, Potassium 4.3, Sodium 132 07/27/2016 Creatinine 1.04, BUN 14, Potassium 3.5, Sodium 135, EGFR >60  07/05/2016 Creatinine 1.01, BUN 25, Potassium 3.9, Sodium 139  05/17/2016 Creatinine 1.03, BUN 27, Potassium 3.8, Sodium 132  03/24/2016 Creatinine 0.88, BUN 16, Potassium 4.1, Sodium 138   Recommendations: NONE - Unable to reach patient   Follow-up plan: ICM clinic phone appointment on 05/06/2017.    Copy of ICM check sent to device physician.   3 month ICM trend: 04/02/2017   1 Year ICM trend:      Laurie S Short, RN 04/04/2017 3:28 PM    

## 2017-04-04 NOTE — Telephone Encounter (Signed)
Remote ICM transmission received.  Attempted patient call and left message to return call.   

## 2017-04-11 ENCOUNTER — Telehealth (HOSPITAL_COMMUNITY): Payer: Self-pay

## 2017-04-11 NOTE — Telephone Encounter (Signed)
Called to schedule 6 month f/u cta, no answer. AW 

## 2017-04-18 ENCOUNTER — Encounter: Payer: Self-pay | Admitting: Internal Medicine

## 2017-05-06 ENCOUNTER — Ambulatory Visit (INDEPENDENT_AMBULATORY_CARE_PROVIDER_SITE_OTHER): Payer: Medicaid Other | Admitting: *Deleted

## 2017-05-06 ENCOUNTER — Telehealth (HOSPITAL_COMMUNITY): Payer: Self-pay

## 2017-05-06 DIAGNOSIS — I5022 Chronic systolic (congestive) heart failure: Secondary | ICD-10-CM | POA: Diagnosis not present

## 2017-05-06 DIAGNOSIS — I255 Ischemic cardiomyopathy: Secondary | ICD-10-CM

## 2017-05-06 DIAGNOSIS — Z95810 Presence of automatic (implantable) cardiac defibrillator: Secondary | ICD-10-CM | POA: Diagnosis not present

## 2017-05-06 NOTE — Progress Notes (Signed)
Remote ICD transmission.   

## 2017-05-06 NOTE — Progress Notes (Signed)
EPIC Encounter for ICM Monitoring  Patient Name: Travis Tran is a 61 y.o. male Date: 05/06/2017 Primary Care Physican: Maren Reamer, MD Primary Cardiologist:Smith/Bensimhon Electrophysiologist: Camnitz Dry Weight:142 lbs  Heart Failure questions reviewed, pt asymptomatic.   Thoracic impedance just starting to trend below baseline in the last day.   Prescribed dosage Furosemide 40 mg bid.   Labs: 10/22/2016 Creatinine 0.99, BUN 26, Potassium 4.3, Sodium 132 07/27/2016 Creatinine 1.04, BUN 14, Potassium 3.5, Sodium 135, EGFR >60  07/05/2016 Creatinine 1.01, BUN 25, Potassium 3.9, Sodium 139  05/17/2016 Creatinine 1.03, BUN 27, Potassium 3.8, Sodium 132  03/24/2016 Creatinine 0.88, BUN 16, Potassium 4.1, Sodium 138   Recommendations: No changes. Discussed to limit salt intake to 2000 mg/day and fluid intake to < 2 liters/day.  Encouraged to call for fluid symptoms or use local ER for any urgent symptoms.  Follow-up plan: ICM clinic phone appointment on 06/06/2017.  Office appointment scheduled on 05/29/2017 with Dr. Haroldine Laws and 07/11/2017 with Dr Curt Bears and Dr Tamala Julian.  Copy of ICM check sent to device physician.   3 month ICM trend: 05/06/2017   1 Year ICM trend:      Rosalene Billings, RN 05/06/2017 1:37 PM

## 2017-05-06 NOTE — Telephone Encounter (Signed)
Called to schedule 6 month f/u cta, no answer. AW

## 2017-05-08 LAB — CUP PACEART REMOTE DEVICE CHECK
Battery Remaining Longevity: 129 mo
Battery Voltage: 3.01 V
HIGH POWER IMPEDANCE MEASURED VALUE: 61 Ohm
Implantable Lead Location: 753860
Implantable Pulse Generator Implant Date: 20170808
Lead Channel Impedance Value: 304 Ohm
Lead Channel Pacing Threshold Pulse Width: 0.4 ms
Lead Channel Sensing Intrinsic Amplitude: 14 mV
Lead Channel Setting Pacing Amplitude: 2.5 V
Lead Channel Setting Pacing Pulse Width: 0.4 ms
Lead Channel Setting Sensing Sensitivity: 0.3 mV
MDC IDC LEAD IMPLANT DT: 20170808
MDC IDC MSMT LEADCHNL RV IMPEDANCE VALUE: 399 Ohm
MDC IDC MSMT LEADCHNL RV PACING THRESHOLD AMPLITUDE: 0.5 V
MDC IDC MSMT LEADCHNL RV SENSING INTR AMPL: 14 mV
MDC IDC SESS DTM: 20180604143628
MDC IDC STAT BRADY RV PERCENT PACED: 0 %

## 2017-05-14 ENCOUNTER — Encounter: Payer: Self-pay | Admitting: Cardiology

## 2017-05-29 ENCOUNTER — Encounter (HOSPITAL_COMMUNITY): Payer: Medicaid Other | Admitting: Internal Medicine

## 2017-05-30 ENCOUNTER — Encounter: Payer: Self-pay | Admitting: Cardiology

## 2017-06-06 ENCOUNTER — Telehealth: Payer: Self-pay | Admitting: Cardiology

## 2017-06-06 ENCOUNTER — Ambulatory Visit (INDEPENDENT_AMBULATORY_CARE_PROVIDER_SITE_OTHER): Payer: Medicaid Other

## 2017-06-06 DIAGNOSIS — I5022 Chronic systolic (congestive) heart failure: Secondary | ICD-10-CM | POA: Diagnosis not present

## 2017-06-06 DIAGNOSIS — Z95810 Presence of automatic (implantable) cardiac defibrillator: Secondary | ICD-10-CM

## 2017-06-06 NOTE — Telephone Encounter (Signed)
Spoke with pt and reminded pt of remote transmission that is due today. Pt verbalized understanding.   

## 2017-06-07 NOTE — Progress Notes (Signed)
EPIC Encounter for ICM Monitoring  Patient Name: Travis Tran is a 60 y.o. male Date: 06/07/2017 Primary Care Physican: Langeland, Dawn T, MD Primary Cardiologist:Smith/Bensimhon Electrophysiologist: Camnitz Dry Weight:141 lbs  Heart Failure questions reviewed, pt asymptomatic.   Thoracic impedance normal  Prescribed dosage Furosemide 40 mg bid.   Labs: 10/22/2016 Creatinine 0.99, BUN 26, Potassium 4.3, Sodium 132 07/27/2016 Creatinine 1.04, BUN 14, Potassium 3.5, Sodium 135, EGFR >60  07/05/2016 Creatinine 1.01, BUN 25, Potassium 3.9, Sodium 139  05/17/2016 Creatinine 1.03, BUN 27, Potassium 3.8, Sodium 132  03/24/2016 Creatinine 0.88, BUN 16, Potassium 4.1, Sodium 138   Recommendations: No changes.   Encouraged to call for fluid symptoms.  Follow-up plan: ICM clinic phone appointment on 08/13/2017.  Office appointment scheduled 07/11/2017 with Dr. Smith, Dr Camnitz and Dr Bensimhon.  Copy of ICM check sent to device physician.   3 month ICM trend: 06/07/2017   1 Year ICM trend:      Laurie S Short, RN 06/07/2017 7:41 AM    

## 2017-06-12 ENCOUNTER — Other Ambulatory Visit: Payer: Self-pay | Admitting: *Deleted

## 2017-06-12 ENCOUNTER — Other Ambulatory Visit: Payer: Self-pay | Admitting: Interventional Cardiology

## 2017-06-12 DIAGNOSIS — I25810 Atherosclerosis of coronary artery bypass graft(s) without angina pectoris: Secondary | ICD-10-CM

## 2017-06-12 DIAGNOSIS — I63232 Cerebral infarction due to unspecified occlusion or stenosis of left carotid arteries: Secondary | ICD-10-CM

## 2017-06-12 DIAGNOSIS — I5022 Chronic systolic (congestive) heart failure: Secondary | ICD-10-CM

## 2017-06-12 DIAGNOSIS — I10 Essential (primary) hypertension: Secondary | ICD-10-CM

## 2017-06-12 DIAGNOSIS — Z95810 Presence of automatic (implantable) cardiac defibrillator: Secondary | ICD-10-CM

## 2017-06-12 DIAGNOSIS — Q240 Dextrocardia: Secondary | ICD-10-CM

## 2017-06-12 MED ORDER — SPIRONOLACTONE 25 MG PO TABS: ORAL_TABLET | ORAL | 1 refills | Status: DC

## 2017-06-13 ENCOUNTER — Other Ambulatory Visit: Payer: Self-pay | Admitting: Interventional Cardiology

## 2017-06-13 DIAGNOSIS — Z95810 Presence of automatic (implantable) cardiac defibrillator: Secondary | ICD-10-CM

## 2017-06-13 DIAGNOSIS — Q240 Dextrocardia: Secondary | ICD-10-CM

## 2017-06-13 DIAGNOSIS — I25810 Atherosclerosis of coronary artery bypass graft(s) without angina pectoris: Secondary | ICD-10-CM

## 2017-06-13 DIAGNOSIS — I63232 Cerebral infarction due to unspecified occlusion or stenosis of left carotid arteries: Secondary | ICD-10-CM

## 2017-06-13 DIAGNOSIS — I5022 Chronic systolic (congestive) heart failure: Secondary | ICD-10-CM

## 2017-06-13 DIAGNOSIS — I10 Essential (primary) hypertension: Secondary | ICD-10-CM

## 2017-06-13 NOTE — Telephone Encounter (Signed)
Medication Detail    Disp Refills Start End   spironolactone (ALDACTONE) 25 MG tablet 30 tablet 0 06/13/2017    Sig: TAKE 1/2 TABLET BY MOUTH ON MONDAYS/WEDNESDAYS/FRIDAYS   Sent to pharmacy as: spironolactone (ALDACTONE) 25 MG tablet   Notes to Pharmacy: Please call our office to schedule an yearly appointment for November for future refills. [phone]. 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

## 2017-06-26 ENCOUNTER — Telehealth (HOSPITAL_COMMUNITY): Payer: Self-pay

## 2017-06-26 NOTE — Telephone Encounter (Signed)
Called to schedule f/u, no answer, no vm. AW 

## 2017-07-11 ENCOUNTER — Ambulatory Visit (HOSPITAL_COMMUNITY)
Admission: RE | Admit: 2017-07-11 | Discharge: 2017-07-11 | Disposition: A | Discharge status: Home / Self Care | Payer: Medicaid Other | Source: Ambulatory Visit | Attending: Internal Medicine | Admitting: Internal Medicine

## 2017-07-11 ENCOUNTER — Ambulatory Visit (INDEPENDENT_AMBULATORY_CARE_PROVIDER_SITE_OTHER): Payer: Medicaid Other | Admitting: Interventional Cardiology

## 2017-07-11 ENCOUNTER — Other Ambulatory Visit (HOSPITAL_COMMUNITY): Payer: Self-pay | Admitting: Internal Medicine

## 2017-07-11 ENCOUNTER — Encounter: Payer: Self-pay | Admitting: Cardiology

## 2017-07-11 ENCOUNTER — Encounter: Payer: Self-pay | Admitting: Interventional Cardiology

## 2017-07-11 ENCOUNTER — Ambulatory Visit (INDEPENDENT_AMBULATORY_CARE_PROVIDER_SITE_OTHER): Payer: Medicaid Other | Admitting: Cardiology

## 2017-07-11 VITALS — BP 132/72 | HR 55 | Wt 152.5 lb

## 2017-07-11 VITALS — BP 108/70 | HR 79 | Ht 66.0 in | Wt 148.8 lb

## 2017-07-11 VITALS — BP 112/66 | Ht 66.0 in | Wt 148.8 lb

## 2017-07-11 DIAGNOSIS — I255 Ischemic cardiomyopathy: Secondary | ICD-10-CM

## 2017-07-11 DIAGNOSIS — Z95810 Presence of automatic (implantable) cardiac defibrillator: Secondary | ICD-10-CM | POA: Diagnosis not present

## 2017-07-11 DIAGNOSIS — I6529 Occlusion and stenosis of unspecified carotid artery: Secondary | ICD-10-CM | POA: Diagnosis not present

## 2017-07-11 DIAGNOSIS — I10 Essential (primary) hypertension: Secondary | ICD-10-CM

## 2017-07-11 DIAGNOSIS — N182 Chronic kidney disease, stage 2 (mild): Secondary | ICD-10-CM | POA: Diagnosis not present

## 2017-07-11 DIAGNOSIS — Z7982 Long term (current) use of aspirin: Secondary | ICD-10-CM | POA: Insufficient documentation

## 2017-07-11 DIAGNOSIS — I25709 Atherosclerosis of coronary artery bypass graft(s), unspecified, with unspecified angina pectoris: Secondary | ICD-10-CM

## 2017-07-11 DIAGNOSIS — Z8249 Family history of ischemic heart disease and other diseases of the circulatory system: Secondary | ICD-10-CM | POA: Diagnosis not present

## 2017-07-11 DIAGNOSIS — E1122 Type 2 diabetes mellitus with diabetic chronic kidney disease: Secondary | ICD-10-CM | POA: Diagnosis not present

## 2017-07-11 DIAGNOSIS — Q240 Dextrocardia: Secondary | ICD-10-CM | POA: Insufficient documentation

## 2017-07-11 DIAGNOSIS — I25118 Atherosclerotic heart disease of native coronary artery with other forms of angina pectoris: Secondary | ICD-10-CM

## 2017-07-11 DIAGNOSIS — E785 Hyperlipidemia, unspecified: Secondary | ICD-10-CM

## 2017-07-11 DIAGNOSIS — I5022 Chronic systolic (congestive) heart failure: Secondary | ICD-10-CM | POA: Insufficient documentation

## 2017-07-11 DIAGNOSIS — Z794 Long term (current) use of insulin: Secondary | ICD-10-CM | POA: Diagnosis not present

## 2017-07-11 DIAGNOSIS — Z9889 Other specified postprocedural states: Secondary | ICD-10-CM | POA: Diagnosis not present

## 2017-07-11 DIAGNOSIS — Z809 Family history of malignant neoplasm, unspecified: Secondary | ICD-10-CM | POA: Insufficient documentation

## 2017-07-11 DIAGNOSIS — Z823 Family history of stroke: Secondary | ICD-10-CM | POA: Diagnosis not present

## 2017-07-11 DIAGNOSIS — I2510 Atherosclerotic heart disease of native coronary artery without angina pectoris: Secondary | ICD-10-CM | POA: Insufficient documentation

## 2017-07-11 DIAGNOSIS — E1165 Type 2 diabetes mellitus with hyperglycemia: Secondary | ICD-10-CM | POA: Diagnosis not present

## 2017-07-11 DIAGNOSIS — Z79899 Other long term (current) drug therapy: Secondary | ICD-10-CM | POA: Insufficient documentation

## 2017-07-11 DIAGNOSIS — Z8673 Personal history of transient ischemic attack (TIA), and cerebral infarction without residual deficits: Secondary | ICD-10-CM | POA: Diagnosis not present

## 2017-07-11 DIAGNOSIS — K219 Gastro-esophageal reflux disease without esophagitis: Secondary | ICD-10-CM | POA: Diagnosis not present

## 2017-07-11 DIAGNOSIS — I130 Hypertensive heart and chronic kidney disease with heart failure and stage 1 through stage 4 chronic kidney disease, or unspecified chronic kidney disease: Secondary | ICD-10-CM | POA: Insufficient documentation

## 2017-07-11 DIAGNOSIS — Z951 Presence of aortocoronary bypass graft: Secondary | ICD-10-CM | POA: Insufficient documentation

## 2017-07-11 DIAGNOSIS — Z5189 Encounter for other specified aftercare: Secondary | ICD-10-CM | POA: Diagnosis present

## 2017-07-11 DIAGNOSIS — E1152 Type 2 diabetes mellitus with diabetic peripheral angiopathy with gangrene: Secondary | ICD-10-CM | POA: Diagnosis not present

## 2017-07-11 DIAGNOSIS — I63232 Cerebral infarction due to unspecified occlusion or stenosis of left carotid arteries: Secondary | ICD-10-CM | POA: Diagnosis not present

## 2017-07-11 DIAGNOSIS — I480 Paroxysmal atrial fibrillation: Secondary | ICD-10-CM | POA: Diagnosis not present

## 2017-07-11 LAB — CUP PACEART INCLINIC DEVICE CHECK
Battery Remaining Longevity: 128 mo
Brady Statistic RV Percent Paced: 0.01 %
HIGH POWER IMPEDANCE MEASURED VALUE: 63 Ohm
Implantable Lead Implant Date: 20170808
Implantable Lead Location: 753860
Implantable Pulse Generator Implant Date: 20170808
Lead Channel Impedance Value: 342 Ohm
Lead Channel Impedance Value: 418 Ohm
Lead Channel Sensing Intrinsic Amplitude: 15.375 mV
Lead Channel Sensing Intrinsic Amplitude: 16.875 mV
Lead Channel Setting Sensing Sensitivity: 0.3 mV
MDC IDC MSMT BATTERY VOLTAGE: 3.02 V
MDC IDC MSMT LEADCHNL RV PACING THRESHOLD AMPLITUDE: 0.5 V
MDC IDC MSMT LEADCHNL RV PACING THRESHOLD PULSEWIDTH: 0.4 ms
MDC IDC SESS DTM: 20180809114059
MDC IDC SET LEADCHNL RV PACING AMPLITUDE: 2.5 V
MDC IDC SET LEADCHNL RV PACING PULSEWIDTH: 0.4 ms

## 2017-07-11 MED ORDER — SACUBITRIL-VALSARTAN 97-103 MG PO TABS: 1.0000 | ORAL_TABLET | Freq: Two times a day (BID) | ORAL | 3 refills | Status: DC

## 2017-07-11 MED ORDER — FUROSEMIDE 40 MG PO TABS: 40.0000 mg | ORAL_TABLET | Freq: Every day | ORAL | 8 refills | Status: DC

## 2017-07-11 NOTE — Patient Instructions (Signed)
INCREASE Entresto to 97/103mg  twice daily  DECREASE Lasix to 40mg  daily.  Follow up with Dr.Bensimhon
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
## 2017-07-11 NOTE — Patient Instructions (Signed)
Medication Instructions:  None  Labwork: BMET, CBC, Liver and Lipid today  Testing/Procedures: None  Follow-Up: Your physician wants you to follow-up in: 9-12 months with Dr. Katrinka Blazing.  You will receive a reminder letter in the mail two months in advance. If you don't receive a letter, please call our office to schedule the follow-up appointment.   Any Other Special Instructions Will Be Listed Below (If Applicable).     If you need a refill on your cardiac medications before your next appointment, please call your pharmacy.

## 2017-07-11 NOTE — Patient Instructions (Signed)
Your physician wants you to follow-up in: ONE YEAR WITH DR CAMNITZ You will receive a reminder letter in the mail two months in advance. If you don't receive a letter, please call our office to schedule the follow-up appointment.  
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
## 2017-07-12 LAB — LIPID PANEL
CHOLESTEROL TOTAL: 115 mg/dL (ref 100–199)
Chol/HDL Ratio: 3.1 ratio (ref 0.0–5.0)
HDL: 37 mg/dL — ABNORMAL LOW (ref >39)
LDL CALC: 49 mg/dL (ref 0–99)
TRIGLYCERIDES: 145 mg/dL (ref 0–149)
VLDL CHOLESTEROL CAL: 29 mg/dL (ref 5–40)

## 2017-07-12 LAB — BASIC METABOLIC PANEL
BUN / CREAT RATIO: 20 (ref 9–20)
BUN: 28 mg/dL — ABNORMAL HIGH (ref 6–24)
CHLORIDE: 100 mmol/L (ref 96–106)
CO2: 24 mmol/L (ref 20–29)
Calcium: 9 mg/dL (ref 8.7–10.2)
Creatinine, Ser: 1.39 mg/dL — ABNORMAL HIGH (ref 0.76–1.27)
GFR calc Af Amer: 64 mL/min/{1.73_m2} (ref >59)
GFR calc non Af Amer: 55 mL/min/{1.73_m2} — ABNORMAL LOW (ref >59)
GLUCOSE: 428 mg/dL — AB (ref 65–99)
Potassium: 5.1 mmol/L (ref 3.5–5.2)
SODIUM: 138 mmol/L (ref 134–144)

## 2017-07-12 LAB — HEPATIC FUNCTION PANEL
ALT: 6 IU/L (ref 0–44)
AST: 11 IU/L (ref 0–40)
Albumin: 3.7 g/dL (ref 3.5–5.5)
Alkaline Phosphatase: 108 IU/L (ref 39–117)
Bilirubin Total: 0.2 mg/dL (ref 0.0–1.2)
Bilirubin, Direct: 0.08 mg/dL (ref 0.00–0.40)
TOTAL PROTEIN: 6.3 g/dL (ref 6.0–8.5)

## 2017-07-12 LAB — HEMOGLOBIN A1C
Est. average glucose Bld gHb Est-mCnc: 295 mg/dL
Hgb A1c MFr Bld: 11.9 % — ABNORMAL HIGH (ref 4.8–5.6)

## 2017-07-24 ENCOUNTER — Other Ambulatory Visit: Payer: Self-pay | Admitting: Internal Medicine

## 2017-08-03 ENCOUNTER — Other Ambulatory Visit: Payer: Self-pay | Admitting: Interventional Cardiology

## 2017-08-03 DIAGNOSIS — Z95810 Presence of automatic (implantable) cardiac defibrillator: Secondary | ICD-10-CM

## 2017-08-03 DIAGNOSIS — I25810 Atherosclerosis of coronary artery bypass graft(s) without angina pectoris: Secondary | ICD-10-CM

## 2017-08-03 DIAGNOSIS — I10 Essential (primary) hypertension: Secondary | ICD-10-CM

## 2017-08-03 DIAGNOSIS — Q240 Dextrocardia: Secondary | ICD-10-CM

## 2017-08-03 DIAGNOSIS — I5022 Chronic systolic (congestive) heart failure: Secondary | ICD-10-CM

## 2017-08-03 DIAGNOSIS — I63232 Cerebral infarction due to unspecified occlusion or stenosis of left carotid arteries: Secondary | ICD-10-CM

## 2017-08-13 ENCOUNTER — Encounter: Payer: Self-pay | Admitting: Interventional Cardiology

## 2017-08-13 ENCOUNTER — Encounter: Payer: Medicaid Other | Admitting: *Deleted

## 2017-08-13 ENCOUNTER — Telehealth: Payer: Self-pay | Admitting: Cardiology

## 2017-08-13 NOTE — Telephone Encounter (Signed)
Attempted to confirm remote transmission with pt. No answer and was unable to leave a message.   

## 2017-08-16 ENCOUNTER — Encounter: Payer: Self-pay | Admitting: Cardiology

## 2017-08-16 NOTE — Progress Notes (Signed)
No ICM remote transmission received for 08/13/2017 and next ICM transmission scheduled for 09/16/2017.

## 2017-08-23 ENCOUNTER — Other Ambulatory Visit: Payer: Self-pay | Admitting: Interventional Cardiology

## 2017-08-23 DIAGNOSIS — I5022 Chronic systolic (congestive) heart failure: Secondary | ICD-10-CM

## 2017-08-23 DIAGNOSIS — Z95810 Presence of automatic (implantable) cardiac defibrillator: Secondary | ICD-10-CM

## 2017-08-23 DIAGNOSIS — I63232 Cerebral infarction due to unspecified occlusion or stenosis of left carotid arteries: Secondary | ICD-10-CM

## 2017-08-23 DIAGNOSIS — I25810 Atherosclerosis of coronary artery bypass graft(s) without angina pectoris: Secondary | ICD-10-CM

## 2017-08-23 DIAGNOSIS — Q240 Dextrocardia: Secondary | ICD-10-CM

## 2017-08-23 DIAGNOSIS — I10 Essential (primary) hypertension: Secondary | ICD-10-CM

## 2017-09-02 ENCOUNTER — Telehealth: Payer: Self-pay | Admitting: *Deleted

## 2017-09-02 NOTE — Telephone Encounter (Signed)
Will route to Dr. Katrinka Blazing for clarification on which dose pt should be on?  I don't see where Carvedilol was decreased at visit with Katrinka Blazing or Camnitz.

## 2017-09-02 NOTE — Telephone Encounter (Signed)
Patient called to inquire about a refill request on carvedilol 3.125 mg that he states that his pharmacy requested from [place] weeks ago. It was refused twice with a reason of medication requested was wrong dose. Patient also sent a mychart message but he never responded to my question back to him in regards to his dose. Patient stated that he takes 3.125 mg bid but his current med list has 6.25 mg bid listed. He stated that Dr Katrinka Blazing reduced his dose at his last office visit but I do not see an order for this. Please advise. Thanks, MI

## 2017-09-04 NOTE — Telephone Encounter (Signed)
I don't see documentation that carvedilol dose is been decreased to 3.125 mg twice a day. If he has been taking 3.125 mg twice a day, I would recommend refilling the medication at that dose. Let him know that both Camnitz and I thought he was taking 6.25 mg twice a day. Document the change in the patient's medical records/Medication list

## 2017-09-06 MED ORDER — CARVEDILOL 3.125 MG PO TABS: 3.1250 mg | ORAL_TABLET | Freq: Two times a day (BID) | ORAL | 3 refills | Status: DC

## 2017-09-06 NOTE — Telephone Encounter (Signed)
Clarified with pt that he has been taking Coreg 3.125mg  BID x 1 month.  Pt states that is what he was told to do last time he was seen.  Advised I would send in a new prescription.  Advised pt to call if he starts to have any trouble on lower dose.  Pt verbalized understanding and was in agreement with this plan.

## 2017-09-16 ENCOUNTER — Ambulatory Visit (INDEPENDENT_AMBULATORY_CARE_PROVIDER_SITE_OTHER): Payer: Medicaid Other

## 2017-09-16 ENCOUNTER — Ambulatory Visit (INDEPENDENT_AMBULATORY_CARE_PROVIDER_SITE_OTHER): Payer: Medicaid Other | Admitting: Neurology

## 2017-09-16 ENCOUNTER — Telehealth: Payer: Self-pay | Admitting: Cardiology

## 2017-09-16 ENCOUNTER — Encounter: Payer: Self-pay | Admitting: Neurology

## 2017-09-16 VITALS — BP 161/92 | HR 83 | Ht 66.0 in | Wt 154.0 lb

## 2017-09-16 DIAGNOSIS — I5022 Chronic systolic (congestive) heart failure: Secondary | ICD-10-CM

## 2017-09-16 DIAGNOSIS — I25709 Atherosclerosis of coronary artery bypass graft(s), unspecified, with unspecified angina pectoris: Secondary | ICD-10-CM

## 2017-09-16 DIAGNOSIS — I6522 Occlusion and stenosis of left carotid artery: Secondary | ICD-10-CM | POA: Diagnosis not present

## 2017-09-16 DIAGNOSIS — I6521 Occlusion and stenosis of right carotid artery: Secondary | ICD-10-CM

## 2017-09-16 DIAGNOSIS — Z95810 Presence of automatic (implantable) cardiac defibrillator: Secondary | ICD-10-CM

## 2017-09-16 DIAGNOSIS — Q240 Dextrocardia: Secondary | ICD-10-CM

## 2017-09-16 NOTE — Patient Instructions (Addendum)
-   continue ASA and plavix and lipitor for stroke and cardiac prevention - recommend ASA EC instead of ASA.  - continue to follow up with cardiology and establish cardiology in [place] and consider to repeat heart ultrasound once a year to monitor heart function.  - follow up with ICD. If afib found in the future, recommend stronger blood thinner.  - follow up with rheumatology as scheduled  - Follow up with your primary care physician for stroke risk factor modification. Recommend maintain blood pressure goal 120-140/80, diabetes with hemoglobin A1c goal below 7.0% and lipids with LDL cholesterol goal below 70 mg/dL.  - check BP and glucose at home. 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  - healthy diet and regular exercise  - follow up as needed.

## 2017-09-16 NOTE — Telephone Encounter (Signed)
Confirmed remote transmission w/ pt wife.   

## 2017-09-16 NOTE — Progress Notes (Signed)
STROKE NEUROLOGY FOLLOW UP NOTE  NAME: Travis Tran DOB: 13-Feb-1956  REASON FOR VISIT: stroke follow up HISTORY FROM: no one  Today we had the pleasure of seeing Travis Tran in follow-up at our Neurology Clinic. Pt was accompanied by pt and chart.   History Summary Travis Tran is a 61 y.o. male with history of situs inversus, DM, HTN, CAD, cardiomyopathy, HLD, s/p CABG x5, and a recent diagnosis of CHF w/known EF 10% was admitted on 03/20/16 for motor aphasia with garbled speech and R sided weakness and numbness. Symptoms resolved. MRI showed no acute infarct. CTA head and neck showed left ICA occlusion with some reconstitution at terminus. Right ICA origin 65%. Cerebral angio showed left ICA occlusion with right ICA 80-85% stenosis at petrous portion. CUS right ICA 60-79% stenosis. EF 15%, LDL 120 and A1C 15.1. His left brain TIA was considered due to left ICA occlusion and right ICA high grade stenosis in the setting of low BP (SBP 90-100s). He received right ICA intracranial stent. His BP still low at 100s post procedure due to multiple BP meds for his heart failure. Recommended BP goal 120-140 and compression socks. He was discharged on ASA and plavix and high dose lipitor.   06/13/16 follow up - the patient has been doing well. No stroke like symptoms. He follows with cardiology Dr. Katrinka Blazing and repeat TTE in 05/2016 showed EF 20%. He was recently referred to Dr. Elberta Fortis for ICD placement. Pt is yet to make up his decision. BP today 115/65. He is on coreg, lasix, spironolactone, and lisinopril. He feels some lightheadedness soon after his BP medication in the morning but not severe. He is continued on ASA and plavix and lipitor    09/19/16 follow up - pt has been doing well. He followed with cardiology and had ICD placement on 07/10/16 for low EF. Before the procedure his plavix was not refilled and post op his plavix was not restarted. However, he still on ASA and lipitor daily. Repeat CTA  head and neck in 06/2016 showed patent petrous ICA stent. Pt otherwise no complains, on lantus, glucose 150-250 at home. BP toady 119/72. Has not see Dr. Corliss Skains yet since the ICA stent.   02/06/17 follow up - he has been doing well. He saw Dr. Corliss Skains in 10/2016 and complained of right foot intermittent dragging, and right eye blurry vision. Repeat CTA head and neck showed patent stent and unchanged carotid stenosis as before. He also follows with cardiology and ICD works well. Follows with rheumatology for arthritis and about to start po immunotherapy. His BP improved, at home 110-115, today 129/73 in clinic. Glucose around 150-200 at home, on lantus and novolog. Pt recently moved to [place] but still would like to continue following up here. Complains of intermittent right shoulder and neck pain, mild. He is asking whether it is related to his ICD which is on the right chest. I told him I can not exclude that possibility, but it most commonly associated with neck DJD.   Interval History During the interval time, pt has been doing well. Follow up with cardiology in 07/2017, found to have a 6 min arrhthymia questionable A. Fib vs. SVT/PVCs. No AC offered. Plan to watch, if further afib episode found, he needs AC. BP at home 130-140, today in clinic 131/92. Glucose at home still high, around 150-200, however, stated that if glucose less than 150, he will have hypoglycemic symptoms. As per patient last A1c 12.1.  Continued on aspirin and Lipitor and Plavix, no side effects.  REVIEW OF SYSTEMS: Full 14 system review of systems performed and notable only for those listed below and in HPI above, all others are negative:  Constitutional:   Cardiovascular:  Ear/Nose/Throat:   Skin:  Eyes:  Eye itching, light sensitivity Respiratory:   Gastroitestinal:   Genitourinary:  Hematology/Lymphatic:   Endocrine:  Musculoskeletal:   Allergy/Immunology:   Neurological:   Psychiatric:  Sleep:   The  following represents the patient's updated allergies and side effects list: Allergies  Allergen Reactions  . Celecoxib Swelling  . Sulfa Antibiotics Other (See Comments)    unknown    The neurologically relevant items on the patient's problem list were reviewed on today's visit.  Neurologic Examination  A problem focused neurological exam (12 or more points of the single system neurologic examination, vital signs counts as 1 point, cranial nerves count for 8 points) was performed.  Blood pressure (!) 161/92, pulse 83, height  (1.676 m), weight 154 lb (69.9 kg).  General - Well nourished, well developed, in no apparent distress.  Ophthalmologic - Sharp disc margins OU.   Cardiovascular - Regular rate and rhythm with no murmur.  musculoskeletal - bilateral hand joint deformities, R>L.  Mental Status -  Level of arousal and orientation to time, place, and person were intact. Language including expression, naming, repetition, comprehension was assessed and found intact. Fund of Knowledge was assessed and was intact.  Cranial Nerves II - XII - II - Visual field intact OU. III, IV, VI - Extraocular movements intact. V - Facial sensation intact bilaterally. VII - Facial movement intact bilaterally. VIII - Hearing & vestibular intact bilaterally. X - Palate elevates symmetrically. XI - Chin turning & shoulder shrug intact bilaterally. XII - Tongue protrusion intact.  Motor Strength - The patient's strength was normal in all extremities and pronator drift was absent.  Bulk was normal and fasciculations were absent.   Motor Tone - Muscle tone was assessed at the neck and appendages and was normal.  Reflexes - The patient's reflexes were 1+ in all extremities and he had no pathological reflexes.  Sensory - Light touch, temperature/pinprick, vibration and proprioception, and Romberg testing were assessed and were normal.    Coordination - The patient had normal movements in the  hands and feet with no ataxia or dysmetria.  Tremor was absent.  Gait and Station - mild left hemiparetic gait with gait initiation.    Data reviewed: I personally reviewed the images and agree with the radiology interpretations.  Ct Head Wo Contrast 03/20/2016 No acute intracranial abnormalities. Mild RIGHT maxillary sinus disease.   Ct Angio Head & Neck W/cm &/or Wo/cm 03/20/2016 1. 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 2. 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 Reconstituted flow at the left ICA terminus such that the left MCA and ACA origins remain normal. 3. Very advanced for age atherosclerosis in the head and neck. Additional atherosclerotic moderate or severe stenosis: - Right ICA origin (65%), - Right ICA siphon, - Left Vertebral artery V4 segment, - Right PCA P2 segment (moderate) - Left ACA callosomarginal artery origin. 4. No cortically based acute infarct identified.   Mr Brain Wo Contrast 03/20/2016 No acute infarct or intracranial hemorrhage. Abnormal appearance of the left internal carotid artery. Please see CT angiogram report. Mild chronic microvascular changes  most notable left parietal lobe. Right parietal region small arachnoid cyst may be present as noted above. Moderate mucosal thickening right maxillary sinus.   Cerebral angio 1.Occluded LT ICA at the cavernous seg at the opthalmic artery level. 2.Approx 80 % to 85% stenosis of RT ICA petrous -cavernous junction. 3.Approx 75 to 80% stenosis of LT VBJ distal to LT PICA origin.  TTE 03/21/16 - Left ventricle: The cavity size was normal. Systolic function was  normal. The estimated ejection fraction was 15%. Severe diffuse  hypokinesis with regional variations. There is akinesis of the  entireapical myocardium. There is akinesis of the  mid-apicallateral  myocardium. - Atrial septum: No defect or patent foramen ovale was identified. - Impressions: Limited study. Severe LVE. EF 15% Diffuse  hypokinesis  RV normal size  Bubble study negative for right to left shunt. Impressions: - Limited study. Severe LVE. EF 15% Diffuse hypokinesis  RV normal size  Bubble study negative for right to left shunt.  TTE 05/07/2016 - Procedure narrative: Transthoracic echocardiography. Image  quality was adequate. Intravenous contrast (Definity) was  administered. - Status, risk factors: Dextrocardia. - Left ventricle: The cavity size was normal. Wall thickness was  normal. The estimated ejection fraction was 20%. Diffuse  hypokinesis. No LV thrombus. Doppler parameters are consistent  with abnormal left ventricular relaxation (grade 1 diastolic  dysfunction). - Aortic valve: There was no stenosis. - Mitral valve: There was trivial regurgitation. - Left atrium: The atrium was mildly dilated. - Right ventricle: The cavity size was normal. Systolic function  was moderately reduced. - Right atrium: The atrium was mildly dilated. - Pulmonary arteries: No complete TR doppler jet so unable to  estimate PA systolic pressure. - Inferior vena cava: The vessel was normal in size. The  respirophasic diameter changes were in the normal range (= 50%),  consistent with normal central venous pressure. Impressions: - Dextrocardia. Normal LV size with EF 20%, diffuse hypokinesis.  Normal RV size with moderately decreased systolic function. No  significant valvular abnormalities.  CUS - - Right - 60% to 79% ICA stenosis at the bifurcation/ proximal ICA.  Moderate heterogeneous plaque. - Right - - Left - Moderate heterogeneous plaque noted in the bifurcation  with diminished velocities throughout the ICA with a abnormal  damped waveform and loss of diastolic flow suggestive of a  probable more distal obstruction. - Bilateral - Vertebral artery  flow is antegrade.  CTA head and neck 06/21/16 1. Patent stent of the right petrocavernous junction of ICA. 2. Stable 40% stenosis of right ICA origin. 3. Stable long segment of stenosis of the left internal carotid artery with probable occlusion of the terminus (possible trickle flow) without significant interval change. 4. Patent circle of Willis with left MCA distribution provided primarily by anterior communicating artery and pial collaterals. The left MCA is overall mildly diminished in comparison with the right. 5. Stable stenosis of the left vertebral artery origin partially obscured by streak and motion artifact. 6. Stable stenosis of the left proximal V4 segment. 7. No evidence of large acute infarct, mass effect, intracranial hemorrhage, or abnormal enhancement of the brain.  Ct Angio Head and neck W Or Wo Contrast 10/30/2016 IMPRESSION: 1. Patent right ICA petrocavernous junction stent. 2. Severe calcific atherosclerosis of the bilateral cavernous and paraclinoid segments. Occluded left ICA at level of distal cavernous segment. 3. Patent circle of Willis. Areas of stenosis in the anterior and posterior circulation compatible with intracranial atherosclerosis, advanced for age. 4. Stable mild 40% proximal  right ICA stenosis. 5. Stable long segment of severe stenosis of the left internal carotid artery from the origin to occlusion at the distal cavernous segment. 6. Stable moderate approximately 50% stenosis of left vertebral artery origin.   TTE 01/17/17 - Left ventricle: The cavity size was normal. Wall thickness was   increased in a pattern of mild LVH. Systolic function was normal.   The estimated ejection fraction was in the range of 50% to 55%.   Doppler parameters are consistent with abnormal left ventricular   relaxation (grade 1 diastolic dysfunction).  Component     Latest Ref Rng 02/27/2016 02/28/2016 03/23/2016 03/24/2016  Cholesterol     0 - 200 mg/dL  407      Triglycerides     <150 mg/dL  680    HDL Cholesterol     >40 mg/dL  49    Total CHOL/HDL Ratio       3.9    VLDL     0 - 40 mg/dL  22    LDL (calc)     0 - 99 mg/dL  881 (H)    Hemoglobin A1C     4.8 - 5.6 % 15.1 (H)     Mean Plasma Glucose      387     TSH     0.350 - 4.500 uIU/mL 1.446     HIV     Non Reactive Non Reactive     Platelet Function  P2Y12     194 - 418 PRU   113 (L) 85 (L)   Component     Latest Ref Rng & Units 10/19/2016  Hemoglobin A1C      13.4    Assessment: As you may recall, he is a 61 y.o. Caucasian male with PMH of situs inversus, DM, HTN, CAD, HLD, s/p CABG x5, and CHF w/known EF 10% was admitted on 03/20/16 for transient motor aphasia and R sided weakness and numbness. MRI showed no acute infarct. CTA head and neck showed left ICA occlusion. Right ICA origin 65% stenosis. Cerebral angio showed left ICA occlusion with right ICA 80-85% stenosis at petrous portion. CUS right ICA 60-79% stenosis. EF 15%, LDL 120 and A1C 15.1. His left brain TIA was considered due to left ICA occlusion and right ICA high grade stenosis in the setting of low BP (SBP 90-100s). He received right ICA intracranial stent. His BP still low at 100s post procedure due to multiple BP meds for his heart failure. Recommended BP goal 120-140 and compression socks. He was discharged on ASA and plavix and high dose lipitor. During the interval time, the patient has been doing well. Repeat TTE in 05/2016 showed EF 20%. Repeat CTA head and neck in 06/2016 showed patent right petrous ICA stent. Left ICA still occluded. He had ICD placement in 07/2016. Continued on DAPT and lipitor. Repeat CTA head and neck in 10/2016 showed no change with patent stent and 40% right ICA stenosis. Repeat TEE 01/2017 showed EF 50-55%, normalized. Pt moved to [place], [place] and doing well from stroke standpoint. ICD interrogation showed one episode of 6 min PAF vs. SVT/PVCs in 05/2017. Plan to start [facility] if PAF seen in the  future interrogations.   Plan:  - continue ASA and plavix and lipitor for stroke and cardiac prevention - recommend ASA EC instead of ASA.  - continue to follow up with cardiology and establish cardiology in [place] and consider to repeat heart ultrasound once a year to monitor heart  function.  - follow up with ICD. If afib found in the future, recommend stronger blood thinner.  - follow up with rheumatology as scheduled  - Follow up with your primary care physician for stroke risk factor modification. Recommend maintain blood pressure goal 120-140/80, diabetes with hemoglobin A1c goal below 7.0% and lipids with LDL cholesterol goal below 70 mg/dL.  - check BP and glucose at home. 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  - healthy diet and regular exercise  - follow up as needed.  I spent more than 25 minutes of face to face time with the patient. Greater than 50% of time was spent in counseling and coordination of care. We reviewed discussed BP and glucose monitoring , and follow up with cardiology and EP closely.    No orders of the defined types were placed in this encounter.   Meds ordered this encounter  Medications  . DISCONTD: buprenorphine (BUTRANS) 10 MCG/HR PTWK patch  . DISCONTD: oxyCODONE (OXY IR/ROXICODONE) 5 MG immediate release tablet    Sig: Take by mouth.  . DISCONTD: oxyCODONE-acetaminophen (PERCOCET/ROXICET) 5-325 MG tablet  . albuterol (VENTOLIN HFA) 108 (90 Base) MCG/ACT inhaler  . DISCONTD: meloxicam (MOBIC) 15 MG tablet    There are no Patient Instructions on file for this visit.  Marvel Plan, MD PhD [facility] [address], [address] [place], [place] [address] [phone]

## 2017-09-17 ENCOUNTER — Telehealth: Payer: Self-pay

## 2017-09-17 DIAGNOSIS — I5022 Chronic systolic (congestive) heart failure: Secondary | ICD-10-CM

## 2017-09-17 DIAGNOSIS — Z95810 Presence of automatic (implantable) cardiac defibrillator: Secondary | ICD-10-CM

## 2017-09-17 NOTE — Progress Notes (Addendum)
Returned call to patient as requested by voice mail.  [place]. He has gained weight in last month but does not feel it is related to fluid.  Transmission reviewed.  No changes today.  Next remote transmission 10/18/2017.

## 2017-09-17 NOTE — Progress Notes (Signed)
EPIC Encounter for ICM Monitoring  Patient Name: Travis Tran is a 61 y.o. male Date: 09/17/2017 Primary Care Physican: Maren Reamer, MD Primary Cardiologist:Smith/Bensimhon Electrophysiologist: Camnitz Dry Weight:Last weight 141 lbs       Attempted call to patient and unable to reach.  Left message to return call.  Transmission reviewed.    Thoracic impedance normal.  Prescribed dosage: Furosemide 40 mg 1 tablet daily.   Labs: 07/11/2017 Creatinine 1.39, BUN 28, Potassium 5.1, Sodium 138, EGFR 55-64 10/22/2016 Creatinine 0.99, BUN 26, Potassium 4.3, Sodium 132 07/27/2016 Creatinine 1.04, BUN 14, Potassium 3.5, Sodium 135, EGFR >60  07/05/2016 Creatinine 1.01, BUN 25, Potassium 3.9, Sodium 139  05/17/2016 Creatinine 1.03, BUN 27, Potassium 3.8, Sodium 132  03/24/2016 Creatinine 0.88, BUN 16, Potassium 4.1, Sodium 138  Recommendations: NONE - Unable to reach patient   Follow-up plan: ICM clinic phone appointment on 10/18/2017.    Copy of ICM check sent to Dr. Curt Bears.   3 month ICM trend: 09/17/2017   1 Year ICM trend:      Rosalene Billings, RN 09/17/2017 12:46 PM

## 2017-09-17 NOTE — Telephone Encounter (Signed)
Remote ICM transmission received.  Attempted call to patient and left message to return call. 

## 2017-09-28 ENCOUNTER — Other Ambulatory Visit: Payer: Self-pay | Admitting: Neurology

## 2017-09-28 DIAGNOSIS — I63232 Cerebral infarction due to unspecified occlusion or stenosis of left carotid arteries: Secondary | ICD-10-CM

## 2017-09-28 DIAGNOSIS — I25709 Atherosclerosis of coronary artery bypass graft(s), unspecified, with unspecified angina pectoris: Secondary | ICD-10-CM

## 2017-09-30 ENCOUNTER — Other Ambulatory Visit: Payer: Self-pay

## 2017-09-30 DIAGNOSIS — I25709 Atherosclerosis of coronary artery bypass graft(s), unspecified, with unspecified angina pectoris: Secondary | ICD-10-CM

## 2017-09-30 DIAGNOSIS — I63232 Cerebral infarction due to unspecified occlusion or stenosis of left carotid arteries: Secondary | ICD-10-CM

## 2017-09-30 MED ORDER — CLOPIDOGREL BISULFATE 75 MG PO TABS: 75.0000 mg | ORAL_TABLET | Freq: Every day | ORAL | 6 refills | Status: AC

## 2017-10-18 ENCOUNTER — Ambulatory Visit (INDEPENDENT_AMBULATORY_CARE_PROVIDER_SITE_OTHER): Payer: Medicaid Other

## 2017-10-18 ENCOUNTER — Telehealth: Payer: Self-pay | Admitting: Cardiology

## 2017-10-18 DIAGNOSIS — I5022 Chronic systolic (congestive) heart failure: Secondary | ICD-10-CM

## 2017-10-18 DIAGNOSIS — Z95810 Presence of automatic (implantable) cardiac defibrillator: Secondary | ICD-10-CM | POA: Diagnosis not present

## 2017-10-18 NOTE — Telephone Encounter (Signed)
Spoke with pt and reminded pt of remote transmission that is due today. Pt verbalized understanding.   

## 2017-10-18 NOTE — Progress Notes (Signed)
EPIC Encounter for ICM Monitoring  Patient Name: Travis Tran is a 61 y.o. male Date: 10/18/2017 Primary Care Physican: Maren Reamer, MD Primary Cardiologist:Smith/Bensimhon Electrophysiologist: Camnitz Dry Weight: 149lbs      Heart Failure questions reviewed, pt symptomatic with 4 lb weight gain in last 2 weeks.   Thoracic impedance abnormal suggesting fluid accumulation.  Prescribed dosage: Furosemide 40 mg 1 tablet daily.   Labs: 07/11/2017 Creatinine 1.39, BUN 28, Potassium 5.1, Sodium 138, EGFR 55-64 10/22/2016 Creatinine 0.99, BUN 26, Potassium 4.3, Sodium 132 07/27/2016 Creatinine 1.04, BUN 14, Potassium 3.5, Sodium 135, EGFR >60  07/05/2016 Creatinine 1.01, BUN 25, Potassium 3.9, Sodium 139  05/17/2016 Creatinine 1.03, BUN 27, Potassium 3.8, Sodium 132  03/24/2016 Creatinine 0.88, BUN 16, Potassium 4.1, Sodium 138  Recommendations: Advised to limit salt intake to 2000 mg/day.  Encouraged to call for fluid symptoms.  Follow-up plan: ICM clinic phone appointment on 10/22/2017 to recheck fluid levels.    Copy of ICM check sent to Dr. Curt Bears and Dr. Haroldine Laws.   3 month ICM trend: 10/18/2017    1 Year ICM trend:       Rosalene Billings, RN 10/18/2017 4:51 PM

## 2017-10-22 ENCOUNTER — Telehealth: Payer: Self-pay | Admitting: Cardiology

## 2017-10-22 ENCOUNTER — Ambulatory Visit (INDEPENDENT_AMBULATORY_CARE_PROVIDER_SITE_OTHER): Payer: Self-pay

## 2017-10-22 DIAGNOSIS — I5022 Chronic systolic (congestive) heart failure: Secondary | ICD-10-CM

## 2017-10-22 DIAGNOSIS — Z95810 Presence of automatic (implantable) cardiac defibrillator: Secondary | ICD-10-CM

## 2017-10-22 NOTE — Progress Notes (Addendum)
EPIC Encounter for ICM Monitoring  Patient Name: Travis Tran is a 61 y.o. male Date: 10/22/2017 Primary Care Physican: Maren Reamer, MD Primary Cardiologist:Smith/Bensimhon Electrophysiologist: Curt Bears Dry Weight:147lbs            Heart Failure questions reviewed, pt had loss of 2 lbs of the 4 he had gained.  He reported he feels fine.     Thoracic impedance back at baseline normal.  Prescribed dosage: Furosemide 40 mg1 tablet daily.   Labs: 07/11/2017 Creatinine 1.39, BUN 28, Potassium 5.1, Sodium 138, EGFR 55-64 10/22/2016 Creatinine 0.99, BUN 26, Potassium 4.3, Sodium 132 07/27/2016 Creatinine 1.04, BUN 14, Potassium 3.5, Sodium 135, EGFR >60  07/05/2016 Creatinine 1.01, BUN 25, Potassium 3.9, Sodium 139  05/17/2016 Creatinine 1.03, BUN 27, Potassium 3.8, Sodium 132  03/24/2016 Creatinine 0.88, BUN 16, Potassium 4.1, Sodium 138  Recommendations: No changes.  Advised to limit salt intake to 2000 mg/day and fluid intake to < 2 liters/day.  Encouraged to call for fluid symptoms.  Follow-up plan: ICM clinic phone appointment on 11/21/2017.    Copy of ICM check sent to Dr. Curt Bears.   3 month ICM trend: 10/22/2017    1 Year ICM trend:       Rosalene Billings, RN 10/22/2017 1:42 PM

## 2017-10-22 NOTE — Telephone Encounter (Signed)
Spoke with pt and reminded pt of remote transmission that is due today. Pt verbalized understanding.   

## 2017-11-21 ENCOUNTER — Ambulatory Visit (INDEPENDENT_AMBULATORY_CARE_PROVIDER_SITE_OTHER): Payer: Medicaid Other

## 2017-11-21 DIAGNOSIS — Z95810 Presence of automatic (implantable) cardiac defibrillator: Secondary | ICD-10-CM

## 2017-11-21 DIAGNOSIS — I5022 Chronic systolic (congestive) heart failure: Secondary | ICD-10-CM | POA: Diagnosis not present

## 2017-11-22 NOTE — Progress Notes (Signed)
EPIC Encounter for ICM Monitoring  Patient Name: Travis Tran is a 61 y.o. male Date: 11/22/2017 Primary Care Physican: Langeland, Dawn T, MD (Inactive) Primary Cardiologist:Smith/Bensimhon Electrophysiologist: Camnitz Dry Weight: Previous weight 147lbs       Transmission reviewed   Thoracic impedance normal.  Prescribed dosage: Furosemide 40 mg1 tablet daily.   Labs: 07/11/2017 Creatinine 1.39, BUN 28, Potassium 5.1, Sodium 138, EGFR 55-64 10/22/2016 Creatinine 0.99, BUN 26, Potassium 4.3, Sodium 132 07/27/2016 Creatinine 1.04, BUN 14, Potassium 3.5, Sodium 135, EGFR >60  07/05/2016 Creatinine 1.01, BUN 25, Potassium 3.9, Sodium 139  05/17/2016 Creatinine 1.03, BUN 27, Potassium 3.8, Sodium 132  03/24/2016 Creatinine 0.88, BUN 16, Potassium 4.1, Sodium 138  Recommendations: None  Follow-up plan: ICM clinic phone appointment on 12/23/2017.    Copy of ICM check sent to Dr. Camnitz.   3 month ICM trend: 11/21/2017    1 Year ICM trend:       Laurie S Short, RN 11/22/2017 12:54 PM   

## 2017-12-23 ENCOUNTER — Ambulatory Visit (INDEPENDENT_AMBULATORY_CARE_PROVIDER_SITE_OTHER): Payer: Medicaid Other

## 2017-12-23 ENCOUNTER — Telehealth: Payer: Self-pay | Admitting: Cardiology

## 2017-12-23 DIAGNOSIS — I5022 Chronic systolic (congestive) heart failure: Secondary | ICD-10-CM

## 2017-12-23 DIAGNOSIS — Z95810 Presence of automatic (implantable) cardiac defibrillator: Secondary | ICD-10-CM | POA: Diagnosis not present

## 2017-12-23 NOTE — Telephone Encounter (Signed)
Spoke with pt and reminded pt of remote transmission that is due today. Pt verbalized understanding.   

## 2017-12-24 NOTE — Progress Notes (Signed)
EPIC Encounter for ICM Monitoring  Patient Name: Travis Tran is a 62 y.o. male Date: 12/24/2017 Primary Care Physican: Maren Reamer, MD (Inactive) Primary Cardiologist:Smith/Bensimhon Electrophysiologist: Camnitz Dry Weight: 148lbs      Heart Failure questions reviewed, pt asymptomatic.   Thoracic impedance normal.  Prescribed dosage: Furosemide 40 mg1 tablet daily.   Labs: 07/11/2017 Creatinine 1.39, BUN 28, Potassium 5.1, Sodium 138, EGFR 55-64 10/22/2016 Creatinine 0.99, BUN 26, Potassium 4.3, Sodium 132 07/27/2016 Creatinine 1.04, BUN 14, Potassium 3.5, Sodium 135, EGFR >60  07/05/2016 Creatinine 1.01, BUN 25, Potassium 3.9, Sodium 139  05/17/2016 Creatinine 1.03, BUN 27, Potassium 3.8, Sodium 132  03/24/2016 Creatinine 0.88, BUN 16, Potassium 4.1, Sodium 138  Recommendations: No changes.   Encouraged to call for fluid symptoms.  Follow-up plan: ICM clinic phone appointment on 01/24/2018.    Copy of ICM check sent to Dr. Curt Bears.   3 month ICM trend: 12/24/2017    1 Year ICM trend:       Rosalene Billings, RN 12/24/2017 9:42 AM

## 2018-01-24 ENCOUNTER — Ambulatory Visit (INDEPENDENT_AMBULATORY_CARE_PROVIDER_SITE_OTHER): Payer: Medicaid Other

## 2018-01-24 DIAGNOSIS — Z95810 Presence of automatic (implantable) cardiac defibrillator: Secondary | ICD-10-CM

## 2018-01-24 DIAGNOSIS — I5022 Chronic systolic (congestive) heart failure: Secondary | ICD-10-CM | POA: Diagnosis not present

## 2018-01-24 NOTE — Progress Notes (Signed)
EPIC Encounter for ICM Monitoring  Patient Name: Travis Tran is a 62 y.o. male Date: 01/24/2018 Primary Care Physican: Maren Reamer, MD (Inactive) Primary Cardiologist:Smith/Bensimhon Electrophysiologist: Camnitz Dry Weight:148lbs                                                Spoke to sister Estill [place], [place].  Heart Failure questions reviewed, pt asymptomatic.   Thoracic impedance normal.  Prescribed dosage: Furosemide 40 mg1 tablet daily.   Labs: 07/11/2017 Creatinine 1.39, BUN 28, Potassium 5.1, Sodium 138, EGFR 55-64 10/22/2016 Creatinine 0.99, BUN 26, Potassium 4.3, Sodium 132 07/27/2016 Creatinine 1.04, BUN 14, Potassium 3.5, Sodium 135, EGFR >60  07/05/2016 Creatinine 1.01, BUN 25, Potassium 3.9, Sodium 139  05/17/2016 Creatinine 1.03, BUN 27, Potassium 3.8, Sodium 132  03/24/2016 Creatinine 0.88, BUN 16, Potassium 4.1, Sodium 138  Recommendations: No changes.   Encouraged to call for fluid symptoms.  Follow-up plan: ICM clinic phone appointment on 02/24/2018. .  Copy of ICM check sent to Dr. Curt Bears.    3 month ICM trend: 01/24/2018     1 Year ICM trend:       Rosalene Billings, RN 01/24/2018 5:10 PM

## 2018-02-07 ENCOUNTER — Ambulatory Visit (INDEPENDENT_AMBULATORY_CARE_PROVIDER_SITE_OTHER): Payer: Self-pay

## 2018-02-07 ENCOUNTER — Telehealth: Payer: Self-pay | Admitting: *Deleted

## 2018-02-07 DIAGNOSIS — I5022 Chronic systolic (congestive) heart failure: Secondary | ICD-10-CM

## 2018-02-07 DIAGNOSIS — Z95810 Presence of automatic (implantable) cardiac defibrillator: Secondary | ICD-10-CM

## 2018-02-07 MED ORDER — FUROSEMIDE 40 MG PO TABS: ORAL_TABLET | ORAL | 3 refills | Status: DC

## 2018-02-07 NOTE — Progress Notes (Signed)
Spoke with Dr Elberta Fortis and call back to patient. Advised Dr Elberta Fortis recommneded to increase Furosemide 40 mg to twice a day through 02/11/2018 and then 02/12/2018 return to maintenance dosage of 40 mg 1 tablet daily.  Advised if symptoms resolve before 02/11/2018 then return back to his normal dosage of 40 mg 1 tablet daily.  He verbalized understanding.

## 2018-02-07 NOTE — Progress Notes (Signed)
EPIC Encounter for ICM Monitoring  Patient Name: Travis Tran is a 62 y.o. male Date: 02/07/2018 Primary Care Physican: Maren Reamer, MD (Inactive) Primary Cardiologist:Smith/Bensimhon Electrophysiologist: Camnitz Dry Weight:152lbs      Patient called and reported 4 lb weight gain this week with ankle swelling.  Baseline line weight is 146-148 lbs and today he is 152 lbs.  Remote transmission received.    Thoracic impedance abnormal suggesting fluid accumulation.  Prescribed dosage: Furosemide 40 mg1 tablet daily.   Labs: 11/07/2017 Creatinine 1.34, BUN 28, Potassium 4.7, Sodium 134, EGFR 58 Care Everywhere 07/11/2017 Creatinine 1.39, BUN 28, Potassium 5.1, Sodium 138, EGFR 55-64 10/22/2016 Creatinine 0.99, BUN 26, Potassium 4.3, Sodium 132 07/27/2016 Creatinine 1.04, BUN 14, Potassium 3.5, Sodium 135, EGFR >60  07/05/2016 Creatinine 1.01, BUN 25, Potassium 3.9, Sodium 139  05/17/2016 Creatinine 1.03, BUN 27, Potassium 3.8, Sodium 132  03/24/2016 Creatinine 0.88, BUN 16, Potassium 4.1, Sodium 138  Recommendations:  Advised patient Dr Curt Bears is in the office today and will have him review for recommendations.  Patient is asking for new script of Furosemide and wants a extra tablets since he occasionally needs to increase it for a few days.    Follow-up plan: ICM clinic phone appointment on 02/11/2018 to recheck fluid levels.   Copy of ICM check sent to Dr. Curt Bears and Dr. Tamala Julian.   3 month ICM trend: 02/07/2018    1 Year ICM trend:       Rosalene Billings, RN 02/07/2018 4:15 PM

## 2018-02-07 NOTE — Telephone Encounter (Signed)
Call back to patient.  Patient is having fluid symptoms.  Advised to send remote transmission.  See ICM note for symptoms and review of transmission.

## 2018-02-07 NOTE — Telephone Encounter (Signed)
Patient is calling to see if he should go back up on his Furosemide for the swelling in his ankles. He says his rx was cut by Nurse Randon Goldsmith and wanted to know if he is able to go back to his original strength.  He is a patient of Bensimhon, Camnitz, and Smith. Please Advise

## 2018-02-24 ENCOUNTER — Ambulatory Visit (INDEPENDENT_AMBULATORY_CARE_PROVIDER_SITE_OTHER): Payer: Medicaid Other

## 2018-02-24 DIAGNOSIS — I5022 Chronic systolic (congestive) heart failure: Secondary | ICD-10-CM | POA: Diagnosis not present

## 2018-02-24 DIAGNOSIS — Z95810 Presence of automatic (implantable) cardiac defibrillator: Secondary | ICD-10-CM | POA: Diagnosis not present

## 2018-02-24 NOTE — Progress Notes (Signed)
EPIC Encounter for ICM Monitoring  Patient Name: Travis Tran is a 62 y.o. male Date: 02/24/2018 Primary Care Physican: Maren Reamer, MD (Inactive) Primary Cardiologist:Smith/Bensimhon Electrophysiologist: Camnitz Dry Weight:152lbs      Heart Failure questions reviewed, pt asymptomatic.   Thoracic impedance starting to trend below baseline today.  Prescribed dosage: Furosemide 40 mg1 tablet daily.   Labs: 11/07/2017 Creatinine 1.34, BUN 28, Potassium 4.7, Sodium 134, EGFR 58 Care Everywhere 07/11/2017 Creatinine 1.39, BUN 28, Potassium 5.1, Sodium 138, EGFR 55-64  Recommendations: Advised to limit salt intake to 2000 mg/day and fluid intake to < 2 liters/day.  Encouraged to call for fluid symptoms.  Follow-up plan: ICM clinic phone appointment on 03/27/2018.   Copy of ICM check sent to Dr. Audrie Gallus and Dr. Haroldine Laws.   3 month ICM trend: 02/24/2018    1 Year ICM trend:       Rosalene Billings, RN 02/24/2018 12:26 PM

## 2018-03-10 ENCOUNTER — Other Ambulatory Visit: Payer: Self-pay | Admitting: Interventional Cardiology

## 2018-03-17 ENCOUNTER — Other Ambulatory Visit (HOSPITAL_COMMUNITY): Payer: Self-pay | Admitting: Internal Medicine

## 2018-03-18 MED ORDER — SACUBITRIL-VALSARTAN 97-103 MG PO TABS: 1.0000 | ORAL_TABLET | Freq: Two times a day (BID) | ORAL | 0 refills | Status: DC

## 2018-03-24 ENCOUNTER — Telehealth (HOSPITAL_COMMUNITY): Payer: Self-pay | Admitting: *Deleted

## 2018-03-24 ENCOUNTER — Encounter (HOSPITAL_COMMUNITY): Payer: Self-pay

## 2018-03-24 NOTE — Progress Notes (Signed)
Entresto PA submitted through [employer]. Confirmation #:[id number] Renee Harder, RN

## 2018-03-24 NOTE — Telephone Encounter (Signed)
Entresto PA approved 03/20/18-03/15/19.   PA# [id number]

## 2018-03-27 ENCOUNTER — Ambulatory Visit (INDEPENDENT_AMBULATORY_CARE_PROVIDER_SITE_OTHER): Payer: Medicaid Other

## 2018-03-27 DIAGNOSIS — I5022 Chronic systolic (congestive) heart failure: Secondary | ICD-10-CM

## 2018-03-27 DIAGNOSIS — Z95810 Presence of automatic (implantable) cardiac defibrillator: Secondary | ICD-10-CM

## 2018-03-28 NOTE — Progress Notes (Signed)
EPIC Encounter for ICM Monitoring  Patient Name: Travis Tran is a 62 y.o. male Date: 03/28/2018 Primary Care Physican: Maren Reamer, MD (Inactive) Primary Cardiologist:Smith/Bensimhon Electrophysiologist: Camnitz Dry Weight:150lbs      Heart Failure questions reviewed, pt asymptomatic.  He has been eating at restaurants more than he should. He will cut back on eating out.    Thoracic impedance abnormal suggesting fluid accumulation since 03/19/2018 with exception of one day at baseline.  Prescribed dosage: Furosemide 40 mg1 tablet daily.   Labs: 11/07/2017 Creatinine 1.34, BUN 28, Potassium 4.7, Sodium 134, EGFR 58 Care Everywhere 07/11/2017 Creatinine 1.39, BUN 28, Potassium 5.1, Sodium 138, EGFR 55-64  Recommendations: No changes.  Reinforced sodium restriction.  Encouraged to call for fluid symptoms.  Follow-up plan: ICM clinic phone appointment on 04/14/2018 to recheck fluid levels.  Office appointment scheduled 04/15/2018 with Cecilie Kicks NP and 05/20/2018 with Dr Haroldine Laws.  Copy of ICM check sent to Dr. Curt Bears and Dr. Haroldine Laws.   3 month ICM trend: 03/27/2018   1 Year ICM trend:       Rosalene Billings, RN 03/28/2018 9:08 AM

## 2018-03-31 ENCOUNTER — Telehealth (HOSPITAL_COMMUNITY): Payer: Self-pay | Admitting: *Deleted

## 2018-03-31 NOTE — Telephone Encounter (Signed)
Sherryll Burger PA# [id number] approved 03/24/18 through 06/22/2018.

## 2018-04-14 ENCOUNTER — Ambulatory Visit (INDEPENDENT_AMBULATORY_CARE_PROVIDER_SITE_OTHER): Payer: Medicaid Other

## 2018-04-14 DIAGNOSIS — Z95810 Presence of automatic (implantable) cardiac defibrillator: Secondary | ICD-10-CM

## 2018-04-14 DIAGNOSIS — I5022 Chronic systolic (congestive) heart failure: Secondary | ICD-10-CM

## 2018-04-14 IMAGING — CT CT ANGIO NECK
1 of 10 series · 1 of 33 positions shown · IV contrast (Iohexol (Omnipaque 350))
Comparison: Head CT without contrast 7711 hours today.

CLINICAL DATA: 59-year-old male with stroke like symptoms several
days ago including right side weakness and slurred speech. Initial
encounter.

EXAM:
CT ANGIOGRAPHY HEAD AND NECK
TECHNIQUE: Multidetector CT imaging of the head and neck was performed using
the standard protocol during bolus administration of intravenous
contrast. Multiplanar CT image reconstructions and MIPs were
obtained to evaluate the vascular anatomy. Carotid stenosis
measurements (when applicable) are obtained utilizing NASCET
criteria, using the distal internal carotid diameter as the
denominator.
CONTRAST:  50 mL Isovue 370

[Series 200: locator · axial · 0.49mm/px · 1 of 1 slices shown]
[im 1/1  soft-tissue]
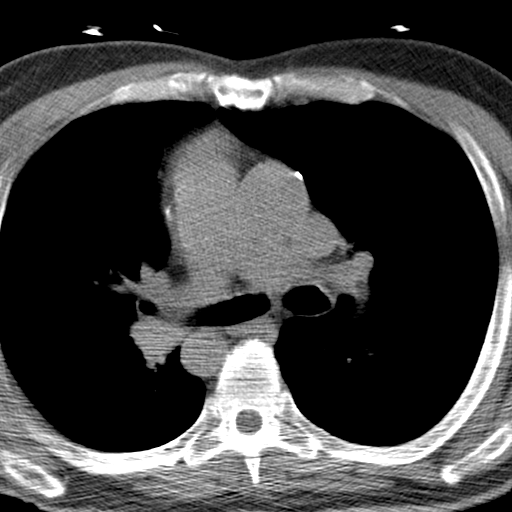

[1 of 33 positions shown; findings below may reference images not displayed]

FINDINGS: CTA NECK

Skeleton: Absent dentition. Right TMJ degeneration. Multilevel
chronic disc and endplate degeneration in the spine. Sequela of
median sternotomy. No acute osseous abnormality identified.

Mucosal thickening and bubbly opacity in the right maxillary sinus.

Other neck: In the visualized upper chest there is situs inversus
with right-sided arch, mirror image branching.

Sequelae of CABG. No superior mediastinal lymphadenopathy. Negative
lung apices.

Negative thyroid, larynx, pharynx, parapharyngeal spaces,
retropharyngeal space, sublingual space, submandibular glands and
parotid glands. No cervical lymphadenopathy.

Aortic arch: Right side arch with mirror-image branching. Up to
moderate for age soft and calcified plaque at the great vessel
origins. No great vessel origin stenosis.

Right carotid system: Soft and calcified plaque throughout the
anterior right CCA resulting an less than 50 % stenosis with respect
to the distal vessel. Soft and calcified plaque at the right carotid
bifurcation resulting in 65 % stenosis with respect to the distal
vessel (series 404 image 83. Negative cervical right ICA otherwise.

Left carotid system: No left CCA origin stenosis. Intermittent
calcified plaque predominantly in the anterior left CCA without
stenosis. At the left carotid bifurcation there is soft and
calcified plaque resulting in less than 50 % stenosis with respect
to the distal vessel. However, distal to the bulb there is
circumferential soft plaque resulting in a diminutive caliber of the
left ICA, which appears moderately to severely stenotic at the C1-C2
level (series 401 image 109). The vessel remains patent to the
skullbase.

Vertebral arteries:No proximal subclavian artery stenosis. Mild soft
and calcified plaque at both vertebral artery origins with up to
mild origin stenosis. Otherwise negative fairly codominant vertebral
arteries to the skullbase.

CTA HEAD

Posterior circulation: Calcified right V4 segment plaque with no
hemodynamically significant stenosis. As the left vertebral crosses
the dura there is soft and calcified plaque resulting in moderate to
severe stenosis which coincides with the left PICA origin which
remains patent (series 403, image 141). Additional calcified plaque
in the left V4 segment with moderate to severe stenosis on image
137. The distal left vertebral artery remains patent to the basilar.

No basilar artery stenosis. Patent AICA and SCA origins. Mild
irregularity at both PCA origins without stenosis. Posterior
communicating arteries are diminutive or absent. Left PCA branches
are within normal limits. There is moderate right P2 segment
stenosis (series 406, image 22) with otherwise negative right PCA
branches.

Anterior circulation: Severe left ICA siphon soft and calcified
plaque such that the vessel appears functionally occluded in the
siphon to the mid left supraclinoid segment. Reconstituted flow at
the left ICA terminus. Normal left MCA and ACA origins.

Severe calcified atherosclerosis of the right siphon, especially in
the cavernous and supraclinoid segments. Tandem moderate to severe
stenosis results. Right ICA terminus remains patent. Normal right
MCA and ACA origins.

Anterior communicating artery and proximal A2 segments are normal.
Distal A2 segment irregularity. There is calcified plaque at the
left callosomarginal artery origin with moderate to severe stenosis
(series 406, image 24). Left MCA M1 segment, bifurcation, and left
MCA branches are within normal limits. Right MCA M1 segment,
bifurcation, and right MCA branches are within normal limits.

Venous sinuses: Patent.

Anatomic variants: Chest situs inversus. Right side aortic arch with
mirror image branching.

Delayed phase: No cortically based acute infarct identified. No
abnormal enhancement identified.
IMPRESSION: 1. Chest Situs Inversus with Right Side Aortic Arch and mirror image
branching.
2. Long segment high-grade stenosis of the cervical left ICA and
FUNCTIONAL OCCLUSION OF THE LEFT ICA SIPHON (unclear whether there
is residual trickle flow). Reconstituted flow at the left ICA
terminus such that the left MCA and ACA origins remain normal.
3. Very advanced for age atherosclerosis in the head and neck.
Additional atherosclerotic moderate or severe stenosis:
- Right ICA origin (65%),
- Right ICA siphon,
- Left Vertebral artery V4 segment,
- Right PCA P2 segment (moderate)
- Left ACA callosomarginal artery origin.
4.  No cortically based acute infarct identified.

#2 and other salient findings discussed with Neurology Berta, Jak
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
## 2018-04-15 ENCOUNTER — Telehealth: Payer: Self-pay | Admitting: *Deleted

## 2018-04-15 ENCOUNTER — Encounter: Payer: Self-pay | Admitting: Cardiology

## 2018-04-15 ENCOUNTER — Ambulatory Visit: Payer: Medicaid Other | Admitting: Cardiology

## 2018-04-15 VITALS — BP 110/64 | HR 90 | Ht 66.0 in | Wt 161.8 lb

## 2018-04-15 DIAGNOSIS — I63232 Cerebral infarction due to unspecified occlusion or stenosis of left carotid arteries: Secondary | ICD-10-CM | POA: Diagnosis not present

## 2018-04-15 DIAGNOSIS — I5022 Chronic systolic (congestive) heart failure: Secondary | ICD-10-CM

## 2018-04-15 DIAGNOSIS — I255 Ischemic cardiomyopathy: Secondary | ICD-10-CM

## 2018-04-15 DIAGNOSIS — Q240 Dextrocardia: Secondary | ICD-10-CM

## 2018-04-15 DIAGNOSIS — Z79899 Other long term (current) drug therapy: Secondary | ICD-10-CM | POA: Diagnosis not present

## 2018-04-15 DIAGNOSIS — E1152 Type 2 diabetes mellitus with diabetic peripheral angiopathy with gangrene: Secondary | ICD-10-CM | POA: Diagnosis not present

## 2018-04-15 DIAGNOSIS — I25709 Atherosclerosis of coronary artery bypass graft(s), unspecified, with unspecified angina pectoris: Secondary | ICD-10-CM

## 2018-04-15 DIAGNOSIS — Z95810 Presence of automatic (implantable) cardiac defibrillator: Secondary | ICD-10-CM

## 2018-04-15 LAB — BASIC METABOLIC PANEL
BUN/Creatinine Ratio: 16 (ref 9–20)
BUN: 20 mg/dL (ref 6–24)
CHLORIDE: 103 mmol/L (ref 96–106)
CO2: 24 mmol/L (ref 20–29)
Calcium: 9 mg/dL (ref 8.7–10.2)
Creatinine, Ser: 1.28 mg/dL — ABNORMAL HIGH (ref 0.76–1.27)
GFR calc non Af Amer: 61 mL/min/{1.73_m2} (ref >59)
GFR, EST AFRICAN AMERICAN: 70 mL/min/{1.73_m2} (ref >59)
Glucose: 198 mg/dL — ABNORMAL HIGH (ref 65–99)
Potassium: 3.9 mmol/L (ref 3.5–5.2)
Sodium: 141 mmol/L (ref 134–144)

## 2018-04-15 IMAGING — XA IR ANGIO INTRA EXTRACRAN SEL COM CAROTID INNOMINATE BILAT MOD SE
2 series · 2 of 2 positions shown · IV contrast (IODINE)
Comparison: CT angiogram of 03/20/2016.

INDICATION: Transient expressive aphasia and right-sided weakness with
resolution. CT angiogram revealing occluded left internal carotid
artery at the cranial skull base.

EXAM:
IR ANGIO VERTEBRAL SEL VERTEBRAL BILAT MOD SED BILATERAL COMMON
CAROTID ARTERIOGRAMS AND BILATERAL VERTEBRAL ARTERY ANGIOGRAMS:
TECHNIQUE: Informed written consent was obtained from the patient after a
thorough discussion of the procedural risks, benefits and
alternatives. All questions were addressed. Maximal Sterile Barrier
Technique was utilized including caps, mask, sterile gowns, sterile
gloves, sterile drape, hand hygiene and skin antiseptic. A timeout
was performed prior to the initiation of the procedure.

[Series 4: carotid · 1 of 1 slices shown (1 of 2)]
[im 1/1]
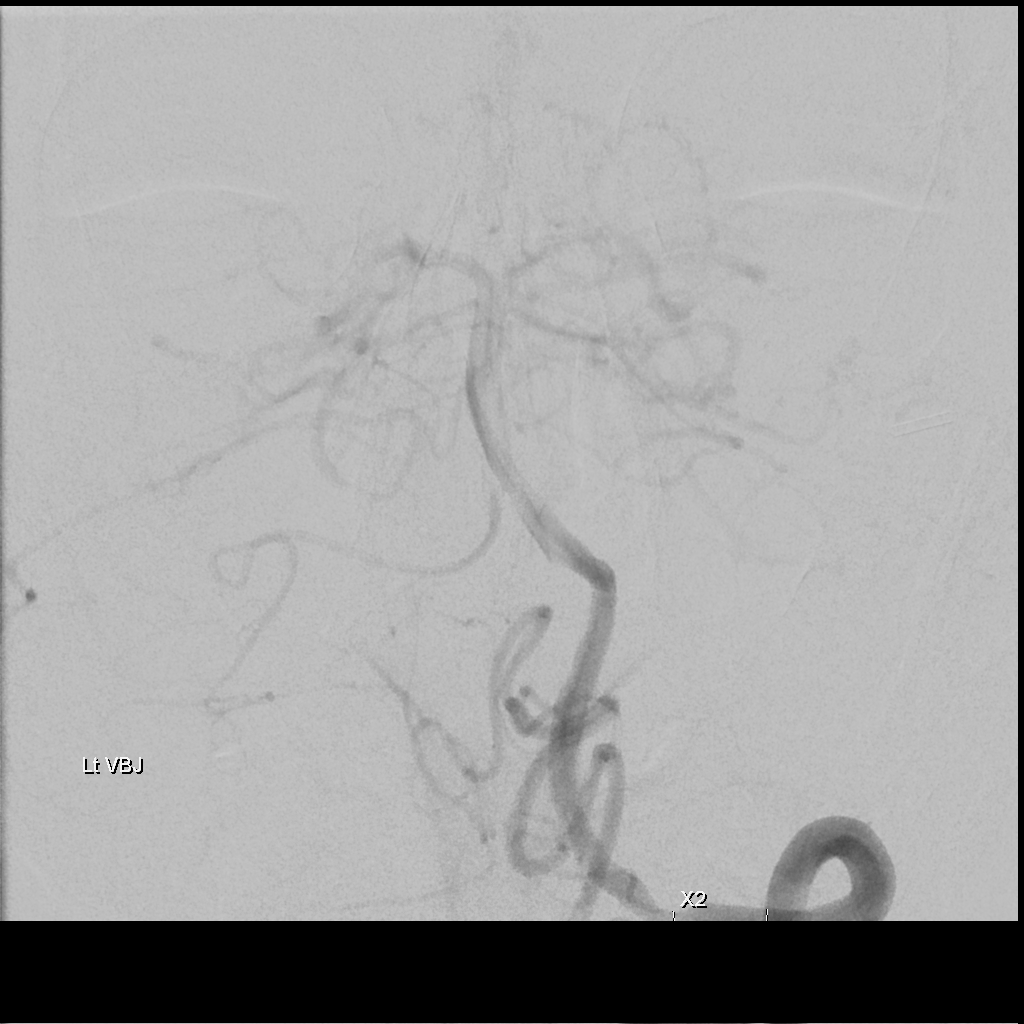

[Series 9: carotid · 1 of 1 slices shown (2 of 2)]
[im 1/1]
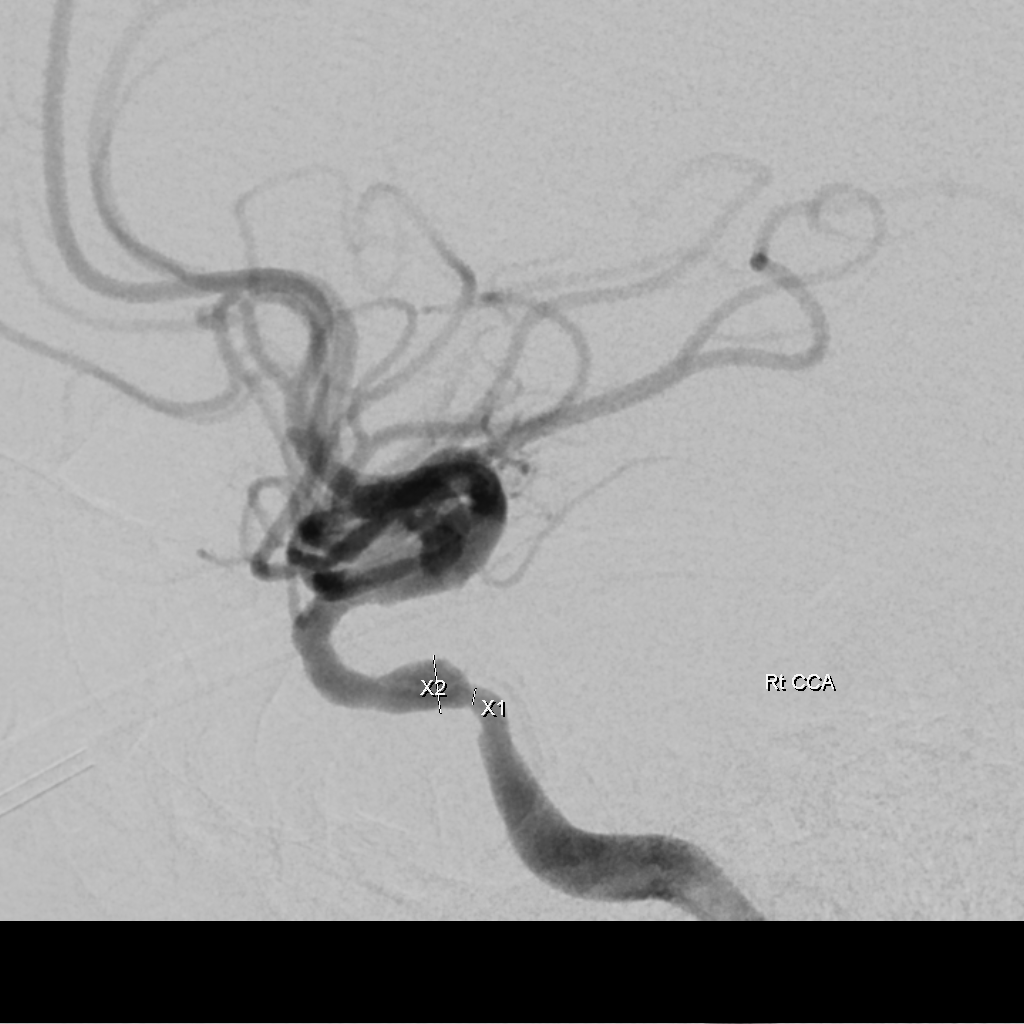

[2 of 2 positions shown; findings below may reference images not displayed]

MEDICATIONS:
None.

ANESTHESIA/SEDATION:
Versed 1 mg IV; Fentanyl 50 mcg IV

Moderate Sedation Time:  35

The patient was continuously monitored during the procedure by the
interventional radiology nurse under my direct supervision.

CONTRAST:  Omnipaque 300 approximately 65 mL.

FLUOROSCOPY TIME:  Fluoroscopy Time: 10 minutes 48 seconds (8805
mGy).

COMPLICATIONS:
None immediate.
The right groin was prepped and draped in the usual sterile fashion.
Thereafter using modified Seldinger technique, transfemoral access
into the right common femoral artery was obtained without
difficulty. Over a 0.035 inch guidewire a 5 French Pinnacle sheath
was inserted. Through this, and also over a 0.035 inch guidewire, a
JB 1 catheter was advanced to the aortic arch region and selectively
positioned in the left common carotid artery, the left vertebral
artery, the right common carotid artery and the right subclavian
artery.
FINDINGS: The aortic arch demonstrates a situs mirror image inversion anomaly,
in keeping with the patient's dextrocardia.

The left common carotid arteriogram demonstrates the left external
carotid artery to be mildly narrowed just distal to its origin. Its
branches appear to be widely opened.

The left internal carotid artery at the bulb distally demonstrates
diffusely decreased caliber with slow antegrade flow to the cranial
skull base. There is complete angiographic occlusion of this vessel
at the level of the ipsilateral ophthalmic artery.

Multiple focal areas of narrowing are seen in the more proximal
petrous and the cavernous segments of the left internal carotid
artery.

The left vertebral artery origin is normal.

The vessel is seen to opacify to the cranial skull base. Normal
opacification of the left posterior inferior cerebellar artery is
noted.

Just distal to this, there is an approximately 80% stenoses of the
left vertebrobasilar junction. Distal to this, vessel appears
adequate in caliber.

The opacified portions of the basilar artery, the posterior cerebral
artery, superior cerebellar arteries and the anterior-inferior
cerebellar arteries appear normal into the capillary and the venous
phases. Non-opacified blood is seen in the basilar artery from the
contralateral vertebral artery.

The right common carotid arteriogram demonstrates the right external
carotid artery and its major branches to be widely.

There is mild tapered narrowing of the distal right common carotid
artery just proximal to the carotid bifurcation.

The right internal carotid artery at the bulb demonstrates
approximately 40% stenosis by the NASCET criteria. This is
associated with a posteriorly positioned atherosclerotic plaque
without evidence of acute ulcerations.

Distal to this, the right internal carotid artery is seen to opacify
to the cranial skull base. There is a tapered narrowing of the
distal petrous segment extending into the proximal caval segment
were there is approximately 80% stenosis with poststenotic
dilatation.

Distal to this, the right internal carotid artery in the cavernous
and the supraclinoid segments is widely patent.

The right middle and the right anterior cerebral arteries are seen
to opacify normally into the capillary and the venous phases. Early
bifurcation of the right MCA is a developmental variation.

Prompt opacification via the anterior communicating artery of the
left anterior cerebral artery and left middle cerebral artery
distribution is noted.

However the delayed arterial phase demonstrates slow ascent of
contrast in the left parietal cortical and subcortical areas.

Also noted is a retrograde distal opacification of the supraclinoid
left ICA with no antegrade clearance of contrast.

The right vertebral artery origin is normal.

The vessel is seen to opacify normally to the cranial skull base. A
hypoplastic right posterior-inferior cerebellar artery is seen. The
distal vertebrobasilar junction, the basilar artery, the posterior
cerebral artery, superior cerebellar arteries and the
anterior-inferior cerebellar arteries opacify normally into the
capillary and the venous phases.

Transient retrograde opacification via the left posterior
communicating artery of the left middle cerebral artery distribution
is noted.
IMPRESSION: Angiographically occluded left internal carotid artery in the caval
segment at the level of the ophthalmic artery. Secondary
decompression of the proximal left internal carotid artery.

Approximately 80% stenosis of the left vertebrobasilar junction just
distal to the left posterior inferior cerebellar artery.

Approximately 80% stenosis with poststenotic dilatation of the right
internal carotid artery petrous cavernous junction.

Prompt opacification of the left anterior cerebral artery and the
left middle cerebral artery distribution via the anterior
communicating artery from the right internal carotid artery.

Moderate area of hypoperfusion involving the left posterior parietal
cortical and subcortical areas as described above.

Mirror image aortic arch configuration a developmental anomaly, in
keeping with the dextrocardia.

## 2018-04-15 MED ORDER — SACUBITRIL-VALSARTAN 97-103 MG PO TABS: 1.0000 | ORAL_TABLET | Freq: Two times a day (BID) | ORAL | 11 refills | Status: DC

## 2018-04-15 NOTE — Telephone Encounter (Signed)
Called pt and left message with his wife to have him call back re: Lasix

## 2018-04-15 NOTE — Patient Instructions (Addendum)
Medication Instructions:  Your physician recommends that you continue on your current medications as directed. Please refer to the Current Medication list given to you today.   Labwork: TODAY:  BMET  Testing/Procedures: None ordered  Follow-Up: Your physician wants you to follow-up in: 6 MONTHS WITH DR. Katrinka Blazing  You will receive a reminder letter in the mail two months in advance. If you don't receive a letter, please call our office to schedule the follow-up appointment.  Your physician wants you to follow-up in: 6 MONTHS WITH DR. Elberta Fortis   You will receive a reminder letter in the mail two months in advance. If you don't receive a letter, please call our office to schedule the follow-up appointment.    Any Other Special Instructions Will Be Listed Below (If Applicable).     If you need a refill on your cardiac medications before your next appointment, please call your pharmacy.

## 2018-04-15 NOTE — Progress Notes (Signed)
EPIC Encounter for ICM Monitoring  Patient Name: Travis Tran is a 62 y.o. male Date: 04/15/2018 Primary Care Physican: Maren Reamer, MD (Inactive) Primary Cardiologist:Smith/Bensimhon Electrophysiologist: Camnitz Dry Weight: 161 lbs office weight today       Spoke with sister.  She stated he was not home but did get the message to increase Lasix 2 days as ordered by Cecilie Kicks today   Thoracic impedance abnormal suggesting fluid accumulation since 03/19/2018 with exception of one day at baseline.  Prescribed dosage: Furosemide 40 mg1 tablet daily.   Labs: 11/07/2017 Creatinine 1.34, BUN 28, Potassium 4.7, Sodium 134, EGFR 58 Care Everywhere 07/11/2017 Creatinine 1.39, BUN 28, Potassium 5.1, Sodium 138, EGFR 55-64  Recommendations: Patient had office visit today with Cecilie Kicks, NP  Follow-up plan: ICM clinic phone appointment on 04/18/2018 to recheck fluid levels.  Office appointment scheduled 05/20/2018 with Dr Haroldine Laws.  Copy of ICM check sent to Dr. Curt Bears and Cecilie Kicks NP.   3 month ICM trend: 04/14/2018    1 Year ICM trend:       Rosalene Billings, RN 04/15/2018 9:35 AM

## 2018-04-15 NOTE — Telephone Encounter (Signed)
Pt returned call and he has been made aware to take a extra Lasix 40 mg for 2 days then go back to 1 tablet daily. Pt verbalized understanding.

## 2018-04-15 NOTE — Telephone Encounter (Signed)
-----   Message from Leone Brand, NP sent at 04/15/2018 12:42 PM EDT ----- Please refill entresto  Also have pt take extra 40 mg of lasix for next 2 days then back to one  40 mg daily.  His ICD interrogation showed increased fluid.

## 2018-04-16 NOTE — Progress Notes (Signed)
Pt has been made aware of normal result and verbalized understanding.  jw 04/16/18

## 2018-04-18 ENCOUNTER — Ambulatory Visit (INDEPENDENT_AMBULATORY_CARE_PROVIDER_SITE_OTHER): Payer: Self-pay

## 2018-04-18 DIAGNOSIS — I5022 Chronic systolic (congestive) heart failure: Secondary | ICD-10-CM

## 2018-04-18 DIAGNOSIS — Z95810 Presence of automatic (implantable) cardiac defibrillator: Secondary | ICD-10-CM

## 2018-04-18 NOTE — Progress Notes (Signed)
EPIC Encounter for ICM Monitoring  Patient Name: Travis Tran is a 62 y.o. male Date: 04/18/2018 Primary Care Physican: Langeland, Dawn T, MD (Inactive) Primary Cardiologist:Smith/Bensimhon Electrophysiologist: Camnitz Dry Weight: 155 lbs   Heart Failure questions reviewed, pt asymptomatic.  He lost 3 lbs since increasing Furosemide x 2 days   Thoracic impedance improved since taking extra Furosemide x 2 days and close to baseline normal.   Prescribed dosage: Furosemide 40 mg1 tablet daily.   Labs: 11/07/2017 Creatinine 1.34, BUN 28, Potassium 4.7, Sodium 134, EGFR 58 Care Everywhere 07/11/2017 Creatinine 1.39, BUN 28, Potassium 5.1, Sodium 138, EGFR 55-64  Recommendations:   Encouraged to call for fluid symptoms.  Follow-up plan: ICM clinic phone appointment on 05/22/2018.  Office appointment scheduled 05/20/2018 with Dr. Bensimhon.  Copy of ICM check sent to Dr. Camnitz, Dr Bensimhon and Laura Ingold NP.   3 month ICM trend: 04/18/2018    1 Year ICM trend:       Laurie S Short, RN 04/18/2018 9:03 AM   

## 2018-05-20 ENCOUNTER — Encounter (HOSPITAL_COMMUNITY): Payer: Medicaid Other | Admitting: Internal Medicine

## 2018-05-21 ENCOUNTER — Ambulatory Visit (HOSPITAL_COMMUNITY)
Admission: RE | Admit: 2018-05-21 | Discharge: 2018-05-21 | Disposition: A | Discharge status: Home / Self Care | Payer: Medicaid Other | Source: Ambulatory Visit | Attending: Internal Medicine | Admitting: Internal Medicine

## 2018-05-21 ENCOUNTER — Encounter (HOSPITAL_COMMUNITY): Payer: Medicaid Other | Admitting: Internal Medicine

## 2018-05-21 VITALS — BP 144/82 | HR 93 | Wt 158.8 lb

## 2018-05-21 DIAGNOSIS — J45909 Unspecified asthma, uncomplicated: Secondary | ICD-10-CM | POA: Diagnosis not present

## 2018-05-21 DIAGNOSIS — I255 Ischemic cardiomyopathy: Secondary | ICD-10-CM | POA: Diagnosis not present

## 2018-05-21 DIAGNOSIS — I130 Hypertensive heart and chronic kidney disease with heart failure and stage 1 through stage 4 chronic kidney disease, or unspecified chronic kidney disease: Secondary | ICD-10-CM | POA: Insufficient documentation

## 2018-05-21 DIAGNOSIS — I6523 Occlusion and stenosis of bilateral carotid arteries: Secondary | ICD-10-CM | POA: Diagnosis not present

## 2018-05-21 DIAGNOSIS — K219 Gastro-esophageal reflux disease without esophagitis: Secondary | ICD-10-CM | POA: Insufficient documentation

## 2018-05-21 DIAGNOSIS — Z79899 Other long term (current) drug therapy: Secondary | ICD-10-CM | POA: Insufficient documentation

## 2018-05-21 DIAGNOSIS — E1122 Type 2 diabetes mellitus with diabetic chronic kidney disease: Secondary | ICD-10-CM | POA: Insufficient documentation

## 2018-05-21 DIAGNOSIS — I25118 Atherosclerotic heart disease of native coronary artery with other forms of angina pectoris: Secondary | ICD-10-CM

## 2018-05-21 DIAGNOSIS — Z95810 Presence of automatic (implantable) cardiac defibrillator: Secondary | ICD-10-CM | POA: Insufficient documentation

## 2018-05-21 DIAGNOSIS — Z7951 Long term (current) use of inhaled steroids: Secondary | ICD-10-CM | POA: Diagnosis not present

## 2018-05-21 DIAGNOSIS — I5022 Chronic systolic (congestive) heart failure: Secondary | ICD-10-CM | POA: Diagnosis not present

## 2018-05-21 DIAGNOSIS — Z8673 Personal history of transient ischemic attack (TIA), and cerebral infarction without residual deficits: Secondary | ICD-10-CM | POA: Insufficient documentation

## 2018-05-21 DIAGNOSIS — M069 Rheumatoid arthritis, unspecified: Secondary | ICD-10-CM | POA: Insufficient documentation

## 2018-05-21 DIAGNOSIS — Z794 Long term (current) use of insulin: Secondary | ICD-10-CM | POA: Diagnosis not present

## 2018-05-21 DIAGNOSIS — Q240 Dextrocardia: Secondary | ICD-10-CM | POA: Diagnosis not present

## 2018-05-21 DIAGNOSIS — Z7982 Long term (current) use of aspirin: Secondary | ICD-10-CM | POA: Diagnosis not present

## 2018-05-21 DIAGNOSIS — E7800 Pure hypercholesterolemia, unspecified: Secondary | ICD-10-CM | POA: Diagnosis not present

## 2018-05-21 DIAGNOSIS — I2510 Atherosclerotic heart disease of native coronary artery without angina pectoris: Secondary | ICD-10-CM | POA: Diagnosis not present

## 2018-05-21 DIAGNOSIS — F419 Anxiety disorder, unspecified: Secondary | ICD-10-CM | POA: Insufficient documentation

## 2018-05-21 DIAGNOSIS — Z882 Allergy status to sulfonamides status: Secondary | ICD-10-CM | POA: Diagnosis not present

## 2018-05-21 DIAGNOSIS — E1152 Type 2 diabetes mellitus with diabetic peripheral angiopathy with gangrene: Secondary | ICD-10-CM

## 2018-05-21 DIAGNOSIS — Z955 Presence of coronary angioplasty implant and graft: Secondary | ICD-10-CM | POA: Insufficient documentation

## 2018-05-21 DIAGNOSIS — I480 Paroxysmal atrial fibrillation: Secondary | ICD-10-CM | POA: Diagnosis not present

## 2018-05-21 DIAGNOSIS — N182 Chronic kidney disease, stage 2 (mild): Secondary | ICD-10-CM | POA: Insufficient documentation

## 2018-05-21 DIAGNOSIS — I10 Essential (primary) hypertension: Secondary | ICD-10-CM | POA: Diagnosis not present

## 2018-05-21 DIAGNOSIS — Z7902 Long term (current) use of antithrombotics/antiplatelets: Secondary | ICD-10-CM | POA: Diagnosis not present

## 2018-05-21 DIAGNOSIS — Z951 Presence of aortocoronary bypass graft: Secondary | ICD-10-CM | POA: Insufficient documentation

## 2018-05-21 MED ORDER — CARVEDILOL 6.25 MG PO TABS: 6.2500 mg | ORAL_TABLET | Freq: Two times a day (BID) | ORAL | 3 refills | Status: DC

## 2018-05-21 MED ORDER — EMPAGLIFLOZIN 10 MG PO TABS: 10.0000 mg | ORAL_TABLET | Freq: Every day | ORAL | 6 refills | Status: DC
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
## 2018-05-21 NOTE — Patient Instructions (Signed)
Increase Carvedilol to 6.25 to Twice daily   Start Jardiance 10 mg daily  Your physician has requested that you have an echocardiogram. Echocardiography is a painless test that uses sound waves to create images of your heart. It provides your doctor with information about the size and shape of your heart and how well your heart's chambers and valves are working. This procedure takes approximately one hour. There are no restrictions for this procedure.  We will contact you in 6 months to schedule your next appointment.

## 2018-05-22 ENCOUNTER — Ambulatory Visit (INDEPENDENT_AMBULATORY_CARE_PROVIDER_SITE_OTHER): Payer: Medicaid Other

## 2018-05-22 ENCOUNTER — Telehealth: Payer: Self-pay

## 2018-05-22 DIAGNOSIS — Z95810 Presence of automatic (implantable) cardiac defibrillator: Secondary | ICD-10-CM | POA: Diagnosis not present

## 2018-05-22 DIAGNOSIS — I5022 Chronic systolic (congestive) heart failure: Secondary | ICD-10-CM | POA: Diagnosis not present

## 2018-05-22 NOTE — Telephone Encounter (Signed)
Remote ICM transmission received.  Attempted call to patient and left person answering phone stated he was not home.

## 2018-05-22 NOTE — Progress Notes (Signed)
EPIC Encounter for ICM Monitoring  Patient Name: Travis Tran is a 62 y.o. male Date: 05/22/2018 Primary Care Physican: Maren Reamer, MD (Inactive) Primary Cardiologist:Smith/Bensimhon Electrophysiologist: Camnitz Dry Weight: 154lbs      Heart Failure questions reviewed, pt asymptomatic.   Thoracic impedance normal.  Prescribed dosage: Furosemide 40 mg1 tablet daily.   Labs: 04/15/2018 Creatinine 1.28, BUN 20, Potassium 3.9, Sodium 141, EGFR 61-70 11/07/2017 Creatinine 1.34, BUN 28, Potassium 4.7, Sodium 134, EGFR 58 Care Everywhere 07/11/2017 Creatinine 1.39, BUN 28, Potassium 5.1, Sodium 138, EGFR 55-64  Recommendations: No changes.  Encouraged to call for fluid symptoms.  Follow-up plan: ICM clinic phone appointment on 06/23/2018.  Office appointment scheduled 07/14/2018 with Dr. Curt Bears.   Copy of ICM check sent to Dr. Curt Bears..   3 month ICM trend: 05/22/2018    1 Year ICM trend:       Rosalene Billings, RN 05/22/2018 11:04 AM

## 2018-05-23 ENCOUNTER — Telehealth (HOSPITAL_COMMUNITY): Payer: Self-pay | Admitting: *Deleted

## 2018-05-23 NOTE — Telephone Encounter (Signed)
Patient requested records to be faxed to his attorney at [phone], Attn:  Girtha Rm.  Records faxed today.

## 2018-05-26 ENCOUNTER — Telehealth (HOSPITAL_COMMUNITY): Payer: Self-pay | Admitting: *Deleted

## 2018-05-26 NOTE — Telephone Encounter (Signed)
-----   Message from Georgina Peer, RN sent at 05/23/2018  9:45 AM EDT ----- Regarding: [name] PA Patient called stating [name] was needing a PA. Thanks

## 2018-05-26 NOTE — Telephone Encounter (Signed)
Completed PA for pt's [name], med approved through 05/21/19, [place] # [id number]

## 2018-05-27 ENCOUNTER — Other Ambulatory Visit: Payer: Self-pay | Admitting: Interventional Cardiology

## 2018-05-27 DIAGNOSIS — Z95810 Presence of automatic (implantable) cardiac defibrillator: Secondary | ICD-10-CM

## 2018-05-27 DIAGNOSIS — I25810 Atherosclerosis of coronary artery bypass graft(s) without angina pectoris: Secondary | ICD-10-CM

## 2018-05-27 DIAGNOSIS — I10 Essential (primary) hypertension: Secondary | ICD-10-CM

## 2018-05-27 DIAGNOSIS — I5022 Chronic systolic (congestive) heart failure: Secondary | ICD-10-CM

## 2018-05-27 DIAGNOSIS — Q240 Dextrocardia: Secondary | ICD-10-CM

## 2018-05-27 DIAGNOSIS — I63232 Cerebral infarction due to unspecified occlusion or stenosis of left carotid arteries: Secondary | ICD-10-CM

## 2018-06-11 ENCOUNTER — Ambulatory Visit (HOSPITAL_COMMUNITY)
Admission: RE | Admit: 2018-06-11 | Discharge: 2018-06-11 | Disposition: A | Discharge status: Home / Self Care | Payer: Medicaid Other | Source: Ambulatory Visit | Attending: Internal Medicine | Admitting: Internal Medicine

## 2018-06-11 DIAGNOSIS — I4891 Unspecified atrial fibrillation: Secondary | ICD-10-CM | POA: Diagnosis not present

## 2018-06-11 DIAGNOSIS — I255 Ischemic cardiomyopathy: Secondary | ICD-10-CM | POA: Insufficient documentation

## 2018-06-11 DIAGNOSIS — I2510 Atherosclerotic heart disease of native coronary artery without angina pectoris: Secondary | ICD-10-CM | POA: Diagnosis not present

## 2018-06-11 DIAGNOSIS — I509 Heart failure, unspecified: Secondary | ICD-10-CM | POA: Diagnosis not present

## 2018-06-11 DIAGNOSIS — I5022 Chronic systolic (congestive) heart failure: Secondary | ICD-10-CM

## 2018-06-11 DIAGNOSIS — Q240 Dextrocardia: Secondary | ICD-10-CM | POA: Diagnosis not present

## 2018-06-11 DIAGNOSIS — Z951 Presence of aortocoronary bypass graft: Secondary | ICD-10-CM | POA: Insufficient documentation

## 2018-06-11 DIAGNOSIS — E119 Type 2 diabetes mellitus without complications: Secondary | ICD-10-CM | POA: Diagnosis not present

## 2018-06-11 NOTE — Progress Notes (Signed)
  Echocardiogram 2D Echocardiogram has been performed.  Celene Skeen 06/11/2018, 3:09 PM

## 2018-06-12 ENCOUNTER — Other Ambulatory Visit: Payer: Self-pay | Admitting: Internal Medicine

## 2018-06-17 DIAGNOSIS — R42 Dizziness and giddiness: Secondary | ICD-10-CM | POA: Diagnosis not present

## 2018-06-17 DIAGNOSIS — N183 Chronic kidney disease, stage 3 (moderate): Secondary | ICD-10-CM | POA: Diagnosis not present

## 2018-06-17 DIAGNOSIS — I255 Ischemic cardiomyopathy: Secondary | ICD-10-CM | POA: Diagnosis not present

## 2018-06-17 DIAGNOSIS — E1022 Type 1 diabetes mellitus with diabetic chronic kidney disease: Secondary | ICD-10-CM | POA: Diagnosis not present

## 2018-06-23 ENCOUNTER — Ambulatory Visit (INDEPENDENT_AMBULATORY_CARE_PROVIDER_SITE_OTHER): Payer: Medicaid Other

## 2018-06-23 DIAGNOSIS — I5022 Chronic systolic (congestive) heart failure: Secondary | ICD-10-CM | POA: Diagnosis not present

## 2018-06-23 DIAGNOSIS — Z95810 Presence of automatic (implantable) cardiac defibrillator: Secondary | ICD-10-CM | POA: Diagnosis not present

## 2018-06-23 NOTE — Progress Notes (Signed)
EPIC Encounter for ICM Monitoring  Patient Name: Travis Tran is a 62 y.o. male Date: 06/23/2018 Primary Care Physican: Maren Reamer, MD (Inactive) Primary Cardiologist:Smith/Bensimhon Electrophysiologist: Camnitz Dry Weight: 152lbs        Heart Failure questions reviewed, pt asymptomatic and is feeling fine.   Thoracic impedance close to baseline normal.  Prescribed dosage: Furosemide 40 mg1 tablet daily.   Labs: 04/15/2018 Creatinine 1.28, BUN 20, Potassium 3.9, Sodium 141, EGFR 61-70 11/07/2017 Creatinine 1.34, BUN 28, Potassium 4.7, Sodium 134, EGFR 58 Care Everywhere 07/11/2017 Creatinine 1.39, BUN 28, Potassium 5.1, Sodium 138, EGFR 55-64  Recommendations: No changes.  Reinforced fluid restriction and sodium restriction.  Encouraged to call for fluid symptoms.  Follow-up plan: ICM clinic phone appointment on 08/14/2018.   Office appointment scheduled 07/14/2018 with Dr. Curt Bears.    Copy of ICM check sent to Dr. Curt Bears.   3 month ICM trend: 06/23/2018    1 Year ICM trend:       Rosalene Billings, RN 06/23/2018 3:26 PM

## 2018-06-30 ENCOUNTER — Telehealth (HOSPITAL_COMMUNITY): Payer: Self-pay | Admitting: Cardiology

## 2018-06-30 NOTE — Telephone Encounter (Signed)
PATIENT REPORTS SINCE STARTING JARDIANCE B/P READING HAVE DROPPED DRASTICALLY.   56/50 AND 66/50 ARE THE LOWEST READINGS  REPORTS WEIGHT ARE STABLE AT 148-151  PLEASE ADVISE

## 2018-06-30 NOTE — Telephone Encounter (Signed)
Stop jardiance.  Hold entresto, carvedilol, and spiro for today and tomorrow then restart on WEdnesday.   Please call .   [name]

## 2018-06-30 NOTE — Telephone Encounter (Signed)
Pt aware.

## 2018-07-14 ENCOUNTER — Encounter: Payer: Self-pay | Admitting: Cardiology

## 2018-07-14 ENCOUNTER — Ambulatory Visit: Payer: Medicaid Other | Admitting: Cardiology

## 2018-07-14 VITALS — BP 142/82 | HR 87 | Ht 66.0 in | Wt 155.8 lb

## 2018-07-14 DIAGNOSIS — I480 Paroxysmal atrial fibrillation: Secondary | ICD-10-CM | POA: Diagnosis not present

## 2018-07-14 DIAGNOSIS — N529 Male erectile dysfunction, unspecified: Secondary | ICD-10-CM

## 2018-07-14 DIAGNOSIS — I10 Essential (primary) hypertension: Secondary | ICD-10-CM | POA: Diagnosis not present

## 2018-07-14 DIAGNOSIS — I255 Ischemic cardiomyopathy: Secondary | ICD-10-CM | POA: Diagnosis not present

## 2018-07-14 DIAGNOSIS — I2510 Atherosclerotic heart disease of native coronary artery without angina pectoris: Secondary | ICD-10-CM

## 2018-07-14 LAB — CUP PACEART INCLINIC DEVICE CHECK
Battery Remaining Longevity: 120 mo
Battery Voltage: 3 V
Brady Statistic RV Percent Paced: 0 %
Date Time Interrogation Session: 20190812111012
HighPow Impedance: 58 Ohm
Implantable Lead Location: 753860
Implantable Pulse Generator Implant Date: 20170808
Lead Channel Impedance Value: 247 Ohm
Lead Channel Impedance Value: 342 Ohm
Lead Channel Pacing Threshold Pulse Width: 0.4 ms
Lead Channel Setting Pacing Amplitude: 2.5 V
Lead Channel Setting Pacing Pulse Width: 0.4 ms
MDC IDC LEAD IMPLANT DT: 20170808
MDC IDC MSMT LEADCHNL RV PACING THRESHOLD AMPLITUDE: 0.5 V
MDC IDC MSMT LEADCHNL RV SENSING INTR AMPL: 17 mV
MDC IDC MSMT LEADCHNL RV SENSING INTR AMPL: 17.125 mV
MDC IDC SET LEADCHNL RV SENSING SENSITIVITY: 0.3 mV
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
## 2018-07-14 NOTE — Patient Instructions (Signed)
Medication Instructions:  Your physician recommends that you continue on your current medications as directed. Please refer to the Current Medication list given to you today.  *If you need a refill on your cardiac medications before your next appointment, please call your pharmacy*  Labwork: None ordered  Testing/Procedures: None ordered  Follow-Up: Remote monitoring is used to monitor your Pacemaker or ICD from home. This monitoring reduces the number of office visits required to check your device to one time per year. It allows [place] to keep an eye on the functioning of your device to ensure it is working properly. You are scheduled for a device check & ICM from home on 08/14/2018. You may send your transmission at any time that day. If you have a wireless device, the transmission will be sent automatically. After your physician reviews your transmission, you will receive a postcard with your next transmission date.  Your physician wants you to follow-up in: 1 year with Dr. Elberta Fortis.  You will receive a reminder letter in the mail two months in advance. If you don't receive a letter, please call our office to schedule the follow-up appointment.  Thank you for choosing [facility]!!   Dory Horn, RN [phone]

## 2018-08-11 ENCOUNTER — Other Ambulatory Visit: Payer: Self-pay | Admitting: Interventional Cardiology

## 2018-08-11 ENCOUNTER — Other Ambulatory Visit: Payer: Self-pay

## 2018-08-11 MED ORDER — ATORVASTATIN CALCIUM 80 MG PO TABS: 80.0000 mg | ORAL_TABLET | Freq: Every day | ORAL | 2 refills | Status: DC

## 2018-08-14 ENCOUNTER — Ambulatory Visit (INDEPENDENT_AMBULATORY_CARE_PROVIDER_SITE_OTHER): Payer: Medicaid Other

## 2018-08-14 ENCOUNTER — Telehealth: Payer: Self-pay

## 2018-08-14 DIAGNOSIS — Z95810 Presence of automatic (implantable) cardiac defibrillator: Secondary | ICD-10-CM | POA: Diagnosis not present

## 2018-08-14 DIAGNOSIS — I5022 Chronic systolic (congestive) heart failure: Secondary | ICD-10-CM | POA: Diagnosis not present

## 2018-08-14 NOTE — Telephone Encounter (Signed)
LMOVM reminding pt to send remote transmission.  Monthly check with Laurie  

## 2018-08-15 NOTE — Progress Notes (Signed)
EPIC Encounter for ICM Monitoring  Patient Name: Travis Tran is a 62 y.o. male Date: 08/15/2018 Primary Care Physican: Langeland, Dawn T, MD (Inactive) Primary Cardiologist:Smith/Bensimhon Electrophysiologist: Camnitz Dry Weight: Previous weight 152lbs      Spoke with sister, DPR on file.  Heart Failure questions reviewed, pt asymptomatic.   Thoracic impedance close to baseline normal but was abnormal suggesting fluid accumulation from 06/20/2018 - 08/14/2018 with exception of 3-4 days close to baseline.  Fluid index > threshold since July which is first time in the last year that has crossed normal threshold  Prescribed dosage: Furosemide 40 mg1 tablet daily.   Labs: 04/15/2018 Creatinine 1.28, BUN 20, Potassium 3.9, Sodium 141, EGFR 61-70 11/07/2017 Creatinine 1.34, BUN 28, Potassium 4.7, Sodium 134, EGFR 58 Care Everywhere 07/11/2017 Creatinine 1.39, BUN 28, Potassium 5.1, Sodium 138, EGFR 55-64  Recommendations:  Advised to limit salt intake.  Encouraged to call for fluid symptoms.  Follow-up plan: ICM clinic phone appointment on 09/15/2018.    Copy of ICM check sent to Dr. Camnitz and Dr Bensimhon.   3 month ICM trend: 08/15/2018    1 Year ICM trend:       Laurie S Short, RN 08/15/2018 8:23 AM   

## 2018-08-25 ENCOUNTER — Other Ambulatory Visit: Payer: Self-pay | Admitting: Interventional Cardiology

## 2018-08-28 DIAGNOSIS — Z79899 Other long term (current) drug therapy: Secondary | ICD-10-CM | POA: Diagnosis not present

## 2018-09-12 DIAGNOSIS — E119 Type 2 diabetes mellitus without complications: Secondary | ICD-10-CM | POA: Diagnosis not present

## 2018-09-12 DIAGNOSIS — E1022 Type 1 diabetes mellitus with diabetic chronic kidney disease: Secondary | ICD-10-CM | POA: Diagnosis not present

## 2018-09-15 ENCOUNTER — Ambulatory Visit (INDEPENDENT_AMBULATORY_CARE_PROVIDER_SITE_OTHER): Payer: Medicaid Other

## 2018-09-15 DIAGNOSIS — I5022 Chronic systolic (congestive) heart failure: Secondary | ICD-10-CM

## 2018-09-15 DIAGNOSIS — Z95810 Presence of automatic (implantable) cardiac defibrillator: Secondary | ICD-10-CM | POA: Diagnosis not present

## 2018-09-15 NOTE — Progress Notes (Signed)
EPIC Encounter for ICM Monitoring  Patient Name: Travis Tran is a 62 y.o. male Date: 09/15/2018 Primary Care Physican: Maren Reamer, MD (Inactive) Primary Cardiologist:Smith/Bensimhon Electrophysiologist: Camnitz Dry Weight: 153lbs(151-154 lbs)      Heart Failure questions reviewed, pt asymptomatic.   Thoracic impedance close to normal baseline.  Impedance has suggested he has pattern of days with fluid alternating with a couple of days at baseline in last month.   Prescribed: Furosemide 40 mg1 tablet daily.   Labs: 04/15/2018 Creatinine 1.28, BUN 20, Potassium 3.9, Sodium 141, EGFR 61-70 11/07/2017 Creatinine 1.34, BUN 28, Potassium 4.7, Sodium 134, EGFR 58 Care Everywhere 07/11/2017 Creatinine 1.39, BUN 28, Potassium 5.1, Sodium 138, EGFR 55-64  Recommendations: No changes.   Encouraged to call for fluid symptoms.  Follow-up plan: ICM clinic phone appointment on 10/16/2018.   Office appointment scheduled 10/14/2018 with Dr. Tamala Julian.    Copy of ICM check sent to Dr. Curt Bears.   3 month ICM trend: 09/15/2018    1 Year ICM trend:       Rosalene Billings, RN 09/15/2018 5:15 PM

## 2018-09-16 DIAGNOSIS — I255 Ischemic cardiomyopathy: Secondary | ICD-10-CM | POA: Diagnosis not present

## 2018-09-16 DIAGNOSIS — I25119 Atherosclerotic heart disease of native coronary artery with unspecified angina pectoris: Secondary | ICD-10-CM | POA: Diagnosis not present

## 2018-09-16 DIAGNOSIS — Z23 Encounter for immunization: Secondary | ICD-10-CM | POA: Diagnosis not present

## 2018-09-16 DIAGNOSIS — E1022 Type 1 diabetes mellitus with diabetic chronic kidney disease: Secondary | ICD-10-CM | POA: Diagnosis not present

## 2018-09-16 DIAGNOSIS — N183 Chronic kidney disease, stage 3 (moderate): Secondary | ICD-10-CM | POA: Diagnosis not present

## 2018-10-09 DIAGNOSIS — E785 Hyperlipidemia, unspecified: Secondary | ICD-10-CM | POA: Diagnosis not present

## 2018-10-09 DIAGNOSIS — Z79899 Other long term (current) drug therapy: Secondary | ICD-10-CM | POA: Diagnosis not present

## 2018-10-09 DIAGNOSIS — E1121 Type 2 diabetes mellitus with diabetic nephropathy: Secondary | ICD-10-CM | POA: Diagnosis not present
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
## 2018-10-14 ENCOUNTER — Encounter: Payer: Self-pay | Admitting: Interventional Cardiology

## 2018-10-14 ENCOUNTER — Ambulatory Visit: Payer: Medicaid Other | Admitting: Interventional Cardiology

## 2018-10-14 VITALS — BP 132/72 | HR 107 | Ht 66.0 in | Wt 159.8 lb

## 2018-10-14 DIAGNOSIS — I5022 Chronic systolic (congestive) heart failure: Secondary | ICD-10-CM

## 2018-10-14 DIAGNOSIS — N529 Male erectile dysfunction, unspecified: Secondary | ICD-10-CM | POA: Diagnosis not present

## 2018-10-14 DIAGNOSIS — Q240 Dextrocardia: Secondary | ICD-10-CM | POA: Diagnosis not present

## 2018-10-14 DIAGNOSIS — E1152 Type 2 diabetes mellitus with diabetic peripheral angiopathy with gangrene: Secondary | ICD-10-CM | POA: Diagnosis not present

## 2018-10-14 DIAGNOSIS — Z95810 Presence of automatic (implantable) cardiac defibrillator: Secondary | ICD-10-CM

## 2018-10-14 DIAGNOSIS — I480 Paroxysmal atrial fibrillation: Secondary | ICD-10-CM | POA: Diagnosis not present

## 2018-10-14 DIAGNOSIS — I25118 Atherosclerotic heart disease of native coronary artery with other forms of angina pectoris: Secondary | ICD-10-CM

## 2018-10-14 DIAGNOSIS — I10 Essential (primary) hypertension: Secondary | ICD-10-CM | POA: Diagnosis not present

## 2018-10-14 NOTE — Patient Instructions (Signed)
Medication Instructions:  Your physician recommends that you continue on your current medications as directed. Please refer to the Current Medication list given to you today.  If you need a refill on your cardiac medications before your next appointment, please call your pharmacy.   Lab work: Lipid, liver, BMET and A1C today If you have labs (blood work) drawn today and your tests are completely normal, you will receive your results only by: [name] MyChart Message (if you have MyChart) OR . A paper copy in the mail If you have any lab test that is abnormal or we need to change your treatment, we will call you to review the results.  Testing/Procedures: None  Follow-Up: At [facility], you and your health needs are our priority.  As part of our continuing mission to provide you with exceptional heart care, we have created designated Provider Care Teams.  These Care Teams include your primary Cardiologist (physician) and Advanced Practice Providers (APPs -  Physician Assistants and Nurse Practitioners) who all work together to provide you with the care you need, when you need it. You will need a follow up appointment in 12 months.  Please call our office 2 months in advance to schedule this appointment.  You may see Lesleigh Noe, MD or one of the following Advanced Practice Providers on your designated Care Team:   Norma Fredrickson, NP Nada Boozer, NP . Georgie Chard, NP  Any Other Special Instructions Will Be Listed Below (If Applicable).  Please sure to establish with a Cardiologist closer to home.

## 2018-10-15 LAB — HEPATIC FUNCTION PANEL
ALT: 8 IU/L (ref 0–44)
AST: 14 IU/L (ref 0–40)
Albumin: 3.5 g/dL — ABNORMAL LOW (ref 3.6–4.8)
Alkaline Phosphatase: 89 IU/L (ref 39–117)
BILIRUBIN TOTAL: 0.2 mg/dL (ref 0.0–1.2)
Bilirubin, Direct: 0.08 mg/dL (ref 0.00–0.40)
Total Protein: 6.1 g/dL (ref 6.0–8.5)

## 2018-10-15 LAB — LIPID PANEL
CHOLESTEROL TOTAL: 159 mg/dL (ref 100–199)
Chol/HDL Ratio: 3 ratio (ref 0.0–5.0)
HDL: 53 mg/dL (ref >39)
LDL Calculated: 62 mg/dL (ref 0–99)
Triglycerides: 218 mg/dL — ABNORMAL HIGH (ref 0–149)
VLDL CHOLESTEROL CAL: 44 mg/dL — AB (ref 5–40)

## 2018-10-15 LAB — BASIC METABOLIC PANEL
BUN / CREAT RATIO: 17 (ref 10–24)
BUN: 22 mg/dL (ref 8–27)
CALCIUM: 9.2 mg/dL (ref 8.6–10.2)
CHLORIDE: 101 mmol/L (ref 96–106)
CO2: 25 mmol/L (ref 20–29)
Creatinine, Ser: 1.27 mg/dL (ref 0.76–1.27)
GFR calc non Af Amer: 61 mL/min/{1.73_m2} (ref >59)
GFR, EST AFRICAN AMERICAN: 71 mL/min/{1.73_m2} (ref >59)
Glucose: 337 mg/dL — ABNORMAL HIGH (ref 65–99)
POTASSIUM: 4.7 mmol/L (ref 3.5–5.2)
Sodium: 141 mmol/L (ref 134–144)

## 2018-10-15 LAB — HEMOGLOBIN A1C
Est. average glucose Bld gHb Est-mCnc: 200 mg/dL
HEMOGLOBIN A1C: 8.6 % — AB (ref 4.8–5.6)

## 2018-10-16 ENCOUNTER — Ambulatory Visit (INDEPENDENT_AMBULATORY_CARE_PROVIDER_SITE_OTHER): Payer: Medicaid Other | Admitting: *Deleted

## 2018-10-16 ENCOUNTER — Ambulatory Visit (INDEPENDENT_AMBULATORY_CARE_PROVIDER_SITE_OTHER): Payer: Medicaid Other

## 2018-10-16 ENCOUNTER — Telehealth: Payer: Self-pay | Admitting: Interventional Cardiology

## 2018-10-16 DIAGNOSIS — I255 Ischemic cardiomyopathy: Secondary | ICD-10-CM

## 2018-10-16 DIAGNOSIS — E782 Mixed hyperlipidemia: Secondary | ICD-10-CM | POA: Diagnosis not present

## 2018-10-16 DIAGNOSIS — E1022 Type 1 diabetes mellitus with diabetic chronic kidney disease: Secondary | ICD-10-CM | POA: Diagnosis not present

## 2018-10-16 DIAGNOSIS — I5022 Chronic systolic (congestive) heart failure: Secondary | ICD-10-CM | POA: Diagnosis not present

## 2018-10-16 DIAGNOSIS — Z95810 Presence of automatic (implantable) cardiac defibrillator: Secondary | ICD-10-CM

## 2018-10-16 DIAGNOSIS — I25119 Atherosclerotic heart disease of native coronary artery with unspecified angina pectoris: Secondary | ICD-10-CM | POA: Diagnosis not present

## 2018-10-16 NOTE — Progress Notes (Signed)
Remote ICD transmission.   

## 2018-10-16 NOTE — Telephone Encounter (Signed)
Pt aware of lab results and copy sent to Dr Lenward Chancellor pt's PMD .Travis Tran

## 2018-10-16 NOTE — Telephone Encounter (Signed)
Follow Up:     Returning a call from yesterday, concerning his lab results. 

## 2018-10-17 NOTE — Progress Notes (Signed)
EPIC Encounter for ICM Monitoring  Patient Name: Travis Tran is a 62 y.o. male Date: 10/17/2018 Primary Care Physican: Maren Reamer, MD (Inactive) Primary Cardiologist:Smith/Bensimhon Electrophysiologist: Curt Bears Last Weight: 153lbs(151-154 lbs)         Today's Weight: 154 lbs                                          Heart Failure questions reviewed, pt asymptomatic.   Thoracic impedance normal.   Prescribed: Furosemide 40 mg1 tablet daily.   Labs: 10/14/2018 Creatinine 1.27, BUN 22, Potassium 4.7, Sodium 141, eGFR 61-71 04/15/2018 Creatinine 1.28, BUN 20, Potassium 3.9, Sodium 141, EGFR 61-70 11/07/2017 Creatinine 1.34, BUN 28, Potassium 4.7, Sodium 134, EGFR 58 Care Everywhere 07/11/2017 Creatinine 1.39, BUN 28, Potassium 5.1, Sodium 138, EGFR 55-64  Recommendations: No changes.   Encouraged to call for fluid symptoms.  Follow-up plan: ICM clinic phone appointment on 11/17/2018.    Copy of ICM check sent to Dr. Curt Bears.    3 month ICM trend: 10/16/2018    1 Year ICM trend:       Rosalene Billings, RN 10/17/2018 4:05 PM

## 2018-11-04 ENCOUNTER — Other Ambulatory Visit: Payer: Self-pay | Admitting: Neurology

## 2018-11-04 DIAGNOSIS — I63232 Cerebral infarction due to unspecified occlusion or stenosis of left carotid arteries: Secondary | ICD-10-CM

## 2018-11-04 DIAGNOSIS — I25709 Atherosclerosis of coronary artery bypass graft(s), unspecified, with unspecified angina pectoris: Secondary | ICD-10-CM

## 2018-11-05 DIAGNOSIS — E113393 Type 2 diabetes mellitus with moderate nonproliferative diabetic retinopathy without macular edema, bilateral: Secondary | ICD-10-CM | POA: Diagnosis not present

## 2018-11-05 DIAGNOSIS — H2589 Other age-related cataract: Secondary | ICD-10-CM | POA: Diagnosis not present

## 2018-11-11 DIAGNOSIS — Z79899 Other long term (current) drug therapy: Secondary | ICD-10-CM | POA: Diagnosis not present

## 2018-11-11 DIAGNOSIS — L4050 Arthropathic psoriasis, unspecified: Secondary | ICD-10-CM | POA: Diagnosis not present

## 2018-11-11 DIAGNOSIS — L409 Psoriasis, unspecified: Secondary | ICD-10-CM | POA: Diagnosis not present

## 2018-11-17 ENCOUNTER — Telehealth: Payer: Self-pay

## 2018-11-17 ENCOUNTER — Ambulatory Visit (INDEPENDENT_AMBULATORY_CARE_PROVIDER_SITE_OTHER): Payer: Medicaid Other

## 2018-11-17 DIAGNOSIS — Z95810 Presence of automatic (implantable) cardiac defibrillator: Secondary | ICD-10-CM

## 2018-11-17 DIAGNOSIS — I5022 Chronic systolic (congestive) heart failure: Secondary | ICD-10-CM

## 2018-11-17 NOTE — Telephone Encounter (Signed)
LMOVM reminding pt to send remote transmission.   

## 2018-11-18 NOTE — Progress Notes (Signed)
EPIC Encounter for ICM Monitoring  Patient Name: Travis Tran is a 62 y.o. male Date: 11/18/2018 Primary Care Physican: Maren Reamer, MD (Inactive) Primary Cardiologist:Smith/Bensimhon Electrophysiologist: Curt Bears Last Weight: 153lbs(151-154 lbs) Today's Weight: unknown  Spoke with sister, DPR.  Heart Failure questions reviewed, pt asymptomatic.  Thoracic impedancenormal.  Prescribed:Furosemide 40 mg1 tablet daily.   Labs: 10/14/2018 Creatinine 1.27, BUN 22, Potassium 4.7, Sodium 141, eGFR 61-71 04/15/2018 Creatinine 1.28, BUN 20, Potassium 3.9, Sodium 141, EGFR 61-70 11/07/2017 Creatinine 1.34, BUN 28, Potassium 4.7, Sodium 134, EGFR 58 Care Everywhere 07/11/2017 Creatinine 1.39, BUN 28, Potassium 5.1, Sodium 138, EGFR 55-64  Recommendations:No changes.   Follow-up plan: ICM clinic phone appointment on1/20/2020.   Copy of ICM check sent to [facility].   3 month ICM trend: 11/17/2018    1 Year ICM trend:       Rosalene Billings, RN 11/18/2018 2:39 PM

## 2018-12-01 DIAGNOSIS — E113393 Type 2 diabetes mellitus with moderate nonproliferative diabetic retinopathy without macular edema, bilateral: Secondary | ICD-10-CM | POA: Diagnosis not present

## 2018-12-01 DIAGNOSIS — H3561 Retinal hemorrhage, right eye: Secondary | ICD-10-CM | POA: Diagnosis not present

## 2018-12-01 DIAGNOSIS — H3562 Retinal hemorrhage, left eye: Secondary | ICD-10-CM | POA: Diagnosis not present

## 2018-12-10 DIAGNOSIS — N289 Disorder of kidney and ureter, unspecified: Secondary | ICD-10-CM | POA: Diagnosis not present

## 2018-12-15 LAB — CUP PACEART REMOTE DEVICE CHECK
Battery Remaining Longevity: 117 mo
Battery Voltage: 3 V
HIGH POWER IMPEDANCE MEASURED VALUE: 51 Ohm
Implantable Lead Implant Date: 20170808
Implantable Pulse Generator Implant Date: 20170808
Lead Channel Impedance Value: 247 Ohm
Lead Channel Impedance Value: 342 Ohm
Lead Channel Pacing Threshold Amplitude: 0.75 V
Lead Channel Sensing Intrinsic Amplitude: 15.5 mV
Lead Channel Setting Pacing Amplitude: 2.5 V
Lead Channel Setting Pacing Pulse Width: 0.4 ms
MDC IDC LEAD LOCATION: 753860
MDC IDC MSMT LEADCHNL RV PACING THRESHOLD PULSEWIDTH: 0.4 ms
MDC IDC MSMT LEADCHNL RV SENSING INTR AMPL: 15.5 mV
MDC IDC SESS DTM: 20191114083625
MDC IDC SET LEADCHNL RV SENSING SENSITIVITY: 0.3 mV
MDC IDC STAT BRADY RV PERCENT PACED: 0.01 %

## 2018-12-16 DIAGNOSIS — M05731 Rheumatoid arthritis with rheumatoid factor of right wrist without organ or systems involvement: Secondary | ICD-10-CM | POA: Diagnosis not present

## 2018-12-16 DIAGNOSIS — E1022 Type 1 diabetes mellitus with diabetic chronic kidney disease: Secondary | ICD-10-CM | POA: Diagnosis not present

## 2018-12-16 DIAGNOSIS — E782 Mixed hyperlipidemia: Secondary | ICD-10-CM | POA: Diagnosis not present

## 2018-12-16 DIAGNOSIS — H2589 Other age-related cataract: Secondary | ICD-10-CM | POA: Diagnosis not present

## 2018-12-16 DIAGNOSIS — I10 Essential (primary) hypertension: Secondary | ICD-10-CM | POA: Diagnosis not present

## 2018-12-22 ENCOUNTER — Ambulatory Visit (INDEPENDENT_AMBULATORY_CARE_PROVIDER_SITE_OTHER): Payer: Medicaid Other

## 2018-12-22 DIAGNOSIS — Z95810 Presence of automatic (implantable) cardiac defibrillator: Secondary | ICD-10-CM | POA: Diagnosis not present

## 2018-12-22 DIAGNOSIS — I5022 Chronic systolic (congestive) heart failure: Secondary | ICD-10-CM | POA: Diagnosis not present

## 2018-12-22 NOTE — Progress Notes (Signed)
EPIC Encounter for ICM Monitoring  Patient Name: Travis Tran is a 63 y.o. male Date: 12/22/2018 Primary Care Physican: Maren Reamer, MD (Inactive) Primary Cardiologist:Smith/Bensimhon Electrophysiologist: Camnitz LastWeight: [phone] lbs) Today's Weight: 153 lbs (148-152 lbs)  Spoke with sister, DPR.  Heart Failure questions reviewed, pt asymptomatic.  Thoracic impedancenormal.  Prescribed:Furosemide 40 mg1 tablet daily.   Labs: 10/14/2018 Creatinine 1.27, BUN 22, Potassium 4.7, Sodium 141, eGFR 61-71 04/15/2018 Creatinine 1.28, BUN 20, Potassium 3.9, Sodium 141, EGFR 61-70 11/07/2017 Creatinine 1.34, BUN 28, Potassium 4.7, Sodium 134, EGFR 58 Care Everywhere 07/11/2017 Creatinine 1.39, BUN 28, Potassium 5.1, Sodium 138, EGFR 55-64  Recommendations:No changes.   Follow-up plan: ICM clinic phone appointment on2/24/2020.   Copy of ICM check sent to [facility].  3 month ICM trend: 12/22/2018    1 Year ICM trend:       Rosalene Billings, RN 12/22/2018 3:52 PM

## 2019-01-15 ENCOUNTER — Ambulatory Visit (INDEPENDENT_AMBULATORY_CARE_PROVIDER_SITE_OTHER): Payer: Medicaid Other

## 2019-01-15 DIAGNOSIS — I255 Ischemic cardiomyopathy: Secondary | ICD-10-CM

## 2019-01-16 LAB — CUP PACEART REMOTE DEVICE CHECK
Battery Remaining Longevity: 113 mo
Battery Voltage: 3 V
Brady Statistic RV Percent Paced: 0 %
Date Time Interrogation Session: 20200214083521
HighPow Impedance: 66 Ohm
Lead Channel Impedance Value: 247 Ohm
Lead Channel Impedance Value: 342 Ohm
Lead Channel Sensing Intrinsic Amplitude: 15.5 mV
Lead Channel Setting Pacing Amplitude: 2.5 V
MDC IDC LEAD IMPLANT DT: 20170808
MDC IDC LEAD LOCATION: 753860
MDC IDC MSMT LEADCHNL RV PACING THRESHOLD AMPLITUDE: 0.625 V
MDC IDC MSMT LEADCHNL RV PACING THRESHOLD PULSEWIDTH: 0.4 ms
MDC IDC MSMT LEADCHNL RV SENSING INTR AMPL: 15.5 mV
MDC IDC PG IMPLANT DT: 20170808
MDC IDC SET LEADCHNL RV PACING PULSEWIDTH: 0.4 ms
MDC IDC SET LEADCHNL RV SENSING SENSITIVITY: 0.3 mV

## 2019-01-26 ENCOUNTER — Ambulatory Visit (INDEPENDENT_AMBULATORY_CARE_PROVIDER_SITE_OTHER): Payer: Medicaid Other

## 2019-01-26 DIAGNOSIS — I5022 Chronic systolic (congestive) heart failure: Secondary | ICD-10-CM | POA: Diagnosis not present

## 2019-01-26 DIAGNOSIS — Z95810 Presence of automatic (implantable) cardiac defibrillator: Secondary | ICD-10-CM

## 2019-01-26 NOTE — Progress Notes (Signed)
EPIC Encounter for ICM Monitoring  Patient Name: Travis Tran is a 63 y.o. male Date: 01/26/2019 Primary Care Physican: Maren Reamer, MD (Inactive) Primary Cardiologist:Smith/Bensimhon Electrophysiologist: Camnitz LastWeight: [phone] lbs) Today's Weight:152 lbs (148-152 lbs)  Heart Failure questions reviewed, pt reports feeling very fatigued for several days this month.  Thoracic impedanceabnormal suggesting fluid accumulation since 01/05/2019 with exception of a few days at baseline.  Prescribed:Furosemide 40 mg1 tablet daily.   Labs: 10/14/2018 Creatinine 1.27, BUN 22, Potassium 4.7, Sodium 141, eGFR 61-71 04/15/2018 Creatinine 1.28, BUN 20, Potassium 3.9, Sodium 141, EGFR 61-70 11/07/2017 Creatinine 1.34, BUN 28, Potassium 4.7, Sodium 134, EGFR 58 Care Everywhere 07/11/2017 Creatinine 1.39, BUN 28, Potassium 5.1, Sodium 138, EGFR 55-64  Recommendations: Advised to limit salt intake and call back if he develops any fluid symptoms.   Follow-up plan: ICM clinic phone appointment on3/08/2019 to recheck fluid levels.   Copy of ICM check sent to [facility] and Dr Haroldine Laws.   3 month ICM trend: 01/26/2019    1 Year ICM trend:       Rosalene Billings, RN 01/26/2019 11:07 AM

## 2019-01-27 NOTE — Progress Notes (Signed)
Remote ICD transmission.   

## 2019-02-04 DIAGNOSIS — N289 Disorder of kidney and ureter, unspecified: Secondary | ICD-10-CM | POA: Diagnosis not present

## 2019-02-04 DIAGNOSIS — Z79899 Other long term (current) drug therapy: Secondary | ICD-10-CM | POA: Diagnosis not present

## 2019-02-04 DIAGNOSIS — L4050 Arthropathic psoriasis, unspecified: Secondary | ICD-10-CM | POA: Diagnosis not present

## 2019-02-04 DIAGNOSIS — L409 Psoriasis, unspecified: Secondary | ICD-10-CM | POA: Diagnosis not present

## 2019-02-09 ENCOUNTER — Telehealth: Payer: Self-pay

## 2019-02-09 ENCOUNTER — Ambulatory Visit (INDEPENDENT_AMBULATORY_CARE_PROVIDER_SITE_OTHER): Payer: Medicaid Other

## 2019-02-09 DIAGNOSIS — I5022 Chronic systolic (congestive) heart failure: Secondary | ICD-10-CM

## 2019-02-09 DIAGNOSIS — Z95810 Presence of automatic (implantable) cardiac defibrillator: Secondary | ICD-10-CM

## 2019-02-09 NOTE — Telephone Encounter (Signed)
Spoke with patient to remind of missed remote transmission 

## 2019-02-11 ENCOUNTER — Other Ambulatory Visit: Payer: Self-pay | Admitting: Cardiology

## 2019-02-11 NOTE — Progress Notes (Signed)
EPIC Encounter for ICM Monitoring  Patient Name: Travis Tran is a 63 y.o. male Date: 02/11/2019 Primary Care Physican: Pete Glatter, MD (Inactive) Primary Cardiologist:Smith/Bensimhon Electrophysiologist: Camnitz LastWeight: [phone] lbs) Today's Weight:unknown  Heart Failure questions reviewed, pt reports the feeling of fatigue has resolved.  Thoracic impedance trending toward baseline but has been decreased since 02/02/2019 suggesting fluid accumulation.  Impedance did return to normal since 2/24 remote transmission but then accumulated again.   Prescribed:Furosemide 40 mg1 tablet daily.   Labs: 10/14/2018 Creatinine 1.27, BUN 22, Potassium 4.7, Sodium 141, GFR 61-71 04/15/2018 Creatinine 1.28, BUN 20, Potassium 3.9, Sodium 141, GFR 61-70 11/07/2017 Creatinine 1.34, BUN 28, Potassium 4.7, Sodium 134, GFR 58 Care Everywhere 07/11/2017 Creatinine 1.39, BUN 28, Potassium 5.1, Sodium 138, GFR 55-64  Recommendations: Advised to limit salt intake and call back if he develops any fluid symptoms.   Follow-up plan: ICM clinic phone appointment on3/30/2020 to recheck fluid levels and 31 day check.   Copy of ICM check sent to [facility] and Dr Gala Romney.   3 month ICM trend: 02/10/2019    1 Year ICM trend:       Karie Soda, RN 02/11/2019 9:20 AM

## 2019-03-02 ENCOUNTER — Other Ambulatory Visit: Payer: Self-pay

## 2019-03-02 ENCOUNTER — Ambulatory Visit (INDEPENDENT_AMBULATORY_CARE_PROVIDER_SITE_OTHER): Payer: Medicaid Other

## 2019-03-02 DIAGNOSIS — Z95810 Presence of automatic (implantable) cardiac defibrillator: Secondary | ICD-10-CM

## 2019-03-02 DIAGNOSIS — I5022 Chronic systolic (congestive) heart failure: Secondary | ICD-10-CM | POA: Diagnosis not present

## 2019-03-04 NOTE — Progress Notes (Signed)
EPIC Encounter for ICM Monitoring  Patient Name: Travis Tran is a 63 y.o. male Date: 03/04/2019 Primary Care Physican: Pete Glatter, MD (Inactive) Primary Cardiologist:Smith/Bensimhon Electrophysiologist: Camnitz LastWeight: [phone] lbs) Today's Weight: 153 lbs  Heart Failure questions reviewed, pt asymptomatic. He reports he is feeling well.  He said Dr Katrinka Blazing had asked him to send a list for review of cardiologist names that patient can go to in the event of an emergency in his area. .    The follow list sent to Dr Katrinka Blazing for review: Dr Alejandro Mulling, Dr Radonna Ricker, Dr Ricardo Jericho, Dr Fanny Skates all with [facility].  Advised he will get a call back after Dr Katrinka Blazing reviews the list.   Thoracic impedance normal.   Prescribed:Furosemide 40 mg1 tablet daily.   Labs: 10/14/2018 Creatinine 1.27, BUN 22, Potassium 4.7, Sodium 141, GFR 61-71 04/15/2018 Creatinine 1.28, BUN 20, Potassium 3.9, Sodium 141, GFR 61-70 11/07/2017 Creatinine 1.34, BUN 28, Potassium 4.7, Sodium 134, GFR 58 Care Everywhere 07/11/2017 Creatinine 1.39, BUN 28, Potassium 5.1, Sodium 138, GFR 55-64  Recommendations: Advised to limit salt intake and call back if he develops any fluid symptoms.  Follow-up plan: ICM clinic phone appointment on5/13/2020.  Copy of ICM check sent to [facility].  3 month ICM trend: 03/03/2019    1 Year ICM trend:       Karie Soda, RN 03/04/2019 2:45 PM

## 2019-04-15 ENCOUNTER — Other Ambulatory Visit: Payer: Self-pay

## 2019-04-15 ENCOUNTER — Ambulatory Visit (INDEPENDENT_AMBULATORY_CARE_PROVIDER_SITE_OTHER): Payer: Medicaid Other

## 2019-04-15 DIAGNOSIS — I5022 Chronic systolic (congestive) heart failure: Secondary | ICD-10-CM | POA: Diagnosis not present

## 2019-04-15 DIAGNOSIS — Z95810 Presence of automatic (implantable) cardiac defibrillator: Secondary | ICD-10-CM | POA: Diagnosis not present

## 2019-04-16 ENCOUNTER — Ambulatory Visit (INDEPENDENT_AMBULATORY_CARE_PROVIDER_SITE_OTHER): Payer: Medicaid Other | Admitting: *Deleted

## 2019-04-16 ENCOUNTER — Other Ambulatory Visit: Payer: Self-pay

## 2019-04-16 DIAGNOSIS — I255 Ischemic cardiomyopathy: Secondary | ICD-10-CM | POA: Diagnosis not present

## 2019-04-16 LAB — CUP PACEART REMOTE DEVICE CHECK
Battery Remaining Longevity: 107 mo
Battery Voltage: 3 V
Brady Statistic RV Percent Paced: 0 %
Date Time Interrogation Session: 20200513103524
HighPow Impedance: 70 Ohm
Implantable Lead Implant Date: 20170808
Implantable Lead Location: 753860
Implantable Pulse Generator Implant Date: 20170808
Lead Channel Impedance Value: 285 Ohm
Lead Channel Impedance Value: 361 Ohm
Lead Channel Pacing Threshold Amplitude: 0.625 V
Lead Channel Pacing Threshold Pulse Width: 0.4 ms
Lead Channel Sensing Intrinsic Amplitude: 18.875 mV
Lead Channel Setting Pacing Amplitude: 2.5 V
Lead Channel Setting Pacing Pulse Width: 0.4 ms
Lead Channel Setting Sensing Sensitivity: 0.3 mV

## 2019-04-17 NOTE — Progress Notes (Signed)
EPIC Encounter for ICM Monitoring  Patient Name: Travis Tran is a 63 y.o. male Date: 04/17/2019 Primary Care Physican: Pete Glatter, MD (Inactive) Primary Cardiologist:Smith/Bensimhon Electrophysiologist: Camnitz LastWeight: [phone]) 04/17/2019 Weight: 153 lbs  Heart Failure questions reviewed, pt asymptomatic. He reports he is feeling well.       Optivol Thoracic impedancenormal.   Prescribed:Furosemide 40 mg1 tablet daily.   Labs: 10/14/2018 Creatinine 1.27, BUN 22, Potassium 4.7, Sodium 141, GFR 61-71 04/15/2018 Creatinine 1.28, BUN 20, Potassium 3.9, Sodium 141, GFR 61-70 11/07/2017 Creatinine 1.34, BUN 28, Potassium 4.7, Sodium 134, GFR 58 Care Everywhere 07/11/2017 Creatinine 1.39, BUN 28, Potassium 5.1, Sodium 138, GFR 55-64  Recommendations: Advised to limit salt intake and call back if he develops any fluid symptoms.  Follow-up plan: ICM clinic phone appointment on6/15/2020.  Copy of ICM check sent to [facility].   3 month ICM trend: 04/15/2019    1 Year ICM trend:       Karie Soda, RN 04/17/2019 12:19 PM

## 2019-04-21 DIAGNOSIS — D649 Anemia, unspecified: Secondary | ICD-10-CM | POA: Diagnosis not present

## 2019-04-23 ENCOUNTER — Encounter: Payer: Self-pay | Admitting: Cardiology

## 2019-04-23 NOTE — Progress Notes (Signed)
Remote ICD transmission.   

## 2019-04-30 ENCOUNTER — Other Ambulatory Visit: Payer: Self-pay | Admitting: Cardiology

## 2019-05-06 ENCOUNTER — Telehealth (HOSPITAL_COMMUNITY): Payer: Self-pay

## 2019-05-06 ENCOUNTER — Telehealth: Payer: Self-pay | Admitting: Cardiology

## 2019-05-06 NOTE — Telephone Encounter (Signed)
PA approved for Entresto via [employer] effective 05/06/19 to end date 04/30/20

## 2019-05-06 NOTE — Telephone Encounter (Signed)
PA submitted via [employer] for [employer].

## 2019-05-06 NOTE — Telephone Encounter (Signed)
New Message            Travis Tran is calling from cover my Meds checking to see if we rec'vd the authrozation form for patients medication? Pls call to advise.

## 2019-05-12 DIAGNOSIS — Z79899 Other long term (current) drug therapy: Secondary | ICD-10-CM | POA: Diagnosis not present

## 2019-05-12 DIAGNOSIS — N289 Disorder of kidney and ureter, unspecified: Secondary | ICD-10-CM | POA: Diagnosis not present

## 2019-05-12 DIAGNOSIS — L4050 Arthropathic psoriasis, unspecified: Secondary | ICD-10-CM | POA: Diagnosis not present

## 2019-05-12 DIAGNOSIS — L409 Psoriasis, unspecified: Secondary | ICD-10-CM | POA: Diagnosis not present

## 2019-05-18 ENCOUNTER — Ambulatory Visit (INDEPENDENT_AMBULATORY_CARE_PROVIDER_SITE_OTHER): Payer: Medicaid Other

## 2019-05-18 DIAGNOSIS — I5022 Chronic systolic (congestive) heart failure: Secondary | ICD-10-CM | POA: Diagnosis not present

## 2019-05-18 DIAGNOSIS — Z95810 Presence of automatic (implantable) cardiac defibrillator: Secondary | ICD-10-CM

## 2019-05-19 ENCOUNTER — Telehealth: Payer: Self-pay

## 2019-05-19 NOTE — Progress Notes (Signed)
EPIC Encounter for ICM Monitoring  Patient Name: Travis Tran is a 63 y.o. male Date: 05/19/2019 Primary Care Physican: Maren Reamer, MD (Inactive) Primary Cardiologist:Smith/Bensimhon Electrophysiologist: Camnitz LastWeight: [phone] lbs) 04/17/2019 Weight:153 lbs  Attempted call to patient and unable to reach.  Left message to return call. Transmission reviewed.     Optivol thoracic impedancesuggesting possible fluid accumulation starting 05/12/2019 but starting to trend toward baseline.   Prescribed:Furosemide 40 mg1 tablet daily.   Labs: 10/14/2018 Creatinine 1.27, BUN 22, Potassium 4.7, Sodium 141, GFR 61-71 04/15/2018 Creatinine 1.28, BUN 20, Potassium 3.9, Sodium 141, GFR 61-70 11/07/2017 Creatinine 1.34, BUN 28, Potassium 4.7, Sodium 134, GFR 58 Care Everywhere 07/11/2017 Creatinine 1.39, BUN 28, Potassium 5.1, Sodium 138, GFR 55-64  Recommendations: Unable to reach.    Follow-up plan: ICM clinic phone appointment on6/23/2020 to recheck fluid symptoms.  Copy of ICM check sent to [facility] and Dr Haroldine Laws.  3 month ICM trend: 05/18/2019    1 Year ICM trend:       Rosalene Billings, RN 05/19/2019 12:34 PM

## 2019-05-19 NOTE — Telephone Encounter (Signed)
Remote ICM transmission received.  Attempted call to patient regarding ICM remote transmission and left message, per DPR, to return call.    

## 2019-05-26 ENCOUNTER — Ambulatory Visit (INDEPENDENT_AMBULATORY_CARE_PROVIDER_SITE_OTHER): Payer: Medicaid Other

## 2019-05-26 ENCOUNTER — Telehealth: Payer: Self-pay

## 2019-05-26 DIAGNOSIS — Z95810 Presence of automatic (implantable) cardiac defibrillator: Secondary | ICD-10-CM

## 2019-05-26 DIAGNOSIS — I5022 Chronic systolic (congestive) heart failure: Secondary | ICD-10-CM

## 2019-05-26 NOTE — Telephone Encounter (Signed)
Left message for patient to remind of missed remote transmission.  

## 2019-05-29 NOTE — Progress Notes (Signed)
EPIC Encounter for ICM Monitoring  Patient Name: Travis Tran is a 63 y.o. male Date: 05/29/2019 Primary Care Physican: Maren Reamer, MD (Inactive) Primary Cardiologist:Smith/Bensimhon Electrophysiologist: Camnitz LastWeight: [phone] lbs) 5/15/2020Weight:153 lbs  Transmission reviewed and results sent via mychart.   Optivolthoracic impedancereturned to normal.   Prescribed:Furosemide 40 mg1 tablet daily.   Labs: 10/14/2018 Creatinine 1.27, BUN 22, Potassium 4.7, Sodium 141, GFR 61-71 04/15/2018 Creatinine 1.28, BUN 20, Potassium 3.9, Sodium 141, GFR 61-70 11/07/2017 Creatinine 1.34, BUN 28, Potassium 4.7, Sodium 134, GFR 58 Care Everywhere 07/11/2017 Creatinine 1.39, BUN 28, Potassium 5.1, Sodium 138, GFR 55-64  Recommendations:Limit salt and fluid intake recommended via mychart    Follow-up plan: ICM clinic phone appointment on7/20/2020.  Copy of ICM check sent to [facility].  3 month ICM trend: 05/26/2019    1 Year ICM trend:       Rosalene Billings, RN 05/29/2019 12:07 PM

## 2019-06-18 ENCOUNTER — Other Ambulatory Visit (HOSPITAL_COMMUNITY): Payer: Self-pay | Admitting: Internal Medicine

## 2019-06-22 ENCOUNTER — Ambulatory Visit (INDEPENDENT_AMBULATORY_CARE_PROVIDER_SITE_OTHER): Payer: Medicaid Other

## 2019-06-22 DIAGNOSIS — I5022 Chronic systolic (congestive) heart failure: Secondary | ICD-10-CM

## 2019-06-22 DIAGNOSIS — Z95810 Presence of automatic (implantable) cardiac defibrillator: Secondary | ICD-10-CM

## 2019-06-24 NOTE — Progress Notes (Signed)
EPIC Encounter for ICM Monitoring  Patient Name: Travis Tran is a 63 y.o. male Date: 06/24/2019 Primary Care Physican: Maren Reamer, MD (Inactive) Primary Cardiologist:Smith/Bensimhon Electrophysiologist: Camnitz 5/15/2020Weight:153 lbs 06/24/2019 Weight: 149 - 151 lbs  Spoke with patient and .   Optivolthoracic impedancereturned to normal.   Prescribed:Furosemide 40 mg1 tablet daily.   Labs: 10/14/2018 Creatinine 1.27, BUN 22, Potassium 4.7, Sodium 141, GFR 61-71 04/15/2018 Creatinine 1.28, BUN 20, Potassium 3.9, Sodium 141, GFR 61-70 11/07/2017 Creatinine 1.34, BUN 28, Potassium 4.7, Sodium 134, GFR 58 Care Everywhere 07/11/2017 Creatinine 1.39, BUN 28, Potassium 5.1, Sodium 138, GFR 55-64  Recommendations:Limit salt and fluid intake recommended via mychart  Follow-up plan: ICM clinic phone appointment on7/20/2020.  Copy of ICM check sent to [facility].  3 month ICM trend: 06/24/2019    1 Year ICM trend:       Rosalene Billings, RN 06/24/2019 4:46 PM

## 2019-07-16 ENCOUNTER — Encounter: Payer: Medicaid Other | Admitting: *Deleted

## 2019-07-17 NOTE — Progress Notes (Signed)
ICM remote transmission rescheduled for 09/14/2019.

## 2019-07-21 ENCOUNTER — Ambulatory Visit (INDEPENDENT_AMBULATORY_CARE_PROVIDER_SITE_OTHER): Payer: Medicaid Other | Admitting: Cardiology

## 2019-07-21 ENCOUNTER — Other Ambulatory Visit: Payer: Self-pay

## 2019-07-21 ENCOUNTER — Encounter: Payer: Self-pay | Admitting: Cardiology

## 2019-07-21 VITALS — BP 104/58 | HR 80 | Ht 66.0 in | Wt 153.0 lb

## 2019-07-21 DIAGNOSIS — I255 Ischemic cardiomyopathy: Secondary | ICD-10-CM | POA: Diagnosis not present

## 2019-07-21 DIAGNOSIS — R748 Abnormal levels of other serum enzymes: Secondary | ICD-10-CM | POA: Diagnosis not present
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
## 2019-07-27 ENCOUNTER — Encounter: Payer: Self-pay | Admitting: Cardiology

## 2019-07-27 ENCOUNTER — Other Ambulatory Visit: Payer: Self-pay | Admitting: Cardiology

## 2019-08-12 DIAGNOSIS — E860 Dehydration: Secondary | ICD-10-CM | POA: Diagnosis not present

## 2019-08-19 DIAGNOSIS — I509 Heart failure, unspecified: Secondary | ICD-10-CM | POA: Diagnosis not present

## 2019-08-19 DIAGNOSIS — I25119 Atherosclerotic heart disease of native coronary artery with unspecified angina pectoris: Secondary | ICD-10-CM | POA: Diagnosis not present

## 2019-08-19 DIAGNOSIS — E1022 Type 1 diabetes mellitus with diabetic chronic kidney disease: Secondary | ICD-10-CM | POA: Diagnosis not present

## 2019-08-19 DIAGNOSIS — I255 Ischemic cardiomyopathy: Secondary | ICD-10-CM | POA: Diagnosis not present

## 2019-08-19 DIAGNOSIS — Z6824 Body mass index (BMI) 24.0-24.9, adult: Secondary | ICD-10-CM | POA: Diagnosis not present

## 2019-08-19 DIAGNOSIS — Z79899 Other long term (current) drug therapy: Secondary | ICD-10-CM | POA: Diagnosis not present

## 2019-08-19 DIAGNOSIS — K4090 Unilateral inguinal hernia, without obstruction or gangrene, not specified as recurrent: Secondary | ICD-10-CM | POA: Diagnosis not present

## 2019-08-19 DIAGNOSIS — M05732 Rheumatoid arthritis with rheumatoid factor of left wrist without organ or systems involvement: Secondary | ICD-10-CM | POA: Diagnosis not present

## 2019-08-19 DIAGNOSIS — Z299 Encounter for prophylactic measures, unspecified: Secondary | ICD-10-CM | POA: Diagnosis not present

## 2019-08-19 DIAGNOSIS — N183 Chronic kidney disease, stage 3 (moderate): Secondary | ICD-10-CM | POA: Diagnosis not present

## 2019-08-19 DIAGNOSIS — I10 Essential (primary) hypertension: Secondary | ICD-10-CM | POA: Diagnosis not present

## 2019-08-27 DIAGNOSIS — K4020 Bilateral inguinal hernia, without obstruction or gangrene, not specified as recurrent: Secondary | ICD-10-CM | POA: Diagnosis not present

## 2019-08-27 DIAGNOSIS — K4091 Unilateral inguinal hernia, without obstruction or gangrene, recurrent: Secondary | ICD-10-CM | POA: Diagnosis not present

## 2019-09-04 DIAGNOSIS — Z23 Encounter for immunization: Secondary | ICD-10-CM | POA: Diagnosis not present

## 2019-09-14 ENCOUNTER — Ambulatory Visit (INDEPENDENT_AMBULATORY_CARE_PROVIDER_SITE_OTHER): Payer: Medicaid Other

## 2019-09-14 DIAGNOSIS — I5022 Chronic systolic (congestive) heart failure: Secondary | ICD-10-CM

## 2019-09-14 DIAGNOSIS — Z95810 Presence of automatic (implantable) cardiac defibrillator: Secondary | ICD-10-CM | POA: Diagnosis not present

## 2019-09-16 ENCOUNTER — Telehealth: Payer: Self-pay

## 2019-09-16 DIAGNOSIS — I63232 Cerebral infarction due to unspecified occlusion or stenosis of left carotid arteries: Secondary | ICD-10-CM

## 2019-09-16 DIAGNOSIS — Z95810 Presence of automatic (implantable) cardiac defibrillator: Secondary | ICD-10-CM

## 2019-09-16 DIAGNOSIS — I25810 Atherosclerosis of coronary artery bypass graft(s) without angina pectoris: Secondary | ICD-10-CM

## 2019-09-16 DIAGNOSIS — Q240 Dextrocardia: Secondary | ICD-10-CM

## 2019-09-16 DIAGNOSIS — I10 Essential (primary) hypertension: Secondary | ICD-10-CM

## 2019-09-16 DIAGNOSIS — I5022 Chronic systolic (congestive) heart failure: Secondary | ICD-10-CM

## 2019-09-16 MED ORDER — SPIRONOLACTONE 25 MG PO TABS: 12.5000 mg | ORAL_TABLET | ORAL | 3 refills | Status: DC

## 2019-09-16 NOTE — Telephone Encounter (Signed)
Spironolactone RX sent to [facility] in [place], [place] to fill per request.

## 2019-09-16 NOTE — Progress Notes (Signed)
EPIC Encounter for ICM Monitoring  Patient Name: Travis Tran is a 63 y.o. male Date: 09/16/2019 Primary Care Physican: Patient, No Pcp Per Primary Cardiologist:Smith/Bensimhon Electrophysiologist: Camnitz 09/16/2019 Weight: 150 lbs  Spoke with patient and he denies any fluid symptoms.  He says he ate at [employer] for a few days since 10/5 and not limiting salt intake like he should.   Patient requested Spironolactone refill and phone note routed to refill department.  Optivolthoracic impedancesuggesting possible fluid accumulation since 09/07/2019.   Prescribed:Furosemide 40 mg1 tablet daily.   Labs: 07/21/2019 Creatinine 1.51, BUN 38, Potassium 4.0, Sodium 134, GFR 49  Care Everywhere 05/12/2019 Creatinine 1.20, BUN 31, Potassium 4.2, Sodium 138, GFR 65  Care Everywhere 02/04/2019 Creatinine 1.15, BUN 28, Potassium 3.8, Sodium 141, GFR 69  Care Everywhere 12/10/2018 Creatinine 1.36, BUN 26, Potassium 4.5, Sodium 137, GFR 56-65 Care Everywhere A complete set of results can be found in Results Review.  Recommendations:  Reinforced limiting salt intake to < 2000 mg daily.   Advised to avoid restaurant foods which are typically high in salt.  Encouraged to call if experiencing fluid symptoms.  Follow-up plan: ICM clinic phone appointment on 10/20/020 (manual send) to recheck fluid levels.   91 day device clinic remote transmission 10/19/2019.  Office appt 10/14/2019 with Cecilie Kicks, NP.    Copy of ICM check sent to Dr. Curt Bears and Dr Haroldine Laws.   3 month ICM trend: 09/15/2019    1 Year ICM trend:       Rosalene Billings, RN 09/16/2019 12:22 PM

## 2019-09-16 NOTE — Telephone Encounter (Signed)
Spoke with patient.  He requested Spironolactone 25 mg refill.  Confirmed he takes 0.5 tablet on Monday, Wednesday and Fridays.  His pharmacy is [facility] [place].  He has 6 tablets left and no refills.  Advised will route to refill department to follow up.

## 2019-09-21 ENCOUNTER — Other Ambulatory Visit (HOSPITAL_COMMUNITY): Payer: Self-pay

## 2019-09-21 MED ORDER — CARVEDILOL 6.25 MG PO TABS: 6.2500 mg | ORAL_TABLET | Freq: Two times a day (BID) | ORAL | 0 refills | Status: DC

## 2019-09-22 ENCOUNTER — Ambulatory Visit (INDEPENDENT_AMBULATORY_CARE_PROVIDER_SITE_OTHER): Payer: Medicaid Other

## 2019-09-22 DIAGNOSIS — Z95810 Presence of automatic (implantable) cardiac defibrillator: Secondary | ICD-10-CM

## 2019-09-22 DIAGNOSIS — I5022 Chronic systolic (congestive) heart failure: Secondary | ICD-10-CM

## 2019-09-25 NOTE — Progress Notes (Signed)
EPIC Encounter for ICM Monitoring  Patient Name: Travis Tran is a 63 y.o. male Date: 09/25/2019 Primary Care Physican: Patient, No Pcp Per Primary Cardiologist:Smith/Bensimhon Electrophysiologist: Camnitz 09/16/2019 Weight: 150 lbs  Transmission reviewed.  Optivolthoracic impedancereturned to normal.   Prescribed:Furosemide 40 mg1 tablet daily.   Labs: 07/21/2019 Creatinine 1.51, BUN 38, Potassium 4.0, Sodium 134, GFR 49  Care Everywhere 05/12/2019 Creatinine 1.20, BUN 31, Potassium 4.2, Sodium 138, GFR 65  Care Everywhere 02/04/2019 Creatinine 1.15, BUN 28, Potassium 3.8, Sodium 141, GFR 69  Care Everywhere 12/10/2018 Creatinine 1.36, BUN 26, Potassium 4.5, Sodium 137, GFR 56-65 Care Everywhere A complete set of results can be found in Results Review.  Recommendations: None  Follow-up plan: ICM clinic phone appointment on 10/20/2019.  91 day device clinic remote transmission 10/19/2019.  Office appt 10/14/2019 with Cecilie Kicks, NP.    Copy of ICM check sent to Dr. Curt Bears  3 month ICM trend: 09/23/2019    1 Year ICM trend:       Rosalene Billings, RN 09/25/2019 2:17 PM

## 2019-10-07 ENCOUNTER — Other Ambulatory Visit: Payer: Self-pay | Admitting: Interventional Cardiology
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
## 2019-10-14 ENCOUNTER — Encounter: Payer: Self-pay | Admitting: Cardiology

## 2019-10-14 ENCOUNTER — Other Ambulatory Visit: Payer: Self-pay

## 2019-10-14 ENCOUNTER — Ambulatory Visit: Payer: Medicaid Other | Admitting: Cardiology

## 2019-10-14 VITALS — BP 124/72 | HR 61 | Ht 66.0 in | Wt 153.6 lb

## 2019-10-14 DIAGNOSIS — I255 Ischemic cardiomyopathy: Secondary | ICD-10-CM | POA: Diagnosis not present

## 2019-10-14 DIAGNOSIS — Z95810 Presence of automatic (implantable) cardiac defibrillator: Secondary | ICD-10-CM | POA: Diagnosis not present

## 2019-10-14 DIAGNOSIS — I25810 Atherosclerosis of coronary artery bypass graft(s) without angina pectoris: Secondary | ICD-10-CM

## 2019-10-14 DIAGNOSIS — I5022 Chronic systolic (congestive) heart failure: Secondary | ICD-10-CM

## 2019-10-14 DIAGNOSIS — E1152 Type 2 diabetes mellitus with diabetic peripheral angiopathy with gangrene: Secondary | ICD-10-CM | POA: Diagnosis not present

## 2019-10-14 DIAGNOSIS — I480 Paroxysmal atrial fibrillation: Secondary | ICD-10-CM

## 2019-10-14 DIAGNOSIS — I10 Essential (primary) hypertension: Secondary | ICD-10-CM | POA: Diagnosis not present

## 2019-10-14 DIAGNOSIS — Q240 Dextrocardia: Secondary | ICD-10-CM | POA: Diagnosis not present

## 2019-10-14 NOTE — Patient Instructions (Signed)
Medication Instructions:  Your physician recommends that you continue on your current medications as directed. Please refer to the Current Medication list given to you today.  *If you need a refill on your cardiac medications before your next appointment, please call your pharmacy*  Lab Work: None   If you have labs (blood work) drawn today and your tests are completely normal, you will receive your results only by: [name] MyChart Message (if you have MyChart) OR . A paper copy in the mail If you have any lab test that is abnormal or we need to change your treatment, we will call you to review the results.  Testing/Procedures: None   Follow-Up: At [facility], you and your health needs are our priority.  As part of our continuing mission to provide you with exceptional heart care, we have created designated Provider Care Teams.  These Care Teams include your primary Cardiologist (physician) and Advanced Practice Providers (APPs -  Physician Assistants and Nurse Practitioners) who all work together to provide you with the care you need, when you need it.  Your next appointment:   6 months  The format for your next appointment:   In Person  Provider:   You may see Sinclair Grooms, MD or one of the following Advanced Practice Providers on your designated Care Team:    Truitt Merle, NP  Cecilie Kicks, NP  Kathyrn Drown, NP  (Call in 3 months to schedule)   Other Instructions

## 2019-10-15 DIAGNOSIS — E119 Type 2 diabetes mellitus without complications: Secondary | ICD-10-CM | POA: Diagnosis not present

## 2019-10-15 DIAGNOSIS — I2510 Atherosclerotic heart disease of native coronary artery without angina pectoris: Secondary | ICD-10-CM | POA: Diagnosis not present

## 2019-10-15 DIAGNOSIS — I10 Essential (primary) hypertension: Secondary | ICD-10-CM | POA: Diagnosis not present

## 2019-10-15 DIAGNOSIS — E785 Hyperlipidemia, unspecified: Secondary | ICD-10-CM | POA: Diagnosis not present

## 2019-10-15 DIAGNOSIS — Z79899 Other long term (current) drug therapy: Secondary | ICD-10-CM | POA: Diagnosis not present

## 2019-10-16 NOTE — Progress Notes (Signed)
I did not know any of the ones on list. He needs to get a PCP and be referred to a cardiologist based on PCP preference.

## 2019-10-19 ENCOUNTER — Ambulatory Visit (INDEPENDENT_AMBULATORY_CARE_PROVIDER_SITE_OTHER): Payer: Medicaid Other | Admitting: *Deleted

## 2019-10-19 DIAGNOSIS — I255 Ischemic cardiomyopathy: Secondary | ICD-10-CM | POA: Diagnosis not present

## 2019-10-19 DIAGNOSIS — I5022 Chronic systolic (congestive) heart failure: Secondary | ICD-10-CM

## 2019-10-20 ENCOUNTER — Ambulatory Visit (INDEPENDENT_AMBULATORY_CARE_PROVIDER_SITE_OTHER): Payer: Medicaid Other

## 2019-10-20 DIAGNOSIS — I5022 Chronic systolic (congestive) heart failure: Secondary | ICD-10-CM

## 2019-10-20 DIAGNOSIS — Z95810 Presence of automatic (implantable) cardiac defibrillator: Secondary | ICD-10-CM | POA: Diagnosis not present

## 2019-10-20 LAB — CUP PACEART REMOTE DEVICE CHECK
Battery Remaining Longevity: 97 mo
Battery Voltage: 3 V
Brady Statistic RV Percent Paced: 0 %
Date Time Interrogation Session: 20201116092403
HighPow Impedance: 68 Ohm
Implantable Lead Implant Date: 20170808
Implantable Lead Location: 753860
Implantable Pulse Generator Implant Date: 20170808
Lead Channel Impedance Value: 304 Ohm
Lead Channel Impedance Value: 361 Ohm
Lead Channel Pacing Threshold Amplitude: 0.625 V
Lead Channel Pacing Threshold Pulse Width: 0.4 ms
Lead Channel Sensing Intrinsic Amplitude: 16.375 mV
Lead Channel Sensing Intrinsic Amplitude: 16.375 mV
Lead Channel Setting Pacing Amplitude: 2.5 V
Lead Channel Setting Pacing Pulse Width: 0.4 ms
Lead Channel Setting Sensing Sensitivity: 0.3 mV

## 2019-10-20 NOTE — Progress Notes (Signed)
EPIC Encounter for ICM Monitoring  Patient Name: Travis Tran is a 63 y.o. male Date: 10/20/2019 Primary Care Physican: System, [place] Not In Primary Cardiologist:Smith/Bensimhon Electrophysiologist: [facility] 09/16/2019 Weight: 150lbs  Spoke with patient and he is doing fine. Denies fluid symptoms. He ate [place] food a couple of days ago and advised take food is typically high in salt.  Optivolthoracic impedance suggesting fluid accumulation.   Prescribed:Furosemide 40 mg1 tablet daily.   Labs: 10/15/2019 Creatinine 1.38, BUN 27, Potassium 4.0, Sodium 137, GFR 55-63, Glucose 356, A1C 9.8% 07/21/2019 Creatinine1.51, BUN38, Potassium4.0, [id number], [facility] Care Everywhere 05/12/2019 Creatinine1.20, BUN31, Potassium4.2, Sodium138, [facility] Care Everywhere 02/04/2019 Creatinine1.15, BUN28, Potassium3.8, Sodium141, [place] 12/10/2018 Creatinine1.36, BUN26, Potassium4.5, Sodium137, [place] A complete set of results can be found in Results Review.  Recommendations: Reinforced limiting salt intake to < 2000 mg daily.  Encouraged to call if experiencing fluid symptoms.  Follow-up plan: ICM clinic phone appointment on 10/26/2019 to recheck fluid levels (no call back needed if normal recheck).     Copy of ICM check sent to Dr. Curt Bears and Dr Tamala Julian.   3 month ICM trend: 10/19/2019    1 Year ICM trend:       Rosalene Billings, RN 10/20/2019 2:15 PM

## 2019-10-22 DIAGNOSIS — E1022 Type 1 diabetes mellitus with diabetic chronic kidney disease: Secondary | ICD-10-CM | POA: Diagnosis not present

## 2019-10-22 DIAGNOSIS — I509 Heart failure, unspecified: Secondary | ICD-10-CM | POA: Diagnosis not present

## 2019-10-22 DIAGNOSIS — N183 Chronic kidney disease, stage 3 unspecified: Secondary | ICD-10-CM | POA: Diagnosis not present

## 2019-10-22 DIAGNOSIS — I25119 Atherosclerotic heart disease of native coronary artery with unspecified angina pectoris: Secondary | ICD-10-CM | POA: Diagnosis not present

## 2019-10-26 ENCOUNTER — Ambulatory Visit (INDEPENDENT_AMBULATORY_CARE_PROVIDER_SITE_OTHER): Payer: Medicaid Other

## 2019-10-26 DIAGNOSIS — Z95810 Presence of automatic (implantable) cardiac defibrillator: Secondary | ICD-10-CM

## 2019-10-26 DIAGNOSIS — I5022 Chronic systolic (congestive) heart failure: Secondary | ICD-10-CM

## 2019-10-28 NOTE — Progress Notes (Signed)
EPIC Encounter for ICM Monitoring  Patient Name: Travis Tran is a 63 y.o. male Date: 10/28/2019 Primary Care Physican: System, [place] Not In Primary Cardiologist:Smith/Bensimhon Electrophysiologist: [facility] 09/16/2019 Weight: 150lbs  Transmission reviewed.  Optivolthoracic impedance trending back to baseline normal.   Prescribed:Furosemide 40 mg1 tablet daily.   Labs: 10/15/2019 Creatinine 1.38, BUN 27, Potassium 4.0, Sodium 137, GFR 55-63, Glucose 356, A1C 9.8% 07/21/2019 Creatinine1.51, BUN38, Potassium4.0, [id number], [facility] Care Everywhere 05/12/2019 Creatinine1.20, BUN31, Potassium4.2, Sodium138, [facility] Care Everywhere 02/04/2019 Creatinine1.15, BUN28, Potassium3.8, Sodium141, [place] 12/10/2018 Creatinine1.36, BUN26, Potassium4.5, Sodium137, [place] A complete set of results can be found in Results Review.  Recommendations:  None  Follow-up plan: ICM clinic phone appointment on 12/07/2019.     Copy of ICM check sent to Dr. Curt Bears.  3 month ICM trend: 10/26/2019    1 Year ICM trend:       Rosalene Billings, RN 10/28/2019 11:07 AM

## 2019-11-13 NOTE — Progress Notes (Signed)
Remote ICD transmission.   

## 2019-11-16 ENCOUNTER — Other Ambulatory Visit: Payer: Self-pay | Admitting: Cardiology

## 2019-12-07 ENCOUNTER — Ambulatory Visit (INDEPENDENT_AMBULATORY_CARE_PROVIDER_SITE_OTHER): Payer: Medicaid Other

## 2019-12-07 DIAGNOSIS — I5022 Chronic systolic (congestive) heart failure: Secondary | ICD-10-CM

## 2019-12-07 DIAGNOSIS — Z95810 Presence of automatic (implantable) cardiac defibrillator: Secondary | ICD-10-CM | POA: Diagnosis not present

## 2019-12-08 NOTE — Progress Notes (Signed)
EPIC Encounter for ICM Monitoring  Patient Name: Travis Tran is a 64 y.o. male Date: 12/08/2019 Primary Care Physican: System, Pcp Not In Primary Cardiologist:Smith/Bensimhon Electrophysiologist: [facility] 12/08/2019 Weight: 147lbs  Spoke with patient and he is asymptomatic for fluid accumulation.   Optivolthoracic impedancenormal.   Prescribed:Furosemide 40 mg1 tablet daily.   Labs: 10/15/2019 Creatinine 1.38, BUN 27, Potassium 4.0, Sodium 137, GFR 55-63, Glucose 356, A1C 9.8% 07/21/2019 Creatinine1.51, BUN38, Potassium4.0, Sodium134, [facility] Care Everywhere 05/12/2019 Creatinine1.20, BUN31, Potassium4.2, Sodium138, [facility] Care Everywhere 02/04/2019 Creatinine1.15, BUN28, Potassium3.8, Sodium141, [facility] Care Everywhere 12/10/2018 Creatinine1.36, BUN26, Potassium4.5, Sodium137, [id number] Care Everywhere A complete set of results can be found in Results Review.  Recommendations: No changes and encouraged to call if experiencing any fluid symptoms.  Follow-up plan: ICM clinic phone appointment on 01/19/2020.   91 day device clinic remote transmission 01/18/2020.    Copy of ICM check sent to Dr. Elberta Fortis.   3 month ICM trend: 12/08/2019    1 Year ICM trend:       Karie Soda, RN 12/08/2019 4:53 PM

## 2019-12-24 DIAGNOSIS — Z79899 Other long term (current) drug therapy: Secondary | ICD-10-CM | POA: Diagnosis not present

## 2019-12-24 DIAGNOSIS — L409 Psoriasis, unspecified: Secondary | ICD-10-CM | POA: Diagnosis not present

## 2019-12-24 DIAGNOSIS — L4050 Arthropathic psoriasis, unspecified: Secondary | ICD-10-CM | POA: Diagnosis not present

## 2019-12-30 ENCOUNTER — Other Ambulatory Visit (HOSPITAL_COMMUNITY): Payer: Self-pay

## 2019-12-30 MED ORDER — CARVEDILOL 6.25 MG PO TABS: 6.2500 mg | ORAL_TABLET | Freq: Two times a day (BID) | ORAL | 0 refills | Status: DC

## 2020-01-11 ENCOUNTER — Other Ambulatory Visit: Payer: Self-pay | Admitting: Interventional Cardiology

## 2020-01-18 ENCOUNTER — Ambulatory Visit (INDEPENDENT_AMBULATORY_CARE_PROVIDER_SITE_OTHER): Payer: Medicaid Other | Admitting: *Deleted

## 2020-01-18 DIAGNOSIS — I255 Ischemic cardiomyopathy: Secondary | ICD-10-CM

## 2020-01-18 LAB — CUP PACEART REMOTE DEVICE CHECK
Battery Remaining Longevity: 93 mo
Battery Voltage: 3 V
Brady Statistic RV Percent Paced: 0.01 %
Date Time Interrogation Session: 20210215072204
HighPow Impedance: 62 Ohm
Implantable Lead Implant Date: 20170808
Implantable Lead Location: 753860
Implantable Pulse Generator Implant Date: 20170808
Lead Channel Impedance Value: 247 Ohm
Lead Channel Impedance Value: 342 Ohm
Lead Channel Pacing Threshold Amplitude: 0.625 V
Lead Channel Pacing Threshold Pulse Width: 0.4 ms
Lead Channel Sensing Intrinsic Amplitude: 15.625 mV
Lead Channel Sensing Intrinsic Amplitude: 15.625 mV
Lead Channel Setting Pacing Amplitude: 2.5 V
Lead Channel Setting Pacing Pulse Width: 0.4 ms
Lead Channel Setting Sensing Sensitivity: 0.3 mV

## 2020-01-19 ENCOUNTER — Ambulatory Visit (INDEPENDENT_AMBULATORY_CARE_PROVIDER_SITE_OTHER): Payer: Medicaid Other

## 2020-01-19 DIAGNOSIS — I5022 Chronic systolic (congestive) heart failure: Secondary | ICD-10-CM

## 2020-01-19 DIAGNOSIS — Z95810 Presence of automatic (implantable) cardiac defibrillator: Secondary | ICD-10-CM

## 2020-01-19 NOTE — Progress Notes (Signed)
ICD Remote  

## 2020-01-20 NOTE — Progress Notes (Signed)
EPIC Encounter for ICM Monitoring  Patient Name: Travis Tran is a 64 y.o. male Date: 01/20/2020 Primary Care Physican: System, Pcp Not In Primary Cardiologist:Smith Electrophysiologist: [facility] 01/20/2020 Weight: 147lbs  Spoke with patient and he is asymptomatic for fluid accumulation.  He feels fine at this time.   Optivolthoracic impedancesuggesting possible fluid accumulation since 01/10/2020.   Prescribed:Furosemide 40 mg1 tablet daily.   Labs: 12/24/2019 Creatinine 1.70, BUN 37, Potassium 4.2, Sodium 139, GFR 43 Care Everywhere 10/15/2019 Creatinine 1.38, BUN 27, Potassium 4.0, Sodium 137, GFR 55-63, Glucose 356, A1C 9.8% 07/21/2019 Creatinine1.51, BUN38, Potassium4.0, Sodium134, [facility] Care Everywhere 05/12/2019 Creatinine1.20, BUN31, Potassium4.2, Sodium138, [facility] Care Everywhere 02/04/2019 Creatinine1.15, BUN28, Potassium3.8, Sodium141, [facility] Care Everywhere 12/10/2018 Creatinine1.36, BUN26, Potassium4.5, Sodium137, [id number] Care Everywhere A complete set of results can be found in Results Review.  Recommendations:  Recommendation to limit salt intake to 2000 mg daily and fluid intake to 64 oz daily.  Encouraged to call if experiencing any fluid symptoms.   Follow-up plan: ICM clinic phone appointment on 01/25/2020 to recheck fluid levels.   91 day device clinic remote transmission 04/18/2020.    Copy of ICM check sent to Dr. Elberta Fortis and Dr Katrinka Blazing.   3 month ICM trend: 01/18/2020    1 Year ICM trend:       Karie Soda, RN 01/20/2020 1:31 PM

## 2020-01-25 ENCOUNTER — Ambulatory Visit (INDEPENDENT_AMBULATORY_CARE_PROVIDER_SITE_OTHER): Payer: Medicaid Other

## 2020-01-25 DIAGNOSIS — Z95810 Presence of automatic (implantable) cardiac defibrillator: Secondary | ICD-10-CM

## 2020-01-25 DIAGNOSIS — I5022 Chronic systolic (congestive) heart failure: Secondary | ICD-10-CM

## 2020-01-25 NOTE — Progress Notes (Signed)
EPIC Encounter for ICM Monitoring  Patient Name: Travis Tran is a 64 y.o. male Date: 01/25/2020 Primary Care Physican: System, Pcp Not In Primary Cardiologist:Smith Electrophysiologist: [place] 2/22/2021Weight: 147lbs  Spoke with patient and he is asymptomatic for fluid accumulation. He does not think his dietary salt intake is too high and has avoided restaurant foods.  Optivolthoracic impedancereturned to baseline normal after 2/16 remote transmission but suggesting possible recurrent fluid accumulation since 2/18.   Prescribed:Furosemide 40 mg1 tablet daily.   Labs: 12/24/2019 Creatinine 1.70, BUN 37, Potassium 4.2, Sodium 139, GFR 43 Care Everywhere 10/15/2019 Creatinine 1.38, BUN 27, Potassium 4.0, Sodium 137, GFR 55-63, Glucose 356, A1C 9.8% A complete set of results can be found in Results Review.  Recommendations: Advised to limit salt intake to 2000 mg daily and fluid intake to 64 oz daily.  Encouraged to call if experiencing any fluid symptoms.   Follow-up plan: ICM clinic phone appointment on3/07/2020 to recheck fluid levels. 91 day device clinic remote transmission 04/18/2020.   Copy of ICM check sent to [facility] and Dr Katrinka Blazing.   3 month ICM trend: 01/25/2020    1 Year ICM trend:       Karie Soda, RN 01/25/2020 3:15 PM

## 2020-02-08 ENCOUNTER — Ambulatory Visit (INDEPENDENT_AMBULATORY_CARE_PROVIDER_SITE_OTHER): Payer: Medicaid Other

## 2020-02-08 DIAGNOSIS — Z95810 Presence of automatic (implantable) cardiac defibrillator: Secondary | ICD-10-CM

## 2020-02-08 DIAGNOSIS — Z79899 Other long term (current) drug therapy: Secondary | ICD-10-CM | POA: Diagnosis not present

## 2020-02-08 DIAGNOSIS — E663 Overweight: Secondary | ICD-10-CM | POA: Diagnosis not present

## 2020-02-08 DIAGNOSIS — N183 Chronic kidney disease, stage 3 unspecified: Secondary | ICD-10-CM | POA: Diagnosis not present

## 2020-02-08 DIAGNOSIS — I5022 Chronic systolic (congestive) heart failure: Secondary | ICD-10-CM

## 2020-02-09 ENCOUNTER — Telehealth: Payer: Self-pay

## 2020-02-09 NOTE — Telephone Encounter (Signed)
Remote ICM transmission received.  Attempted call to patient regarding ICM remote transmission and left detailed message per DPR.  Advised to return call for any fluid symptoms or questions. Next ICM remote transmission scheduled 02/29/2020.    

## 2020-02-09 NOTE — Progress Notes (Signed)
EPIC Encounter for ICM Monitoring  Patient Name: Travis Tran is a 64 y.o. male Date: 02/09/2020 Primary Care Physican: System, Pcp Not In Primary Cardiologist:Smith Electrophysiologist: [place] 2/22/2021Weight: 147lbs  Attempted call to patient and unable to reach.  Left detailed message per DPR regarding transmission. Transmission reviewed.   Optivolthoracic impedancereturned close to baseline normal.   Prescribed:Furosemide 40 mg1 tablet daily.   Labs: 12/24/2019 Creatinine 1.70, BUN 37, Potassium 4.2, Sodium 139, GFR 43 Care Everywhere 10/15/2019 Creatinine 1.38, BUN 27, Potassium 4.0, Sodium 137, GFR 55-63, Glucose 356, A1C 9.8% A complete set of results can be found in Results Review.  Recommendations: Left voice mail with ICM number and encouraged to call if experiencing any fluid symptoms.  Follow-up plan: ICM clinic phone appointment on3/29/2021. 91 day device clinic remote transmission5/17/2021.   Copy of ICM check sent to [facility].  3 month ICM trend: 02/09/2020    1 Year ICM trend:       Karie Soda, RN 02/09/2020 2:16 PM

## 2020-02-17 DIAGNOSIS — Z23 Encounter for immunization: Secondary | ICD-10-CM | POA: Diagnosis not present

## 2020-02-18 DIAGNOSIS — I10 Essential (primary) hypertension: Secondary | ICD-10-CM | POA: Diagnosis not present

## 2020-02-18 DIAGNOSIS — E1022 Type 1 diabetes mellitus with diabetic chronic kidney disease: Secondary | ICD-10-CM | POA: Diagnosis not present

## 2020-02-18 DIAGNOSIS — E559 Vitamin D deficiency, unspecified: Secondary | ICD-10-CM | POA: Diagnosis not present

## 2020-02-18 DIAGNOSIS — N183 Chronic kidney disease, stage 3 unspecified: Secondary | ICD-10-CM | POA: Diagnosis not present

## 2020-02-29 ENCOUNTER — Ambulatory Visit (INDEPENDENT_AMBULATORY_CARE_PROVIDER_SITE_OTHER): Payer: Medicaid Other

## 2020-02-29 DIAGNOSIS — Z95810 Presence of automatic (implantable) cardiac defibrillator: Secondary | ICD-10-CM

## 2020-02-29 DIAGNOSIS — I5022 Chronic systolic (congestive) heart failure: Secondary | ICD-10-CM | POA: Diagnosis not present

## 2020-03-01 NOTE — Progress Notes (Signed)
EPIC Encounter for ICM Monitoring  Patient Name: Travis Tran is a 64 y.o. male Date: 03/01/2020 Primary Care Physican: System, Pcp Not In Primary Cardiologist:Smith Electrophysiologist: [place] 3/30/2021Weight: 147lbs  Spoke with patient and he is feeling well at this time.  Denies fluid symptoms.  Patient has been referred to Nephrologist by his PCP but he would like Dr Michaelle Copas opinion if he needs a nephrologist and if so, who does he recommend.  Message sent Dr Katrinka Blazing asking his recommendation.    Optivolthoracic impedancereturned close to baseline normal.   Prescribed:Furosemide 40 mg1 tablet daily.   Labs: 12/24/2019 Creatinine 1.70, BUN 37, Potassium 4.2, Sodium 139, GFR 43 Care Everywhere 10/15/2019 Creatinine 1.38, BUN 27, Potassium 4.0, Sodium 137, GFR 55-63, Glucose 356, A1C 9.8% A complete set of results can be found in Results Review.  Recommendations: No changes and encouraged to call if experiencing any fluid symptoms.   Follow-up plan: ICM clinic phone appointment on5/02/2020. 91 day device clinic remote transmission5/17/2021.   Copy of ICM check sent to [facility].  3 month ICM trend: 02/29/2020    1 Year ICM trend:       Karie Soda, RN 03/01/2020 1:32 PM

## 2020-03-02 ENCOUNTER — Telehealth: Payer: Self-pay

## 2020-03-02 NOTE — Telephone Encounter (Signed)
Call to Dr Arcot's office and spoke with Misty Stanley.  Requested copy of latest labs to be faxed to Dr [facility] office and fax number is [phone].  Latest lab results were on 02/09/20 and she will fax a copy.  She reported 3/9 creatinine result was 1.15.  Will advise Dr Katrinka Blazing of latest Creatinine result and inquire if lab order is needed while patient is in [place] next week.

## 2020-03-02 NOTE — Telephone Encounter (Signed)
Spoke with patient. Advised received question back from Dr Katrinka Blazing regarding patient's last Creatinine result since patient was asking if a nephrologist referral was needed as suggested by his PCP.  Asked name of PCP and will call and get the latest labs faxed to Dr Katrinka Blazing.  His PCP is Dr Lenward Chancellor in [place] and phone number is [phone].  Patient said he will be in [place] on Tuesday and Wednesday of next week if Dr Katrinka Blazing wants to order some lab work.  Advised will call Dr Arcot's office to obtain labs and will call him back regarding if Dr Katrinka Blazing wants to order labs for next week.

## 2020-03-03 ENCOUNTER — Telehealth: Payer: Self-pay | Admitting: Interventional Cardiology

## 2020-03-03 DIAGNOSIS — I25810 Atherosclerosis of coronary artery bypass graft(s) without angina pectoris: Secondary | ICD-10-CM

## 2020-03-03 DIAGNOSIS — I5022 Chronic systolic (congestive) heart failure: Secondary | ICD-10-CM

## 2020-03-03 NOTE — Telephone Encounter (Signed)
Lyn Records, MD  Short, Josephine Igo, RN  Cc: Julio Sicks, RN  Thanks Jacki Cones. [name], Please get a CMET and lipid panel next week when he is in town.     Left message for pt to call back and schedule fasting labs.

## 2020-03-07 NOTE — Telephone Encounter (Signed)
Pt called back Friday and scheduled labs for this Wednesday the 7th.

## 2020-03-09 ENCOUNTER — Other Ambulatory Visit: Payer: Medicaid Other | Admitting: *Deleted

## 2020-03-09 ENCOUNTER — Other Ambulatory Visit: Payer: Self-pay

## 2020-03-09 DIAGNOSIS — I25810 Atherosclerosis of coronary artery bypass graft(s) without angina pectoris: Secondary | ICD-10-CM | POA: Diagnosis not present

## 2020-03-09 DIAGNOSIS — I5022 Chronic systolic (congestive) heart failure: Secondary | ICD-10-CM

## 2020-03-09 LAB — BASIC METABOLIC PANEL
BUN/Creatinine Ratio: 18 (ref 10–24)
BUN: 24 mg/dL (ref 8–27)
CO2: 25 mmol/L (ref 20–29)
Calcium: 8.9 mg/dL (ref 8.6–10.2)
Chloride: 104 mmol/L (ref 96–106)
Creatinine, Ser: 1.3 mg/dL — ABNORMAL HIGH (ref 0.76–1.27)
GFR calc Af Amer: 68 mL/min/{1.73_m2} (ref >59)
GFR calc non Af Amer: 59 mL/min/{1.73_m2} — ABNORMAL LOW (ref >59)
Glucose: 225 mg/dL — ABNORMAL HIGH (ref 65–99)
Potassium: 3.9 mmol/L (ref 3.5–5.2)
Sodium: 140 mmol/L (ref 134–144)

## 2020-03-09 LAB — LIPID PANEL
Chol/HDL Ratio: 2.5 ratio (ref 0.0–5.0)
Cholesterol, Total: 154 mg/dL (ref 100–199)
HDL: 62 mg/dL (ref >39)
LDL Chol Calc (NIH): 74 mg/dL (ref 0–99)
Triglycerides: 97 mg/dL (ref 0–149)
VLDL Cholesterol Cal: 18 mg/dL (ref 5–40)

## 2020-03-09 LAB — HEPATIC FUNCTION PANEL
ALT: 11 IU/L (ref 0–44)
AST: 14 IU/L (ref 0–40)
Albumin: 3.4 g/dL — ABNORMAL LOW (ref 3.8–4.8)
Alkaline Phosphatase: 111 IU/L (ref 39–117)
Bilirubin Total: 0.3 mg/dL (ref 0.0–1.2)
Bilirubin, Direct: 0.1 mg/dL (ref 0.00–0.40)
Total Protein: 5.6 g/dL — ABNORMAL LOW (ref 6.0–8.5)

## 2020-03-11 NOTE — Telephone Encounter (Signed)
Labs ordered by Dr Katrinka Blazing and completed on 03/09/2020.

## 2020-03-16 DIAGNOSIS — Z23 Encounter for immunization: Secondary | ICD-10-CM | POA: Diagnosis not present

## 2020-04-04 ENCOUNTER — Ambulatory Visit (INDEPENDENT_AMBULATORY_CARE_PROVIDER_SITE_OTHER): Payer: Medicaid Other

## 2020-04-04 DIAGNOSIS — I5022 Chronic systolic (congestive) heart failure: Secondary | ICD-10-CM

## 2020-04-04 DIAGNOSIS — Z95810 Presence of automatic (implantable) cardiac defibrillator: Secondary | ICD-10-CM | POA: Diagnosis not present

## 2020-04-05 NOTE — Progress Notes (Signed)
EPIC Encounter for ICM Monitoring  Patient Name: Travis Tran is a 64 y.o. male Date: 04/05/2020 Primary Care Physican: System, Pcp Not In Primary Cardiologist:Smith Electrophysiologist: [place] 5/4/2021Weight: 144lbs  Spoke with patient and reports feeling well at this time.  Denies fluid symptoms.    Optivolthoracic impedancenormal.   Prescribed:Furosemide 40 mg1 tablet daily.   Labs:       03/09/2020 Creatinine 1.30, BUN 24, Potassium 3.9, Sodium 140, GFR 59-68 12/24/2019 Creatinine 1.70, BUN 37, Potassium 4.2, Sodium 139, GFR 43 Care Everywhere 10/15/2019 Creatinine 1.38, BUN 27, Potassium 4.0, Sodium 137, GFR 55-63, Glucose 356, A1C 9.8% A complete set of results can be found in Results Review.  Recommendations:  No changes and encouraged to call if experiencing any fluid symptoms.  Follow-up plan: ICM clinic phone appointment on6/06/2020.91 day device clinic remote transmission5/17/2021. Office visit with Dr Katrinka Blazing on 04/12/2020.  Copy of ICM check sent to [facility].  3 month ICM trend: 04/04/2020    1 Year ICM trend:       Karie Soda, RN 04/05/2020 4:36 PM

## 2020-04-08 ENCOUNTER — Other Ambulatory Visit (HOSPITAL_COMMUNITY): Payer: Self-pay

## 2020-04-08 MED ORDER — CARVEDILOL 6.25 MG PO TABS: 6.2500 mg | ORAL_TABLET | Freq: Two times a day (BID) | ORAL | 0 refills | Status: DC
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
## 2020-04-12 ENCOUNTER — Ambulatory Visit: Payer: Medicaid Other | Admitting: Interventional Cardiology

## 2020-04-12 ENCOUNTER — Other Ambulatory Visit: Payer: Self-pay

## 2020-04-12 ENCOUNTER — Encounter: Payer: Self-pay | Admitting: Interventional Cardiology

## 2020-04-12 VITALS — BP 118/68 | HR 81 | Ht 66.0 in | Wt 150.0 lb

## 2020-04-12 DIAGNOSIS — E1152 Type 2 diabetes mellitus with diabetic peripheral angiopathy with gangrene: Secondary | ICD-10-CM | POA: Diagnosis not present

## 2020-04-12 DIAGNOSIS — Q240 Dextrocardia: Secondary | ICD-10-CM

## 2020-04-12 DIAGNOSIS — I25810 Atherosclerosis of coronary artery bypass graft(s) without angina pectoris: Secondary | ICD-10-CM | POA: Diagnosis not present

## 2020-04-12 DIAGNOSIS — Z7189 Other specified counseling: Secondary | ICD-10-CM

## 2020-04-12 DIAGNOSIS — I5022 Chronic systolic (congestive) heart failure: Secondary | ICD-10-CM

## 2020-04-12 DIAGNOSIS — Z95810 Presence of automatic (implantable) cardiac defibrillator: Secondary | ICD-10-CM | POA: Diagnosis not present

## 2020-04-12 DIAGNOSIS — I63232 Cerebral infarction due to unspecified occlusion or stenosis of left carotid arteries: Secondary | ICD-10-CM

## 2020-04-12 DIAGNOSIS — I480 Paroxysmal atrial fibrillation: Secondary | ICD-10-CM | POA: Diagnosis not present

## 2020-04-12 DIAGNOSIS — I10 Essential (primary) hypertension: Secondary | ICD-10-CM | POA: Diagnosis not present

## 2020-04-12 NOTE — Patient Instructions (Signed)

## 2020-04-14 DIAGNOSIS — I2510 Atherosclerotic heart disease of native coronary artery without angina pectoris: Secondary | ICD-10-CM | POA: Diagnosis not present

## 2020-04-14 DIAGNOSIS — N1831 Chronic kidney disease, stage 3a: Secondary | ICD-10-CM | POA: Diagnosis not present

## 2020-04-14 DIAGNOSIS — I501 Left ventricular failure: Secondary | ICD-10-CM | POA: Diagnosis not present

## 2020-04-14 DIAGNOSIS — Q240 Dextrocardia: Secondary | ICD-10-CM | POA: Diagnosis not present

## 2020-04-14 DIAGNOSIS — Z95810 Presence of automatic (implantable) cardiac defibrillator: Secondary | ICD-10-CM | POA: Diagnosis not present

## 2020-04-14 DIAGNOSIS — I10 Essential (primary) hypertension: Secondary | ICD-10-CM | POA: Diagnosis not present

## 2020-04-14 DIAGNOSIS — L409 Psoriasis, unspecified: Secondary | ICD-10-CM | POA: Diagnosis not present

## 2020-04-14 DIAGNOSIS — M0560 Rheumatoid arthritis of unspecified site with involvement of other organs and systems: Secondary | ICD-10-CM | POA: Diagnosis not present

## 2020-04-14 DIAGNOSIS — E119 Type 2 diabetes mellitus without complications: Secondary | ICD-10-CM | POA: Diagnosis not present

## 2020-04-14 DIAGNOSIS — R801 Persistent proteinuria, unspecified: Secondary | ICD-10-CM | POA: Diagnosis not present

## 2020-04-18 ENCOUNTER — Ambulatory Visit (INDEPENDENT_AMBULATORY_CARE_PROVIDER_SITE_OTHER): Payer: Medicaid Other | Admitting: *Deleted

## 2020-04-18 DIAGNOSIS — I255 Ischemic cardiomyopathy: Secondary | ICD-10-CM | POA: Diagnosis not present

## 2020-04-18 DIAGNOSIS — N183 Chronic kidney disease, stage 3 unspecified: Secondary | ICD-10-CM | POA: Diagnosis not present

## 2020-04-18 LAB — CUP PACEART REMOTE DEVICE CHECK
Battery Remaining Longevity: 83 mo
Battery Voltage: 2.99 V
Brady Statistic RV Percent Paced: 0 %
Date Time Interrogation Session: 20210517061706
HighPow Impedance: 59 Ohm
Implantable Lead Implant Date: 20170808
Implantable Lead Location: 753860
Implantable Pulse Generator Implant Date: 20170808
Lead Channel Impedance Value: 247 Ohm
Lead Channel Impedance Value: 342 Ohm
Lead Channel Pacing Threshold Amplitude: 0.75 V
Lead Channel Pacing Threshold Pulse Width: 0.4 ms
Lead Channel Sensing Intrinsic Amplitude: 11.875 mV
Lead Channel Sensing Intrinsic Amplitude: 11.875 mV
Lead Channel Setting Pacing Amplitude: 2.5 V
Lead Channel Setting Pacing Pulse Width: 0.4 ms
Lead Channel Setting Sensing Sensitivity: 0.3 mV

## 2020-04-19 NOTE — Progress Notes (Signed)
Remote ICD transmission.   

## 2020-05-09 ENCOUNTER — Ambulatory Visit (INDEPENDENT_AMBULATORY_CARE_PROVIDER_SITE_OTHER): Payer: Medicaid Other

## 2020-05-09 DIAGNOSIS — I5022 Chronic systolic (congestive) heart failure: Secondary | ICD-10-CM | POA: Diagnosis not present

## 2020-05-09 DIAGNOSIS — Z95810 Presence of automatic (implantable) cardiac defibrillator: Secondary | ICD-10-CM | POA: Diagnosis not present

## 2020-05-11 NOTE — Progress Notes (Signed)
EPIC Encounter for ICM Monitoring  Patient Name: Travis Tran is a 64 y.o. male Date: 05/11/2020 Primary Care Physican: System, Pcp Not In Primary Cardiologist:Smith Electrophysiologist: Camnitz 6/9/2021Weight: 144-146lbs  Spoke with patient and reports feeling well at this time.  Denies fluid symptoms.    Optivolthoracic impedance trending slightly below baselinenormal.   Prescribed:Furosemide 40 mg1 tablet daily.   Labs:       03/09/2020 Creatinine 1.30, BUN 24, Potassium 3.9, Sodium 140, GFR 59-68 12/24/2019 Creatinine 1.70, BUN 37, Potassium 4.2, Sodium 139, GFR 43 Care Everywhere 10/15/2019 Creatinine 1.38, BUN 27, Potassium 4.0, Sodium 137, GFR 55-63, Glucose 356, A1C 9.8% A complete set of results can be found in Results Review.  Recommendations:  Recommendation to limit salt intake to 2000 mg daily and fluid intake to 64 oz daily.  Encouraged to call if experiencing any fluid symptoms.   Follow-up plan: ICM clinic phone appointment on7/11/2020.91 day device clinic remote transmission 07/18/2020.   Copy of ICM check sent to [facility].  3 month ICM trend: 05/09/2020    1 Year ICM trend:       Karie Soda, RN 05/11/2020 12:54 PM

## 2020-05-20 DIAGNOSIS — Z1389 Encounter for screening for other disorder: Secondary | ICD-10-CM | POA: Diagnosis not present

## 2020-05-20 DIAGNOSIS — Z6824 Body mass index (BMI) 24.0-24.9, adult: Secondary | ICD-10-CM | POA: Diagnosis not present

## 2020-05-20 DIAGNOSIS — Z794 Long term (current) use of insulin: Secondary | ICD-10-CM | POA: Diagnosis not present

## 2020-05-20 DIAGNOSIS — N183 Chronic kidney disease, stage 3 unspecified: Secondary | ICD-10-CM | POA: Diagnosis not present

## 2020-05-20 DIAGNOSIS — E1065 Type 1 diabetes mellitus with hyperglycemia: Secondary | ICD-10-CM | POA: Diagnosis not present

## 2020-05-20 DIAGNOSIS — E1140 Type 2 diabetes mellitus with diabetic neuropathy, unspecified: Secondary | ICD-10-CM | POA: Diagnosis not present

## 2020-05-24 DIAGNOSIS — E1121 Type 2 diabetes mellitus with diabetic nephropathy: Secondary | ICD-10-CM | POA: Diagnosis not present

## 2020-05-24 DIAGNOSIS — N183 Chronic kidney disease, stage 3 unspecified: Secondary | ICD-10-CM | POA: Diagnosis not present

## 2020-05-24 DIAGNOSIS — I10 Essential (primary) hypertension: Secondary | ICD-10-CM | POA: Diagnosis not present

## 2020-05-24 DIAGNOSIS — Z79899 Other long term (current) drug therapy: Secondary | ICD-10-CM | POA: Diagnosis not present

## 2020-05-24 DIAGNOSIS — E785 Hyperlipidemia, unspecified: Secondary | ICD-10-CM | POA: Diagnosis not present

## 2020-05-26 DIAGNOSIS — I10 Essential (primary) hypertension: Secondary | ICD-10-CM | POA: Diagnosis not present

## 2020-05-26 DIAGNOSIS — E1022 Type 1 diabetes mellitus with diabetic chronic kidney disease: Secondary | ICD-10-CM | POA: Diagnosis not present

## 2020-05-26 DIAGNOSIS — E782 Mixed hyperlipidemia: Secondary | ICD-10-CM | POA: Diagnosis not present

## 2020-05-26 DIAGNOSIS — N183 Chronic kidney disease, stage 3 unspecified: Secondary | ICD-10-CM | POA: Diagnosis not present

## 2020-06-02 DIAGNOSIS — I10 Essential (primary) hypertension: Secondary | ICD-10-CM | POA: Diagnosis not present

## 2020-06-02 DIAGNOSIS — R801 Persistent proteinuria, unspecified: Secondary | ICD-10-CM | POA: Diagnosis not present

## 2020-06-02 DIAGNOSIS — I501 Left ventricular failure: Secondary | ICD-10-CM | POA: Diagnosis not present

## 2020-06-02 DIAGNOSIS — Q240 Dextrocardia: Secondary | ICD-10-CM | POA: Diagnosis not present

## 2020-06-02 DIAGNOSIS — M0560 Rheumatoid arthritis of unspecified site with involvement of other organs and systems: Secondary | ICD-10-CM | POA: Diagnosis not present

## 2020-06-02 DIAGNOSIS — I6529 Occlusion and stenosis of unspecified carotid artery: Secondary | ICD-10-CM | POA: Diagnosis not present

## 2020-06-02 DIAGNOSIS — E119 Type 2 diabetes mellitus without complications: Secondary | ICD-10-CM | POA: Diagnosis not present

## 2020-06-02 DIAGNOSIS — N1831 Chronic kidney disease, stage 3a: Secondary | ICD-10-CM | POA: Diagnosis not present

## 2020-06-03 DIAGNOSIS — Z794 Long term (current) use of insulin: Secondary | ICD-10-CM | POA: Diagnosis not present

## 2020-06-03 DIAGNOSIS — Z6824 Body mass index (BMI) 24.0-24.9, adult: Secondary | ICD-10-CM | POA: Diagnosis not present

## 2020-06-03 DIAGNOSIS — N183 Chronic kidney disease, stage 3 unspecified: Secondary | ICD-10-CM | POA: Diagnosis not present

## 2020-06-03 DIAGNOSIS — E1140 Type 2 diabetes mellitus with diabetic neuropathy, unspecified: Secondary | ICD-10-CM | POA: Diagnosis not present

## 2020-06-03 DIAGNOSIS — Z1389 Encounter for screening for other disorder: Secondary | ICD-10-CM | POA: Diagnosis not present

## 2020-06-03 DIAGNOSIS — E1065 Type 1 diabetes mellitus with hyperglycemia: Secondary | ICD-10-CM | POA: Diagnosis not present

## 2020-06-13 ENCOUNTER — Ambulatory Visit (INDEPENDENT_AMBULATORY_CARE_PROVIDER_SITE_OTHER): Payer: Medicaid Other

## 2020-06-13 DIAGNOSIS — Z95810 Presence of automatic (implantable) cardiac defibrillator: Secondary | ICD-10-CM | POA: Diagnosis not present

## 2020-06-13 DIAGNOSIS — I5022 Chronic systolic (congestive) heart failure: Secondary | ICD-10-CM | POA: Diagnosis not present

## 2020-06-17 NOTE — Progress Notes (Signed)
EPIC Encounter for ICM Monitoring  Patient Name: Travis Tran is a 64 y.o. male Date: 06/17/2020 Primary Care Physican: System, Pcp Not In Primary Cardiologist:Smith Electrophysiologist: Camnitz 6/9/2021Weight: 144-146lbs  Transmission reviewed.   Optivolthoracic impedance trending slightly below baselinenormal.   Prescribed:Furosemide 40 mg1 tablet daily.   Labs: 03/09/2020 Creatinine 1.30, BUN 24, Potassium 3.9, Sodium 140, GFR 59-68 12/24/2019 Creatinine 1.70, BUN 37, Potassium 4.2, Sodium 139, GFR 43 Care Everywhere 10/15/2019 Creatinine 1.38, BUN 27, Potassium 4.0, Sodium 137, GFR 55-63, Glucose 356, A1C 9.8% A complete set of results can be found in Results Review.  Recommendations:No changes.   Follow-up plan: ICM clinic phone appointment on8/17/2021.91 day device clinic remote transmission 07/18/2020.   EP/Cardiology Office Visits: Recalls for 07/20/2020 with Dr. Elberta Fortis and 10/09/2020 with Dr Katrinka Blazing.    Copy of ICM check sent to Dr. Elberta Fortis.   3 month ICM trend: 06/13/2020    1 Year ICM trend:       Karie Soda, RN 06/17/2020 9:47 AM

## 2020-06-23 DIAGNOSIS — Z79899 Other long term (current) drug therapy: Secondary | ICD-10-CM | POA: Diagnosis not present

## 2020-06-23 DIAGNOSIS — L409 Psoriasis, unspecified: Secondary | ICD-10-CM | POA: Diagnosis not present

## 2020-06-23 DIAGNOSIS — L4050 Arthropathic psoriasis, unspecified: Secondary | ICD-10-CM | POA: Diagnosis not present

## 2020-07-09 ENCOUNTER — Other Ambulatory Visit (HOSPITAL_COMMUNITY): Payer: Self-pay | Admitting: Internal Medicine

## 2020-07-18 ENCOUNTER — Ambulatory Visit (INDEPENDENT_AMBULATORY_CARE_PROVIDER_SITE_OTHER): Payer: Medicaid Other | Admitting: *Deleted

## 2020-07-18 DIAGNOSIS — Z95810 Presence of automatic (implantable) cardiac defibrillator: Secondary | ICD-10-CM | POA: Diagnosis not present

## 2020-07-18 LAB — CUP PACEART REMOTE DEVICE CHECK
Battery Remaining Longevity: 84 mo
Battery Voltage: 2.99 V
Brady Statistic RV Percent Paced: 0.01 %
Date Time Interrogation Session: 20210816063625
HighPow Impedance: 68 Ohm
Implantable Lead Implant Date: 20170808
Implantable Lead Location: 753860
Implantable Pulse Generator Implant Date: 20170808
Lead Channel Impedance Value: 285 Ohm
Lead Channel Impedance Value: 342 Ohm
Lead Channel Pacing Threshold Amplitude: 0.625 V
Lead Channel Pacing Threshold Pulse Width: 0.4 ms
Lead Channel Sensing Intrinsic Amplitude: 14.875 mV
Lead Channel Sensing Intrinsic Amplitude: 14.875 mV
Lead Channel Setting Pacing Amplitude: 2.5 V
Lead Channel Setting Pacing Pulse Width: 0.4 ms
Lead Channel Setting Sensing Sensitivity: 0.3 mV

## 2020-07-19 ENCOUNTER — Ambulatory Visit (INDEPENDENT_AMBULATORY_CARE_PROVIDER_SITE_OTHER): Payer: Medicaid Other

## 2020-07-19 DIAGNOSIS — N183 Chronic kidney disease, stage 3 unspecified: Secondary | ICD-10-CM | POA: Diagnosis not present

## 2020-07-19 DIAGNOSIS — Z95810 Presence of automatic (implantable) cardiac defibrillator: Secondary | ICD-10-CM

## 2020-07-19 DIAGNOSIS — I5022 Chronic systolic (congestive) heart failure: Secondary | ICD-10-CM | POA: Diagnosis not present

## 2020-07-19 DIAGNOSIS — E559 Vitamin D deficiency, unspecified: Secondary | ICD-10-CM | POA: Diagnosis not present

## 2020-07-19 DIAGNOSIS — Z79899 Other long term (current) drug therapy: Secondary | ICD-10-CM | POA: Diagnosis not present

## 2020-07-19 NOTE — Progress Notes (Signed)
Remote ICD transmission.   

## 2020-07-22 NOTE — Progress Notes (Signed)
EPIC Encounter for ICM Monitoring  Patient Name: Travis Tran is a 64 y.o. male Date: 07/22/2020 Primary Care Physican: System, Pcp Not In Primary Cardiologist:Smith Electrophysiologist: Camnitz 8/20/2021Weight: 144-146lbs  Spoke with patient and reports feeling well at this time.  Denies fluid symptoms.    Optivolthoracic impedancenormal.   Prescribed:Furosemide 40 mg1 tablet daily.   Labs: 03/09/2020 Creatinine 1.30, BUN 24, Potassium 3.9, Sodium 140, GFR 59-68 12/24/2019 Creatinine 1.70, BUN 37, Potassium 4.2, Sodium 139, GFR 43 Care Everywhere 10/15/2019 Creatinine 1.38, BUN 27, Potassium 4.0, Sodium 137, GFR 55-63, Glucose 356, A1C 9.8% A complete set of results can be found in Results Review.  Recommendations:No changes and encouraged to call if experiencing any fluid symptoms.  Follow-up plan: ICM clinic phone appointment on9/20/2021.91 day device clinic remote transmission11/15/2021.   EP/Cardiology Office Visits:  08/30/2020 with Dr. Elberta Fortis and 10/26/2020 with Dr Katrinka Blazing.    Copy of ICM check sent to Dr. Elberta Fortis.    3 month ICM trend: 07/18/2020    1 Year ICM trend:       Karie Soda, RN 07/22/2020 4:09 PM

## 2020-08-22 ENCOUNTER — Ambulatory Visit (INDEPENDENT_AMBULATORY_CARE_PROVIDER_SITE_OTHER): Payer: Medicaid Other

## 2020-08-22 DIAGNOSIS — Z95810 Presence of automatic (implantable) cardiac defibrillator: Secondary | ICD-10-CM

## 2020-08-22 DIAGNOSIS — I5022 Chronic systolic (congestive) heart failure: Secondary | ICD-10-CM

## 2020-08-24 NOTE — Progress Notes (Signed)
EPIC Encounter for ICM Monitoring  Patient Name: Travis Tran is a 64 y.o. male Date: 08/24/2020 Primary Care Physican: Pcp, No Primary Cardiologist:Smith Electrophysiologist: Camnitz 8/20/2021Weight: 144-146lbs  Spoke with patient and reports feeling well at this time.  Denies fluid symptoms.    Optivolthoracic impedancenormal.   Prescribed:Furosemide 40 mg1 tablet daily.   Labs: 03/09/2020 Creatinine 1.30, BUN 24, Potassium 3.9, Sodium 140, GFR 59-68 12/24/2019 Creatinine 1.70, BUN 37, Potassium 4.2, Sodium 139, GFR 43 Care Everywhere 10/15/2019 Creatinine 1.38, BUN 27, Potassium 4.0, Sodium 137, GFR 55-63, Glucose 356, A1C 9.8% A complete set of results can be found in Results Review.  Recommendations:No changes and encouraged to call if experiencing any fluid symptoms.  Follow-up plan: ICM clinic phone appointment on10/25/2021.91 day device clinic remote transmission11/15/2021.   EP/Cardiology Office Visits: 08/30/2020 with Dr.Camnitz and 10/26/2020 with Dr Katrinka Blazing.   Copy of ICM check sent to [facility].    3 month ICM trend: 08/24/2020    1 Year ICM trend:       Karie Soda, RN 08/24/2020 3:21 PM

## 2020-08-30 ENCOUNTER — Ambulatory Visit (INDEPENDENT_AMBULATORY_CARE_PROVIDER_SITE_OTHER): Payer: Medicaid Other | Admitting: Cardiology

## 2020-08-30 ENCOUNTER — Other Ambulatory Visit: Payer: Self-pay

## 2020-08-30 ENCOUNTER — Encounter: Payer: Self-pay | Admitting: Cardiology

## 2020-08-30 VITALS — BP 77/50 | HR 90 | Ht 66.0 in | Wt 150.0 lb

## 2020-08-30 DIAGNOSIS — I255 Ischemic cardiomyopathy: Secondary | ICD-10-CM | POA: Diagnosis not present

## 2020-08-30 NOTE — Patient Instructions (Signed)
Medication Instructions:  Your physician recommends that you continue on your current medications as directed. Please refer to the Current Medication list given to you today.  *If you need a refill on your cardiac medications before your next appointment, please call your pharmacy*   Lab Work: None ordered   Testing/Procedures: None ordered   Follow-Up: At [facility], you and your health needs are our priority.  As part of our continuing mission to provide you with exceptional heart care, we have created designated Provider Care Teams.  These Care Teams include your primary Cardiologist (physician) and Advanced Practice Providers (APPs -  Physician Assistants and Nurse Practitioners) who all work together to provide you with the care you need, when you need it.  Remote monitoring is used to monitor your Pacemaker or ICD from home. This monitoring reduces the number of office visits required to check your device to one time per year. It allows [place] to keep an eye on the functioning of your device to ensure it is working properly. You are scheduled for a device check from home on 09/26/2020 - ICM clinic, and 10/17/20. You may send your transmission at any time that day. If you have a wireless device, the transmission will be sent automatically. After your physician reviews your transmission, you will receive a postcard with your next transmission date.  Your next appointment:   1 year(s)  The format for your next appointment:   In Person  Provider:   Loman Brooklyn, MD   Thank you for choosing [facility]!!   Dory Horn, RN [phone]
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
## 2020-09-08 DIAGNOSIS — N1831 Chronic kidney disease, stage 3a: Secondary | ICD-10-CM | POA: Diagnosis not present

## 2020-09-13 DIAGNOSIS — Z794 Long term (current) use of insulin: Secondary | ICD-10-CM | POA: Diagnosis not present

## 2020-09-13 DIAGNOSIS — Z6824 Body mass index (BMI) 24.0-24.9, adult: Secondary | ICD-10-CM | POA: Diagnosis not present

## 2020-09-13 DIAGNOSIS — E1065 Type 1 diabetes mellitus with hyperglycemia: Secondary | ICD-10-CM | POA: Diagnosis not present

## 2020-09-13 DIAGNOSIS — N183 Chronic kidney disease, stage 3 unspecified: Secondary | ICD-10-CM | POA: Diagnosis not present

## 2020-09-15 DIAGNOSIS — E119 Type 2 diabetes mellitus without complications: Secondary | ICD-10-CM | POA: Diagnosis not present

## 2020-09-15 DIAGNOSIS — R801 Persistent proteinuria, unspecified: Secondary | ICD-10-CM | POA: Diagnosis not present

## 2020-09-15 DIAGNOSIS — I10 Essential (primary) hypertension: Secondary | ICD-10-CM | POA: Diagnosis not present

## 2020-09-15 DIAGNOSIS — I501 Left ventricular failure: Secondary | ICD-10-CM | POA: Diagnosis not present

## 2020-09-15 DIAGNOSIS — N1831 Chronic kidney disease, stage 3a: Secondary | ICD-10-CM | POA: Diagnosis not present

## 2020-09-26 ENCOUNTER — Ambulatory Visit: Payer: Medicaid Other

## 2020-09-26 DIAGNOSIS — Z95810 Presence of automatic (implantable) cardiac defibrillator: Secondary | ICD-10-CM

## 2020-09-26 DIAGNOSIS — I5022 Chronic systolic (congestive) heart failure: Secondary | ICD-10-CM

## 2020-09-30 NOTE — Progress Notes (Signed)
EPIC Encounter for ICM Monitoring  Patient Name: Travis Tran is a 64 y.o. male Date: 09/30/2020 Primary Care Physican: Osie Cheeks, MD Primary Cardiologist:Smith Electrophysiologist: Elberta Fortis 8/20/2021Weight: [phone]  Transmission reviewed.  Optivolthoracic impedancenormal.   Prescribed:Furosemide 40 mg1 tablet daily.   Labs: 03/09/2020 Creatinine 1.30, BUN 24, Potassium 3.9, Sodium 140, GFR 59-68 12/24/2019 Creatinine 1.70, BUN 37, Potassium 4.2, Sodium 139, GFR 43 Care Everywhere 10/15/2019 Creatinine 1.38, BUN 27, Potassium 4.0, Sodium 137, GFR 55-63, Glucose 356, A1C 9.8% A complete set of results can be found in Results Review.  Recommendations:No changes.  Follow-up plan: ICM clinic phone appointment on11/30/2021.91 day device clinic remote transmission11/15/2021.   EP/Cardiology Office Visits: 10/26/2020 with Dr Katrinka Blazing.   Copy of ICM check sent to [facility].   3 month ICM trend: 09/30/2020    1 Year ICM trend:       Karie Soda, RN 09/30/2020 11:24 AM

## 2020-10-07 ENCOUNTER — Other Ambulatory Visit: Payer: Self-pay | Admitting: Interventional Cardiology

## 2020-10-17 ENCOUNTER — Ambulatory Visit (INDEPENDENT_AMBULATORY_CARE_PROVIDER_SITE_OTHER): Payer: Medicaid Other

## 2020-10-17 DIAGNOSIS — I255 Ischemic cardiomyopathy: Secondary | ICD-10-CM | POA: Diagnosis not present

## 2020-10-18 LAB — CUP PACEART REMOTE DEVICE CHECK
Battery Remaining Longevity: 78 mo
Battery Voltage: 2.98 V
Brady Statistic RV Percent Paced: 0 %
Date Time Interrogation Session: 20211116093529
HighPow Impedance: 59 Ohm
Implantable Lead Implant Date: 20170808
Implantable Lead Location: 753860
Implantable Pulse Generator Implant Date: 20170808
Lead Channel Impedance Value: 247 Ohm
Lead Channel Impedance Value: 342 Ohm
Lead Channel Pacing Threshold Amplitude: 0.75 V
Lead Channel Pacing Threshold Pulse Width: 0.4 ms
Lead Channel Sensing Intrinsic Amplitude: 15.375 mV
Lead Channel Sensing Intrinsic Amplitude: 15.375 mV
Lead Channel Setting Pacing Amplitude: 2.5 V
Lead Channel Setting Pacing Pulse Width: 0.4 ms
Lead Channel Setting Sensing Sensitivity: 0.3 mV

## 2020-10-19 NOTE — Progress Notes (Signed)
Remote ICD transmission.   

## 2020-10-24 DIAGNOSIS — E119 Type 2 diabetes mellitus without complications: Secondary | ICD-10-CM | POA: Diagnosis not present

## 2020-10-24 DIAGNOSIS — E785 Hyperlipidemia, unspecified: Secondary | ICD-10-CM | POA: Diagnosis not present

## 2020-10-24 DIAGNOSIS — Z79899 Other long term (current) drug therapy: Secondary | ICD-10-CM | POA: Diagnosis not present

## 2020-10-24 DIAGNOSIS — I2510 Atherosclerotic heart disease of native coronary artery without angina pectoris: Secondary | ICD-10-CM | POA: Diagnosis not present

## 2020-10-26 ENCOUNTER — Ambulatory Visit: Payer: Medicaid Other | Admitting: Interventional Cardiology

## 2020-11-01 ENCOUNTER — Ambulatory Visit (INDEPENDENT_AMBULATORY_CARE_PROVIDER_SITE_OTHER): Payer: Medicaid Other

## 2020-11-01 DIAGNOSIS — Z95810 Presence of automatic (implantable) cardiac defibrillator: Secondary | ICD-10-CM

## 2020-11-01 DIAGNOSIS — I5022 Chronic systolic (congestive) heart failure: Secondary | ICD-10-CM

## 2020-11-02 DIAGNOSIS — I10 Essential (primary) hypertension: Secondary | ICD-10-CM | POA: Diagnosis not present

## 2020-11-02 DIAGNOSIS — Z23 Encounter for immunization: Secondary | ICD-10-CM | POA: Diagnosis not present

## 2020-11-02 DIAGNOSIS — E1021 Type 1 diabetes mellitus with diabetic nephropathy: Secondary | ICD-10-CM | POA: Diagnosis not present

## 2020-11-02 DIAGNOSIS — E785 Hyperlipidemia, unspecified: Secondary | ICD-10-CM | POA: Diagnosis not present

## 2020-11-02 DIAGNOSIS — I2510 Atherosclerotic heart disease of native coronary artery without angina pectoris: Secondary | ICD-10-CM | POA: Diagnosis not present

## 2020-11-04 NOTE — Progress Notes (Signed)
EPIC Encounter for ICM Monitoring  Patient Name: Travis Tran is a 64 y.o. male Date: 11/04/2020 Primary Care Physican: Osie Cheeks, MD Primary Cardiologist:Smith Electrophysiologist: Elberta Fortis 12/3/2021Weight: 148lbs  Spoke with patient and reports feeling well at this time.  Denies fluid symptoms.    Optivolthoracic impedancenormal.   Prescribed:Furosemide 40 mg1 tablet daily.   Labs: 03/09/2020 Creatinine 1.30, BUN 24, Potassium 3.9, Sodium 140, GFR 59-68 12/24/2019 Creatinine 1.70, BUN 37, Potassium 4.2, Sodium 139, GFR 43 Care Everywhere 10/15/2019 Creatinine 1.38, BUN 27, Potassium 4.0, Sodium 137, GFR 55-63, Glucose 356, A1C 9.8% A complete set of results can be found in Results Review.  Recommendations:No changes.  Follow-up plan: ICM clinic phone appointment on1/01/2021.91 day device clinic remote transmission12/14/2022.   EP/Cardiology Office Visits:02/06/2021 with Dr Katrinka Blazing.   Copy of ICM check sent to [facility].    3 month ICM trend: 11/01/2020    1 Year ICM trend:       Karie Soda, RN 11/04/2020 4:19 PM

## 2020-11-14 DIAGNOSIS — E1021 Type 1 diabetes mellitus with diabetic nephropathy: Secondary | ICD-10-CM | POA: Diagnosis not present

## 2020-11-14 DIAGNOSIS — J0140 Acute pansinusitis, unspecified: Secondary | ICD-10-CM | POA: Diagnosis not present

## 2020-11-14 DIAGNOSIS — I10 Essential (primary) hypertension: Secondary | ICD-10-CM | POA: Diagnosis not present

## 2020-12-05 ENCOUNTER — Ambulatory Visit (INDEPENDENT_AMBULATORY_CARE_PROVIDER_SITE_OTHER): Payer: Medicaid Other

## 2020-12-05 DIAGNOSIS — I5022 Chronic systolic (congestive) heart failure: Secondary | ICD-10-CM

## 2020-12-05 DIAGNOSIS — Z95810 Presence of automatic (implantable) cardiac defibrillator: Secondary | ICD-10-CM

## 2020-12-07 NOTE — Progress Notes (Signed)
EPIC Encounter for ICM Monitoring  Patient Name: Travis Tran is a 65 y.o. male Date: 12/07/2020 Primary Care Physican: Osie Cheeks, MD Primary Cardiologist:Smith Electrophysiologist: Elberta Fortis 12/07/2020 Weight: 148lbs  Spoke with patient and reports feeling well at this time.  Denies fluid symptoms.    Optivolthoracic impedancenormal.   Prescribed:Furosemide 40 mg1 tablet daily.   Labs: 03/09/2020 Creatinine 1.30, BUN 24, Potassium 3.9, Sodium 140, GFR 59-68 12/24/2019 Creatinine 1.70, BUN 37, Potassium 4.2, Sodium 139, GFR 43 Care Everywhere A complete set of results can be found in Results Review.  Recommendations:No changes.  Follow-up plan: ICM clinic phone appointment on2/07/2021.91 day device clinic remote transmission12/14/2022.   EP/Cardiology Office Visits:02/06/2021 with Dr Katrinka Blazing.   Copy of ICM check sent to [facility].   3 month ICM trend: 12/05/2020.    1 Year ICM trend:       Karie Soda, RN 12/07/2020 12:37 PM

## 2020-12-20 DIAGNOSIS — Z03818 Encounter for observation for suspected exposure to other biological agents ruled out: Secondary | ICD-10-CM | POA: Diagnosis not present

## 2021-01-10 ENCOUNTER — Ambulatory Visit (INDEPENDENT_AMBULATORY_CARE_PROVIDER_SITE_OTHER): Payer: Medicaid Other

## 2021-01-10 DIAGNOSIS — Z95810 Presence of automatic (implantable) cardiac defibrillator: Secondary | ICD-10-CM

## 2021-01-10 DIAGNOSIS — I5022 Chronic systolic (congestive) heart failure: Secondary | ICD-10-CM

## 2021-01-16 ENCOUNTER — Ambulatory Visit (INDEPENDENT_AMBULATORY_CARE_PROVIDER_SITE_OTHER): Payer: Medicaid Other

## 2021-01-16 DIAGNOSIS — I255 Ischemic cardiomyopathy: Secondary | ICD-10-CM

## 2021-01-16 LAB — CUP PACEART REMOTE DEVICE CHECK
Battery Remaining Longevity: 71 mo
Battery Voltage: 2.98 V
Brady Statistic RV Percent Paced: 0 %
Date Time Interrogation Session: 20220214042404
HighPow Impedance: 54 Ohm
Implantable Lead Implant Date: 20170808
Implantable Lead Location: 753860
Implantable Pulse Generator Implant Date: 20170808
Lead Channel Impedance Value: 285 Ohm
Lead Channel Impedance Value: 342 Ohm
Lead Channel Pacing Threshold Amplitude: 0.625 V
Lead Channel Pacing Threshold Pulse Width: 0.4 ms
Lead Channel Sensing Intrinsic Amplitude: 15.625 mV
Lead Channel Sensing Intrinsic Amplitude: 15.625 mV
Lead Channel Setting Pacing Amplitude: 2.5 V
Lead Channel Setting Pacing Pulse Width: 0.4 ms
Lead Channel Setting Sensing Sensitivity: 0.3 mV

## 2021-01-17 NOTE — Progress Notes (Signed)
EPIC Encounter for ICM Monitoring  Patient Name: Travis Tran is a 65 y.o. male Date: 01/17/2021 Primary Care Physican: Osie Cheeks, MD Primary Cardiologist:Smith Electrophysiologist: Elberta Fortis 12/07/2020 Weight: 148lbs  Transmission reviewed.  Optivolthoracic impedancenormal.   Prescribed:Furosemide 40 mg1 tablet daily.   Labs: 03/09/2020 Creatinine 1.30, BUN 24, Potassium 3.9, Sodium 140, GFR 59-68 12/24/2019 Creatinine 1.70, BUN 37, Potassium 4.2, Sodium 139, GFR 43 Care Everywhere A complete set of results can be found in Results Review.  Recommendations:No changes.  Follow-up plan: ICM clinic phone appointment on3/14/2022.91 day device clinic remote transmission5/16/2022.   EP/Cardiology Office Visits:3/7/2022with Dr Katrinka Blazing.   Copy of ICM check sent to [facility].   3 month ICM trend: 01/11/2021.    1 Year ICM trend:       Karie Soda, RN 01/17/2021 12:27 PM

## 2021-01-23 NOTE — Progress Notes (Signed)
Remote ICD transmission.   

## 2021-01-25 DIAGNOSIS — Z79899 Other long term (current) drug therapy: Secondary | ICD-10-CM | POA: Diagnosis not present

## 2021-01-25 DIAGNOSIS — I10 Essential (primary) hypertension: Secondary | ICD-10-CM | POA: Diagnosis not present

## 2021-01-25 DIAGNOSIS — I509 Heart failure, unspecified: Secondary | ICD-10-CM | POA: Diagnosis not present

## 2021-02-01 DIAGNOSIS — Z23 Encounter for immunization: Secondary | ICD-10-CM | POA: Diagnosis not present

## 2021-02-01 DIAGNOSIS — N183 Chronic kidney disease, stage 3 unspecified: Secondary | ICD-10-CM | POA: Diagnosis not present

## 2021-02-01 DIAGNOSIS — I509 Heart failure, unspecified: Secondary | ICD-10-CM | POA: Diagnosis not present

## 2021-02-01 DIAGNOSIS — E785 Hyperlipidemia, unspecified: Secondary | ICD-10-CM | POA: Diagnosis not present

## 2021-02-01 DIAGNOSIS — E559 Vitamin D deficiency, unspecified: Secondary | ICD-10-CM | POA: Diagnosis not present

## 2021-02-01 DIAGNOSIS — I2510 Atherosclerotic heart disease of native coronary artery without angina pectoris: Secondary | ICD-10-CM | POA: Diagnosis not present

## 2021-02-01 DIAGNOSIS — E1021 Type 1 diabetes mellitus with diabetic nephropathy: Secondary | ICD-10-CM | POA: Diagnosis not present

## 2021-02-01 DIAGNOSIS — I129 Hypertensive chronic kidney disease with stage 1 through stage 4 chronic kidney disease, or unspecified chronic kidney disease: Secondary | ICD-10-CM | POA: Diagnosis not present

## 2021-02-01 DIAGNOSIS — D649 Anemia, unspecified: Secondary | ICD-10-CM | POA: Diagnosis not present
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
## 2021-02-06 ENCOUNTER — Encounter: Payer: Self-pay | Admitting: Interventional Cardiology

## 2021-02-06 ENCOUNTER — Ambulatory Visit (INDEPENDENT_AMBULATORY_CARE_PROVIDER_SITE_OTHER): Payer: Medicaid Other | Admitting: Interventional Cardiology

## 2021-02-06 ENCOUNTER — Other Ambulatory Visit: Payer: Self-pay

## 2021-02-06 VITALS — BP 124/68 | HR 84 | Ht 66.0 in | Wt 151.6 lb

## 2021-02-06 DIAGNOSIS — E1152 Type 2 diabetes mellitus with diabetic peripheral angiopathy with gangrene: Secondary | ICD-10-CM

## 2021-02-06 DIAGNOSIS — I10 Essential (primary) hypertension: Secondary | ICD-10-CM | POA: Diagnosis not present

## 2021-02-06 DIAGNOSIS — I63232 Cerebral infarction due to unspecified occlusion or stenosis of left carotid arteries: Secondary | ICD-10-CM | POA: Diagnosis not present

## 2021-02-06 DIAGNOSIS — Z95810 Presence of automatic (implantable) cardiac defibrillator: Secondary | ICD-10-CM

## 2021-02-06 DIAGNOSIS — I480 Paroxysmal atrial fibrillation: Secondary | ICD-10-CM | POA: Diagnosis not present

## 2021-02-06 DIAGNOSIS — Z7189 Other specified counseling: Secondary | ICD-10-CM

## 2021-02-06 DIAGNOSIS — I25810 Atherosclerosis of coronary artery bypass graft(s) without angina pectoris: Secondary | ICD-10-CM

## 2021-02-06 DIAGNOSIS — I5022 Chronic systolic (congestive) heart failure: Secondary | ICD-10-CM | POA: Diagnosis not present

## 2021-02-06 NOTE — Patient Instructions (Signed)
Medication Instructions:  Your physician recommends that you continue on your current medications as directed. Please refer to the Current Medication list given to you today.  *If you need a refill on your cardiac medications before your next appointment, please call your pharmacy*   Lab Work: None If you have labs (blood work) drawn today and your tests are completely normal, you will receive your results only by: [name] MyChart Message (if you have MyChart) OR . A paper copy in the mail If you have any lab test that is abnormal or we need to change your treatment, we will call you to review the results.   Testing/Procedures: Your physician has requested that you have an echocardiogram. Echocardiography is a painless test that uses sound waves to create images of your heart. It provides your doctor with information about the size and shape of your heart and how well your heart's chambers and valves are working. This procedure takes approximately one hour. There are no restrictions for this procedure.    Follow-Up: At [facility], you and your health needs are our priority.  As part of our continuing mission to provide you with exceptional heart care, we have created designated Provider Care Teams.  These Care Teams include your primary Cardiologist (physician) and Advanced Practice Providers (APPs -  Physician Assistants and Nurse Practitioners) who all work together to provide you with the care you need, when you need it.  We recommend signing up for the patient portal called "MyChart".  Sign up information is provided on this After Visit Summary.  MyChart is used to connect with patients for Virtual Visits (Telemedicine).  Patients are able to view lab/test results, encounter notes, upcoming appointments, etc.  Non-urgent messages can be sent to your provider as well.   To learn more about what you can do with MyChart, go to [email or web address].    Your next appointment:   6  month(s)  The format for your next appointment:   In Person  Provider:   You may see Lesleigh Noe, MD or one of the following Advanced Practice Providers on your designated Care Team:    Georgie Chard, NP    Other Instructions

## 2021-02-13 ENCOUNTER — Ambulatory Visit (INDEPENDENT_AMBULATORY_CARE_PROVIDER_SITE_OTHER): Payer: Medicaid Other

## 2021-02-13 DIAGNOSIS — I5022 Chronic systolic (congestive) heart failure: Secondary | ICD-10-CM | POA: Diagnosis not present

## 2021-02-13 DIAGNOSIS — Z95810 Presence of automatic (implantable) cardiac defibrillator: Secondary | ICD-10-CM

## 2021-02-15 ENCOUNTER — Telehealth: Payer: Self-pay

## 2021-02-15 NOTE — Progress Notes (Signed)
EPIC Encounter for ICM Monitoring  Patient Name: Travis Tran is a 65 y.o. male Date: 02/15/2021 Primary Care Physican: Osie Cheeks, MD Primary Cardiologist:Smith Electrophysiologist: Elberta Fortis 3/7/2022Weight: 151lbs  Attempted call to patient and unable to reach.  Left detailed message per DPR regarding transmission. Transmission reviewed.   Optivolthoracic impedancenormal.   Prescribed:Furosemide 40 mg1 tablet daily.   Labs: 03/09/2020 Creatinine 1.30, BUN 24, Potassium 3.9, Sodium 140, GFR 59-68 12/24/2019 Creatinine 1.70, BUN 37, Potassium 4.2, Sodium 139, GFR 43 Care Everywhere A complete set of results can be found in Results Review.  Recommendations:Left voice mail with ICM number and encouraged to call if experiencing any fluid symptoms.  Follow-up plan: ICM clinic phone appointment on4/18/2022.91 day device clinic remote transmission5/16/2022.   EP/Cardiology Office Visits:08/31/2021 with Dr Elberta Fortis and Dr Katrinka Blazing.   Copy of ICM check sent to [facility].   3 month ICM trend: 02/13/2021.    1 Year ICM trend:       Karie Soda, RN 02/15/2021 3:22 PM

## 2021-02-15 NOTE — Telephone Encounter (Signed)
Remote ICM transmission received.  Attempted call to patient regarding ICM remote transmission and left detailed message per DPR.  Advised to return call for any fluid symptoms or questions. Next ICM remote transmission scheduled 03/20/2021.     

## 2021-02-28 DIAGNOSIS — L409 Psoriasis, unspecified: Secondary | ICD-10-CM | POA: Diagnosis not present

## 2021-02-28 DIAGNOSIS — L4050 Arthropathic psoriasis, unspecified: Secondary | ICD-10-CM | POA: Diagnosis not present

## 2021-02-28 DIAGNOSIS — Z79899 Other long term (current) drug therapy: Secondary | ICD-10-CM | POA: Diagnosis not present

## 2021-03-03 DIAGNOSIS — N183 Chronic kidney disease, stage 3 unspecified: Secondary | ICD-10-CM | POA: Diagnosis not present

## 2021-03-03 DIAGNOSIS — E1065 Type 1 diabetes mellitus with hyperglycemia: Secondary | ICD-10-CM | POA: Diagnosis not present

## 2021-03-03 DIAGNOSIS — Z794 Long term (current) use of insulin: Secondary | ICD-10-CM | POA: Diagnosis not present

## 2021-03-03 DIAGNOSIS — E1042 Type 1 diabetes mellitus with diabetic polyneuropathy: Secondary | ICD-10-CM | POA: Diagnosis not present

## 2021-03-03 DIAGNOSIS — I509 Heart failure, unspecified: Secondary | ICD-10-CM | POA: Diagnosis not present

## 2021-03-03 DIAGNOSIS — E1022 Type 1 diabetes mellitus with diabetic chronic kidney disease: Secondary | ICD-10-CM | POA: Diagnosis not present

## 2021-03-07 DIAGNOSIS — N1831 Chronic kidney disease, stage 3a: Secondary | ICD-10-CM | POA: Diagnosis not present

## 2021-03-10 ENCOUNTER — Telehealth: Payer: Self-pay

## 2021-03-10 DIAGNOSIS — M0580 Other rheumatoid arthritis with rheumatoid factor of unspecified site: Secondary | ICD-10-CM | POA: Diagnosis not present

## 2021-03-10 DIAGNOSIS — Q240 Dextrocardia: Secondary | ICD-10-CM | POA: Diagnosis not present

## 2021-03-10 DIAGNOSIS — I509 Heart failure, unspecified: Secondary | ICD-10-CM | POA: Diagnosis not present

## 2021-03-10 DIAGNOSIS — N1831 Chronic kidney disease, stage 3a: Secondary | ICD-10-CM | POA: Diagnosis not present

## 2021-03-10 DIAGNOSIS — I129 Hypertensive chronic kidney disease with stage 1 through stage 4 chronic kidney disease, or unspecified chronic kidney disease: Secondary | ICD-10-CM | POA: Diagnosis not present

## 2021-03-10 DIAGNOSIS — R801 Persistent proteinuria, unspecified: Secondary | ICD-10-CM | POA: Diagnosis not present

## 2021-03-10 DIAGNOSIS — Z95810 Presence of automatic (implantable) cardiac defibrillator: Secondary | ICD-10-CM | POA: Diagnosis not present

## 2021-03-10 DIAGNOSIS — E119 Type 2 diabetes mellitus without complications: Secondary | ICD-10-CM | POA: Diagnosis not present

## 2021-03-10 NOTE — Telephone Encounter (Signed)
Returned call to patient per voice mail request.  Patient stated his Nephrologist has advised him to decrease Furosemide to 20 mg a day for a couple of weeks due to a change in kidney function.  Nephrologist will recheck Creatinine again in another 2 weeks.     Pt would like to know if Dr Katrinka Blazing is agreeable that he should decrease Furosemide dosage.  Latest lab results listed below.  Labs:  03/07/2021 Creatinine 1.7, BUN 33, Potassium 4.1, Sodium 139, GFR 42.07-48.76    Care Everywhere [facility] 02/28/2021 Creatinine 1.88, BUN 37, Potassium 4.0, Sodium 139, GFR 40                 Care Everywhere [facility]  Advised would send copy to Dr Katrinka Blazing for his recommendation if patient should decrease Furosemide to 20 mg daily as suggested by Nephrologist.   Will call patient back after when recommendation is received.

## 2021-03-13 NOTE — Telephone Encounter (Signed)
Okay to decrease dose but must weigh daily and report weight gain or swelling.

## 2021-03-14 NOTE — Telephone Encounter (Signed)
Spoke with patient.  Advised Dr Katrinka Blazing was agreeable to decreasing Furosemide dosage as suggesting by Nephrologist but he must weigh daily and report any weight gain or swelling.  Patient agreeable to plan.  He will call back if experiencing any fluid symptoms.

## 2021-03-15 ENCOUNTER — Other Ambulatory Visit: Payer: Self-pay | Admitting: Cardiology

## 2021-03-20 ENCOUNTER — Ambulatory Visit (INDEPENDENT_AMBULATORY_CARE_PROVIDER_SITE_OTHER): Payer: Medicaid Other

## 2021-03-20 DIAGNOSIS — Z95810 Presence of automatic (implantable) cardiac defibrillator: Secondary | ICD-10-CM | POA: Diagnosis not present

## 2021-03-20 DIAGNOSIS — I5022 Chronic systolic (congestive) heart failure: Secondary | ICD-10-CM | POA: Diagnosis not present

## 2021-03-21 ENCOUNTER — Telehealth: Payer: Self-pay

## 2021-03-21 NOTE — Progress Notes (Signed)
EPIC Encounter for ICM Monitoring  Patient Name: Travis Tran is a 65 y.o. male Date: 03/21/2021 Primary Care Physican: Osie Cheeks, MD Primary Cardiologist:Smith Electrophysiologist: Elberta Fortis 4/19/2022Weight: [phone]  Spoke with patient and reports he is doing fine since Nephrologist decreased Furosemide to 20 mg a day.  Nephrologist ordered labs for next week to recheck kidney function after decreasing Furosemide dosage.  Weight baseline is between 147-149.     Optivolthoracic impedance is stable after Nephrologist decreased Furosemide to 20 mg daily.   Prescribed:Furosemide 40 mg1 tablet daily.  Per patient, nephrologist decreased Furosemide to 20 mg daily on 03/10/2021.   Labs: 03/09/2020 Creatinine 1.30, BUN 24, Potassium 3.9, Sodium 140, GFR 59-68 12/24/2019 Creatinine 1.70, BUN 37, Potassium 4.2, Sodium 139, GFR 43 Care Everywhere A complete set of results can be found in Results Review.  Recommendations: Advised to continue to monitor weight closely and call if experiencing any fluid symptoms.    Follow-up plan: ICM clinic phone appointment on5/24/2022.91 day device clinic remote transmission5/16/2022.   EP/Cardiology Office Visits:08/31/2021 with Dr Elberta Fortis and Dr Katrinka Blazing.   [facility]CM check [facility].   3 month ICM trend: 03/20/2021.    1 Year ICM trend:       Karie Soda, RN 03/21/2021 4:48 PM

## 2021-03-21 NOTE — Telephone Encounter (Signed)
**Note De-Identified Farrel Guimond Obfuscation** I started a [employer] PA through covermymeds. Key: [id number]

## 2021-03-28 DIAGNOSIS — N1831 Chronic kidney disease, stage 3a: Secondary | ICD-10-CM | POA: Diagnosis not present

## 2021-03-30 ENCOUNTER — Other Ambulatory Visit: Payer: Self-pay

## 2021-03-30 DIAGNOSIS — Q240 Dextrocardia: Secondary | ICD-10-CM

## 2021-03-30 DIAGNOSIS — Z95810 Presence of automatic (implantable) cardiac defibrillator: Secondary | ICD-10-CM

## 2021-03-30 DIAGNOSIS — I10 Essential (primary) hypertension: Secondary | ICD-10-CM

## 2021-03-30 DIAGNOSIS — I5022 Chronic systolic (congestive) heart failure: Secondary | ICD-10-CM

## 2021-03-30 DIAGNOSIS — I63232 Cerebral infarction due to unspecified occlusion or stenosis of left carotid arteries: Secondary | ICD-10-CM

## 2021-03-30 DIAGNOSIS — I25810 Atherosclerosis of coronary artery bypass graft(s) without angina pectoris: Secondary | ICD-10-CM

## 2021-03-30 MED ORDER — SPIRONOLACTONE 25 MG PO TABS: 12.5000 mg | ORAL_TABLET | ORAL | 3 refills | Status: DC

## 2021-03-30 NOTE — Telephone Encounter (Signed)
Pt's medication was sent to pt's pharmacy as requested. Confirmation received.  °

## 2021-04-04 ENCOUNTER — Telehealth: Payer: Self-pay

## 2021-04-04 NOTE — Telephone Encounter (Signed)
Returned patient call per voice mail request.   He reports 5 lb weight gain in past 2 weeks since Furosemide was decreased to 20 mg by Nephrologist.  He denies any shortness of breath or lower extremity swelling.    03/20/2021 Weight: 147 lbs 04/04/2021 Weight: 152 lbs  Patients remote monitor not working today and unable to send remote transmission for review.  He was advised to call [facility] for tech assistance.  Nephrologist rechecked BMET on 4/26 and results listed below.   Taking Furosemide 20 mg 1 tablet daily.  Labs: 03/28/2021 Creatinine 1.5,   BUN 32, Potassium 4.4, Sodium 141, GFR 48.92-56.70   Care Everywhere 03/09/2020 Creatinine 1.30, BUN 24, Potassium 3.9, Sodium 140, GFR 59-68 03/07/2021 Creatinine 1.7,   BUN 33, Potassium 4.1, Sodium 139, GFR 48.76-42.07   Care Everywhere 02/28/2021 Creatinine 1.88, BUN 37, Potassium 4.0, Sodium 139, GFR 40                  Care Everywhere 12/24/2019 Creatinine 1.70, BUN 37, Potassium 4.2, Sodium 139, GFR 43                  Care Everywhere A complete set of results can be found in Results Review.  He is waiting on Nephrologist office to call him with results of 4/26 lab results but currently does not have a scheduled office visit yet.  Patient said Dr Katrinka Blazing told him to call if he started having weight gain.    Copy sent to Dr Katrinka Blazing for review regarding weight gain.

## 2021-04-05 NOTE — Telephone Encounter (Signed)
Received: Today Lyn Records, MD  Kamara Allan, Josephine Igo, RN Caller: Unspecified Burgess Estelle, 2:43 PM) Molli Knock to stay on lower dose unless swelling or dyspnea.

## 2021-04-05 NOTE — Telephone Encounter (Signed)
Lyn Records, MD  You; Julio Sicks, RN 16 minutes ago (4:21 PM)    Why was lasix dose decreased? Is he Amyah Clawson of breath? Have symptoms developed with the lower dose therapy?   Message text

## 2021-04-05 NOTE — Telephone Encounter (Signed)
Call to patient.  Advised Dr Katrinka Blazing recommended he stay on Furosemide 20 mg.  If he develops shortness of breath or swelling to call back.  Advised to continue to monitor weight as well.  He is waiting on new handset from [employer] and will arrive in 7-10 days.  ICM remote transmission scheduled for 04/24/2021.

## 2021-04-05 NOTE — Telephone Encounter (Signed)
Update information to Dr Katrinka Blazing:  Nephrologist decreased Furosemide on 03/10/2021 (see ICM Phone note)  due to patient's creatinine had increased.  Since Furosemide decreased from 40 to 20 mg daily by nephrologist, patient has gained 5 lbs in the last 2 weeks.  He was advised to call the office if weight increased or he became Travis Tran of breath.  He does not have any shortness of breath or lower extremity swelling, only weight gain.    03/28/2021 BMET results obtained by nephrologist: 03/28/2021 Creatinine 1.5,   BUN 32, Potassium 4.4, Sodium 141, GFR 48.92-56.70   Care Everywhere

## 2021-04-11 ENCOUNTER — Other Ambulatory Visit: Payer: Self-pay | Admitting: Interventional Cardiology

## 2021-04-17 ENCOUNTER — Ambulatory Visit (INDEPENDENT_AMBULATORY_CARE_PROVIDER_SITE_OTHER): Payer: Medicaid Other

## 2021-04-17 DIAGNOSIS — Z95810 Presence of automatic (implantable) cardiac defibrillator: Secondary | ICD-10-CM

## 2021-04-18 LAB — CUP PACEART REMOTE DEVICE CHECK
Battery Remaining Longevity: 63 mo
Battery Voltage: 2.98 V
Brady Statistic RV Percent Paced: 0 %
Date Time Interrogation Session: 20220516042506
HighPow Impedance: 69 Ohm
Implantable Lead Implant Date: 20170808
Implantable Lead Location: 753860
Implantable Pulse Generator Implant Date: 20170808
Lead Channel Impedance Value: 285 Ohm
Lead Channel Impedance Value: 342 Ohm
Lead Channel Pacing Threshold Amplitude: 0.625 V
Lead Channel Pacing Threshold Pulse Width: 0.4 ms
Lead Channel Sensing Intrinsic Amplitude: 14.75 mV
Lead Channel Sensing Intrinsic Amplitude: 14.75 mV
Lead Channel Setting Pacing Amplitude: 2.5 V
Lead Channel Setting Pacing Pulse Width: 0.4 ms
Lead Channel Setting Sensing Sensitivity: 0.3 mV

## 2021-04-25 ENCOUNTER — Ambulatory Visit (INDEPENDENT_AMBULATORY_CARE_PROVIDER_SITE_OTHER): Payer: Medicaid Other

## 2021-04-25 DIAGNOSIS — I5022 Chronic systolic (congestive) heart failure: Secondary | ICD-10-CM | POA: Diagnosis not present

## 2021-04-25 DIAGNOSIS — Z95810 Presence of automatic (implantable) cardiac defibrillator: Secondary | ICD-10-CM

## 2021-04-26 NOTE — Progress Notes (Signed)
EPIC Encounter for ICM Monitoring  Patient Name: Travis Tran is a 65 y.o. male Date: 04/26/2021 Primary Care Physican: Osie Cheeks, MD Primary Cardiologist:Smith Electrophysiologist: Elberta Fortis 5/25/2022Weight: 681PTE  Spoke with patient and reports feeling well at this time. Heart failure questions reviewed. Pt asymptomatic for fluid.  Optivolthoracic impedance suggesting normal fluid levels.   Prescribed:Furosemide 40 mg1 tablet daily.  Per patient, nephrologist decreased Furosemide to 20 mg daily on 03/10/2021.   Labs: 03/28/2021 Creatinine 1.5, BUN 32, Potassium 4.4, Sodium 141, GFR 48.92  Care Everywhere [facility] 03/07/2021 Creatinine 1.7, BUN 33, Potassium 4.1, Sodium 139, GFR 42.07  Care Everywhere [facility] 02/28/2021 Creatinine 1.88, BUN 37, Potassium 4.0, Sodium 139, GFR 40  A complete set of results can be found in Results Review.  Recommendations: No changes and encouraged to call if experiencing any fluid symptoms.   Follow-up plan: ICM clinic phone appointment on6/27/2022.91 day device clinic remote transmission8/15/2022.   EP/Cardiology Office Visits:08/31/2021 with Dr Elberta Fortis andDr Katrinka Blazing.   Copy of ICM check sent to [facility].   3 month ICM trend: 04/24/2021.    1 Year ICM trend:       Karie Soda, RN 04/26/2021 8:50 AM

## 2021-04-27 DIAGNOSIS — E1021 Type 1 diabetes mellitus with diabetic nephropathy: Secondary | ICD-10-CM | POA: Diagnosis not present

## 2021-04-27 DIAGNOSIS — Z79899 Other long term (current) drug therapy: Secondary | ICD-10-CM | POA: Diagnosis not present

## 2021-05-03 DIAGNOSIS — E785 Hyperlipidemia, unspecified: Secondary | ICD-10-CM | POA: Diagnosis not present

## 2021-05-03 DIAGNOSIS — I129 Hypertensive chronic kidney disease with stage 1 through stage 4 chronic kidney disease, or unspecified chronic kidney disease: Secondary | ICD-10-CM | POA: Diagnosis not present

## 2021-05-03 DIAGNOSIS — I509 Heart failure, unspecified: Secondary | ICD-10-CM | POA: Diagnosis not present

## 2021-05-03 DIAGNOSIS — D649 Anemia, unspecified: Secondary | ICD-10-CM | POA: Diagnosis not present

## 2021-05-03 DIAGNOSIS — I2510 Atherosclerotic heart disease of native coronary artery without angina pectoris: Secondary | ICD-10-CM | POA: Diagnosis not present

## 2021-05-03 DIAGNOSIS — E559 Vitamin D deficiency, unspecified: Secondary | ICD-10-CM | POA: Diagnosis not present

## 2021-05-03 DIAGNOSIS — N183 Chronic kidney disease, stage 3 unspecified: Secondary | ICD-10-CM | POA: Diagnosis not present

## 2021-05-03 DIAGNOSIS — E1021 Type 1 diabetes mellitus with diabetic nephropathy: Secondary | ICD-10-CM | POA: Diagnosis not present

## 2021-05-10 NOTE — Progress Notes (Signed)
Remote ICD transmission.   

## 2021-05-29 ENCOUNTER — Ambulatory Visit (INDEPENDENT_AMBULATORY_CARE_PROVIDER_SITE_OTHER): Payer: Medicaid Other

## 2021-05-29 DIAGNOSIS — Z95810 Presence of automatic (implantable) cardiac defibrillator: Secondary | ICD-10-CM

## 2021-05-29 DIAGNOSIS — I5022 Chronic systolic (congestive) heart failure: Secondary | ICD-10-CM | POA: Diagnosis not present

## 2021-05-31 ENCOUNTER — Telehealth: Payer: Self-pay

## 2021-05-31 NOTE — Progress Notes (Signed)
EPIC Encounter for ICM Monitoring  Patient Name: Travis Tran is a 65 y.o. male Date: 05/31/2021 Primary Care Physican: Osie Cheeks, MD Primary Cardiologist: Katrinka Blazing Electrophysiologist: Elberta Fortis 04/26/2021 Weight: 151 lbs                                        Attempted call to patient and unable to reach.  Left detailed message per DPR regarding transmission. Transmission reviewed.    Optivol thoracic impedance suggesting normal fluid levels.    Prescribed: Furosemide 40 mg 1 tablet daily.  Per patient, nephrologist decreased Furosemide to 20 mg daily on 03/10/2021.     Labs: 03/28/2021 Creatinine   1.5, BUN 32, Potassium 4.4, Sodium 141, GFR 48.92  Care Everywhere [facility] 03/07/2021 Creatinine   1.7, BUN 33, Potassium 4.1, Sodium 139, GFR 42.07  Care Everywhere [facility] 02/28/2021 Creatinine 1.88, BUN 37, Potassium 4.0, Sodium 139, GFR 40  A complete set of results can be found in Results Review.      Recommendations:  Left voice mail with ICM number and encouraged to call if experiencing any fluid symptoms.    Follow-up plan: ICM clinic phone appointment on 07/03/2021.   91 day device clinic remote transmission 07/17/2021.     EP/Cardiology Office Visits:  08/31/2021 with Dr Elberta Fortis and Dr Katrinka Blazing.     Copy of ICM check sent to Dr. Elberta Fortis.    3 month ICM trend: 05/29/2021.    1 Year ICM trend:       Karie Soda, RN 05/31/2021 2:09 PM

## 2021-05-31 NOTE — Telephone Encounter (Signed)
Remote ICM transmission received.  Attempted call to patient regarding ICM remote transmission and left detailed message per DPR.  Advised to return call for any fluid symptoms or questions. Next ICM remote transmission scheduled 07/03/2021.       

## 2021-06-06 DIAGNOSIS — E13621 Other specified diabetes mellitus with foot ulcer: Secondary | ICD-10-CM | POA: Diagnosis not present

## 2021-06-06 DIAGNOSIS — Z6824 Body mass index (BMI) 24.0-24.9, adult: Secondary | ICD-10-CM | POA: Diagnosis not present

## 2021-06-06 DIAGNOSIS — E1065 Type 1 diabetes mellitus with hyperglycemia: Secondary | ICD-10-CM | POA: Diagnosis not present

## 2021-06-06 DIAGNOSIS — E1042 Type 1 diabetes mellitus with diabetic polyneuropathy: Secondary | ICD-10-CM | POA: Diagnosis not present

## 2021-06-06 DIAGNOSIS — E1022 Type 1 diabetes mellitus with diabetic chronic kidney disease: Secondary | ICD-10-CM | POA: Diagnosis not present

## 2021-06-06 DIAGNOSIS — Z794 Long term (current) use of insulin: Secondary | ICD-10-CM | POA: Diagnosis not present

## 2021-06-22 DIAGNOSIS — H25813 Combined forms of age-related cataract, bilateral: Secondary | ICD-10-CM | POA: Diagnosis not present

## 2021-06-22 DIAGNOSIS — E113391 Type 2 diabetes mellitus with moderate nonproliferative diabetic retinopathy without macular edema, right eye: Secondary | ICD-10-CM | POA: Diagnosis not present

## 2021-06-22 DIAGNOSIS — E113292 Type 2 diabetes mellitus with mild nonproliferative diabetic retinopathy without macular edema, left eye: Secondary | ICD-10-CM | POA: Diagnosis not present

## 2021-06-22 LAB — HM DIABETES EYE EXAM

## 2021-07-03 ENCOUNTER — Ambulatory Visit (INDEPENDENT_AMBULATORY_CARE_PROVIDER_SITE_OTHER): Payer: Medicaid Other

## 2021-07-03 DIAGNOSIS — Z95810 Presence of automatic (implantable) cardiac defibrillator: Secondary | ICD-10-CM | POA: Diagnosis not present

## 2021-07-03 DIAGNOSIS — I5022 Chronic systolic (congestive) heart failure: Secondary | ICD-10-CM

## 2021-07-05 ENCOUNTER — Telehealth: Payer: Self-pay

## 2021-07-05 NOTE — Progress Notes (Signed)
EPIC Encounter for ICM Monitoring  Patient Name: Travis Tran is a 65 y.o. male Date: 07/05/2021 Primary Care Physican: Osie Cheeks, MD Primary Cardiologist: Katrinka Blazing Electrophysiologist: Elberta Fortis 04/26/2021 Weight: 151 lbs                                        Spoke with patient and heart failure questions reviewed.  Pt asymptomatic for fluid accumulation and feeling well.   Optivol thoracic impedance suggesting normal fluid levels.    Prescribed: Furosemide 40 mg 1 tablet daily.  Per patient, nephrologist decreased Furosemide to 20 mg daily on 03/10/2021.     Labs: 05/10/2021 Creatinine 2.13, BUN 43, Potassium 3.9, Sodium 140  GFR 34,       Care Everywhere 03/28/2021 Creatinine 1.5,   BUN 32, Potassium 4.4, Sodium 141, GFR 48.92  Care Everywhere [facility] 03/07/2021 Creatinine 1.7,   BUN 33, Potassium 4.1, Sodium 139, GFR 42.07  Care Everywhere [facility] 02/28/2021 Creatinine 1.88, BUN 37, Potassium 4.0, Sodium 139, GFR 40  A complete set of results can be found in Results Review.      Recommendations:  No changes and encouraged to call if experiencing any fluid symptoms.    Follow-up plan: ICM clinic phone appointment on 08/08/2021.   91 day device clinic remote transmission 07/17/2021.     EP/Cardiology Office Visits:  08/31/2021 with Dr Elberta Fortis and Dr Katrinka Blazing.     Copy of ICM check sent to Dr. Elberta Fortis.   3 month ICM trend: 07/05/2021.    1 Year ICM trend:       Karie Soda, RN 07/05/2021 12:50 PM

## 2021-07-05 NOTE — Telephone Encounter (Signed)
ICM call to patient.  Advised remote monitor is showing disconnect.  Requested to send remote transmission.  Remote transmission received.

## 2021-07-06 DIAGNOSIS — E1021 Type 1 diabetes mellitus with diabetic nephropathy: Secondary | ICD-10-CM | POA: Diagnosis not present

## 2021-07-06 DIAGNOSIS — Z79899 Other long term (current) drug therapy: Secondary | ICD-10-CM | POA: Diagnosis not present

## 2021-07-06 DIAGNOSIS — E785 Hyperlipidemia, unspecified: Secondary | ICD-10-CM | POA: Diagnosis not present

## 2021-07-06 DIAGNOSIS — E559 Vitamin D deficiency, unspecified: Secondary | ICD-10-CM | POA: Diagnosis not present

## 2021-07-17 ENCOUNTER — Ambulatory Visit (INDEPENDENT_AMBULATORY_CARE_PROVIDER_SITE_OTHER): Payer: Medicaid Other

## 2021-07-17 DIAGNOSIS — I255 Ischemic cardiomyopathy: Secondary | ICD-10-CM

## 2021-07-19 DIAGNOSIS — E785 Hyperlipidemia, unspecified: Secondary | ICD-10-CM | POA: Diagnosis not present

## 2021-07-19 DIAGNOSIS — D649 Anemia, unspecified: Secondary | ICD-10-CM | POA: Diagnosis not present

## 2021-07-19 DIAGNOSIS — I2510 Atherosclerotic heart disease of native coronary artery without angina pectoris: Secondary | ICD-10-CM | POA: Diagnosis not present

## 2021-07-19 DIAGNOSIS — E1021 Type 1 diabetes mellitus with diabetic nephropathy: Secondary | ICD-10-CM | POA: Diagnosis not present

## 2021-07-19 DIAGNOSIS — E559 Vitamin D deficiency, unspecified: Secondary | ICD-10-CM | POA: Diagnosis not present

## 2021-07-19 DIAGNOSIS — I509 Heart failure, unspecified: Secondary | ICD-10-CM | POA: Diagnosis not present

## 2021-07-19 DIAGNOSIS — N183 Chronic kidney disease, stage 3 unspecified: Secondary | ICD-10-CM | POA: Diagnosis not present

## 2021-07-19 DIAGNOSIS — I129 Hypertensive chronic kidney disease with stage 1 through stage 4 chronic kidney disease, or unspecified chronic kidney disease: Secondary | ICD-10-CM | POA: Diagnosis not present

## 2021-07-19 LAB — CUP PACEART REMOTE DEVICE CHECK
Battery Remaining Longevity: 53 mo
Battery Voltage: 2.96 V
Brady Statistic RV Percent Paced: 0.01 %
Date Time Interrogation Session: 20220815112826
HighPow Impedance: 64 Ohm
Implantable Lead Implant Date: 20170808
Implantable Lead Location: 753860
Implantable Pulse Generator Implant Date: 20170808
Lead Channel Impedance Value: 247 Ohm
Lead Channel Impedance Value: 342 Ohm
Lead Channel Pacing Threshold Amplitude: 0.5 V
Lead Channel Pacing Threshold Pulse Width: 0.4 ms
Lead Channel Sensing Intrinsic Amplitude: 14.75 mV
Lead Channel Sensing Intrinsic Amplitude: 14.75 mV
Lead Channel Setting Pacing Amplitude: 2.5 V
Lead Channel Setting Pacing Pulse Width: 0.4 ms
Lead Channel Setting Sensing Sensitivity: 0.3 mV

## 2021-08-04 NOTE — Progress Notes (Signed)
Remote ICD transmission.   

## 2021-08-08 ENCOUNTER — Ambulatory Visit (INDEPENDENT_AMBULATORY_CARE_PROVIDER_SITE_OTHER): Payer: Medicaid Other

## 2021-08-08 DIAGNOSIS — Z95810 Presence of automatic (implantable) cardiac defibrillator: Secondary | ICD-10-CM | POA: Diagnosis not present

## 2021-08-08 DIAGNOSIS — I5022 Chronic systolic (congestive) heart failure: Secondary | ICD-10-CM | POA: Diagnosis not present

## 2021-08-08 NOTE — Progress Notes (Signed)
EPIC Encounter for ICM Monitoring  Patient Name: Travis Tran is a 65 y.o. male Date: 08/08/2021 Primary Care Physican: Osie Cheeks, MD Primary Cardiologist: Katrinka Blazing Electrophysiologist: Elberta Fortis 08/08/2021 Weight: 150 lbs                                   Spoke with patient and heart failure questions reviewed.  Pt asymptomatic for fluid accumulation and feeling well.  Weight stable and no swelling or SOB.  He did eat foods that were high in salt over the weekend which may contribute to decreased impedance.   Optivol thoracic impedance suggesting possible fluid accumulation starting 08/06/2021.    Prescribed: Furosemide 40 mg 1 tablet daily.  Per patient, nephrologist decreased Furosemide to 20 mg daily on 03/10/2021.     Labs: 05/10/2021 Creatinine 2.13, BUN 43, Potassium 3.9, Sodium 140  GFR 34,       Care Everywhere 03/28/2021 Creatinine 1.5,   BUN 32, Potassium 4.4, Sodium 141, GFR 48.92  Care Everywhere [facility] 03/07/2021 Creatinine 1.7,   BUN 33, Potassium 4.1, Sodium 139, GFR 42.07  Care Everywhere [facility] 02/28/2021 Creatinine 1.88, BUN 37, Potassium 4.0, Sodium 139, GFR 40  A complete set of results can be found in Results Review.      Recommendations:  Advised to adjust diet and limit salt intake.  Ecouraged to call if experiencing any fluid symptoms.    Follow-up plan: ICM clinic phone appointment on 08/14/2021 (manual) to recheck fluid levels.   91 day device clinic remote transmission 10/16/2021.     EP/Cardiology Office Visits:  08/31/2021 with Dr Elberta Fortis and Dr Katrinka Blazing.     Copy of ICM check sent to Dr. Elberta Fortis and Dr Katrinka Blazing for review.     3 month ICM trend: 08/08/2021.    1 Year ICM trend:       Karie Soda, RN 08/08/2021 3:49 PM

## 2021-08-14 ENCOUNTER — Ambulatory Visit (INDEPENDENT_AMBULATORY_CARE_PROVIDER_SITE_OTHER): Payer: Medicaid Other

## 2021-08-14 DIAGNOSIS — I5022 Chronic systolic (congestive) heart failure: Secondary | ICD-10-CM

## 2021-08-14 DIAGNOSIS — Z95810 Presence of automatic (implantable) cardiac defibrillator: Secondary | ICD-10-CM

## 2021-08-15 NOTE — Progress Notes (Signed)
EPIC Encounter for ICM Monitoring  Patient Name: Travis Tran is a 65 y.o. male Date: 08/15/2021 Primary Care Physican: Osie Cheeks, MD Primary Cardiologist: Katrinka Blazing Electrophysiologist: Elberta Fortis 08/08/2021 Weight: 150 lbs                                   Spoke with patient and heart failure questions reviewed.  Pt asymptomatic for fluid accumulation and feeling well.   He occasionally has foods high in salt.   Optivol thoracic impedance suggesting returned briefly to baseline since 9/6 but started downward trend again 9/9 suggesting possible fluid accumulation has returned.    Prescribed: Furosemide 40 mg 1 tablet daily.  Per patient, nephrologist decreased Furosemide to 20 mg daily on 03/10/2021.     Labs: 05/10/2021 Creatinine 2.13, BUN 43, Potassium 3.9, Sodium 140  GFR 34,       Care Everywhere 03/28/2021 Creatinine 1.5,   BUN 32, Potassium 4.4, Sodium 141, GFR 48.92  Care Everywhere [facility] 03/07/2021 Creatinine 1.7,   BUN 33, Potassium 4.1, Sodium 139, GFR 42.07  Care Everywhere [facility] 02/28/2021 Creatinine 1.88, BUN 37, Potassium 4.0, Sodium 139, GFR 40  A complete set of results can be found in Results Review.      Recommendations:  Advised to adjust diet and limit salt intake.  Advised to call if he experiences any fluid symptoms.      Follow-up plan: ICM clinic phone appointment on 08/28/2021 to recheck fluid levels.   91 day device clinic remote transmission 10/16/2021.     EP/Cardiology Office Visits:  08/31/2021 with Dr Elberta Fortis and Dr Katrinka Blazing.     Copy of ICM check sent to Dr. Elberta Fortis.  3 month ICM trend: 08/14/2021.    1 Year ICM trend:       Karie Soda, RN 08/15/2021 3:54 PM

## 2021-08-24 DIAGNOSIS — H2511 Age-related nuclear cataract, right eye: Secondary | ICD-10-CM | POA: Diagnosis not present

## 2021-08-24 DIAGNOSIS — E113393 Type 2 diabetes mellitus with moderate nonproliferative diabetic retinopathy without macular edema, bilateral: Secondary | ICD-10-CM | POA: Diagnosis not present

## 2021-08-24 LAB — HM DIABETES EYE EXAM

## 2021-08-28 ENCOUNTER — Ambulatory Visit (INDEPENDENT_AMBULATORY_CARE_PROVIDER_SITE_OTHER): Payer: Medicaid Other

## 2021-08-28 DIAGNOSIS — I5022 Chronic systolic (congestive) heart failure: Secondary | ICD-10-CM

## 2021-08-28 DIAGNOSIS — Z95810 Presence of automatic (implantable) cardiac defibrillator: Secondary | ICD-10-CM

## 2021-08-28 NOTE — Progress Notes (Signed)
EPIC Encounter for ICM Monitoring  Patient Name: Travis Tran is a 65 y.o. male Date: 08/28/2021 Primary Care Physican: Osie Cheeks, MD Primary Cardiologist: Katrinka Blazing Electrophysiologist: Elberta Fortis 08/08/2021 Weight: 150 lbs                                   Spoke with patient and heart failure questions reviewed.  Pt asymptomatic for fluid accumulation and feeling well.   Optivol thoracic impedance suggesting fluid levels returned to normal.    Prescribed: Furosemide 40 mg 1 tablet daily.  Per patient, nephrologist decreased Furosemide to 20 mg daily on 03/10/2021.     Labs: 05/10/2021 Creatinine 2.13, BUN 43, Potassium 3.9, Sodium 140  GFR 34,       Care Everywhere 03/28/2021 Creatinine 1.5,   BUN 32, Potassium 4.4, Sodium 141, GFR 48.92  Care Everywhere [facility] 03/07/2021 Creatinine 1.7,   BUN 33, Potassium 4.1, Sodium 139, GFR 42.07  Care Everywhere [facility] 02/28/2021 Creatinine 1.88, BUN 37, Potassium 4.0, Sodium 139, GFR 40  A complete set of results can be found in Results Review.      Recommendations:  No changes and encouraged to call if experiencing any fluid symptoms.    Follow-up plan: ICM clinic phone appointment on 09/25/2021.   91 day device clinic remote transmission 10/16/2021.     EP/Cardiology Office Visits:  08/31/2021 with Otilio Saber, PA and Dr Katrinka Blazing.     Copy of ICM check sent to Dr. Elberta Fortis.   3 month ICM trend: 08/28/2021.    1 Year ICM trend:       Karie Soda, RN 08/28/2021 12:51 PM
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
## 2021-08-31 ENCOUNTER — Other Ambulatory Visit: Payer: Self-pay

## 2021-08-31 ENCOUNTER — Ambulatory Visit (INDEPENDENT_AMBULATORY_CARE_PROVIDER_SITE_OTHER): Payer: Medicaid Other | Admitting: Interventional Cardiology

## 2021-08-31 ENCOUNTER — Encounter: Payer: Self-pay | Admitting: Interventional Cardiology

## 2021-08-31 ENCOUNTER — Encounter: Payer: Self-pay | Admitting: Student

## 2021-08-31 ENCOUNTER — Ambulatory Visit (HOSPITAL_COMMUNITY): Payer: Medicaid Other | Attending: Cardiology

## 2021-08-31 ENCOUNTER — Ambulatory Visit (INDEPENDENT_AMBULATORY_CARE_PROVIDER_SITE_OTHER): Payer: Medicaid Other | Admitting: Student

## 2021-08-31 ENCOUNTER — Telehealth: Payer: Self-pay | Admitting: Interventional Cardiology

## 2021-08-31 ENCOUNTER — Encounter: Payer: Medicaid Other | Admitting: Cardiology

## 2021-08-31 VITALS — BP 124/68 | HR 78 | Ht 66.0 in | Wt 154.2 lb

## 2021-08-31 DIAGNOSIS — I10 Essential (primary) hypertension: Secondary | ICD-10-CM

## 2021-08-31 DIAGNOSIS — I480 Paroxysmal atrial fibrillation: Secondary | ICD-10-CM | POA: Diagnosis not present

## 2021-08-31 DIAGNOSIS — I5022 Chronic systolic (congestive) heart failure: Secondary | ICD-10-CM | POA: Insufficient documentation

## 2021-08-31 DIAGNOSIS — Z95810 Presence of automatic (implantable) cardiac defibrillator: Secondary | ICD-10-CM

## 2021-08-31 DIAGNOSIS — I779 Disorder of arteries and arterioles, unspecified: Secondary | ICD-10-CM

## 2021-08-31 DIAGNOSIS — I255 Ischemic cardiomyopathy: Secondary | ICD-10-CM | POA: Diagnosis not present

## 2021-08-31 DIAGNOSIS — L4050 Arthropathic psoriasis, unspecified: Secondary | ICD-10-CM | POA: Diagnosis not present

## 2021-08-31 DIAGNOSIS — Q240 Dextrocardia: Secondary | ICD-10-CM | POA: Diagnosis not present

## 2021-08-31 DIAGNOSIS — I25810 Atherosclerosis of coronary artery bypass graft(s) without angina pectoris: Secondary | ICD-10-CM | POA: Diagnosis not present

## 2021-08-31 DIAGNOSIS — E1152 Type 2 diabetes mellitus with diabetic peripheral angiopathy with gangrene: Secondary | ICD-10-CM

## 2021-08-31 DIAGNOSIS — Z79899 Other long term (current) drug therapy: Secondary | ICD-10-CM | POA: Diagnosis not present

## 2021-08-31 DIAGNOSIS — L409 Psoriasis, unspecified: Secondary | ICD-10-CM | POA: Diagnosis not present

## 2021-08-31 LAB — CUP PACEART INCLINIC DEVICE CHECK
Battery Remaining Longevity: 44 mo
Battery Voltage: 2.96 V
Brady Statistic RV Percent Paced: 0.01 %
Date Time Interrogation Session: 20220929095133
HighPow Impedance: 63 Ohm
Implantable Lead Implant Date: 20170808
Implantable Lead Location: 753860
Implantable Pulse Generator Implant Date: 20170808
Lead Channel Impedance Value: 285 Ohm
Lead Channel Impedance Value: 361 Ohm
Lead Channel Pacing Threshold Amplitude: 0.5 V
Lead Channel Pacing Threshold Pulse Width: 0.4 ms
Lead Channel Sensing Intrinsic Amplitude: 10.375 mV
Lead Channel Sensing Intrinsic Amplitude: 13.125 mV
Lead Channel Setting Pacing Amplitude: 2.5 V
Lead Channel Setting Pacing Pulse Width: 0.4 ms
Lead Channel Setting Sensing Sensitivity: 0.3 mV

## 2021-08-31 LAB — ECHOCARDIOGRAM COMPLETE
Area-P 1/2: 4.31 cm2
S' Lateral: 3.6 cm

## 2021-08-31 NOTE — Patient Instructions (Addendum)
Medication Instructions:  Your physician recommends that you continue on your current medications as directed. Please refer to the Current Medication list given to you today.  *If you need a refill on your cardiac medications before your next appointment, please call your pharmacy*   Lab Work: Lipid, Liver, BMET, CBC and Pro BNP today  If you have labs (blood work) drawn today and your tests are completely normal, you will receive your results only by: MyChart Message (if you have MyChart) OR A paper copy in the mail If you have any lab test that is abnormal or we need to change your treatment, we will call you to review the results.   Testing/Procedures: Your physician has requested that you have a carotid duplex. This test is an ultrasound of the carotid arteries in your neck. It looks at blood flow through these arteries that supply the brain with blood. Allow one hour for this exam. There are no restrictions or special instructions.   Follow-Up: At [facility], you and your health needs are our priority.  As part of our continuing mission to provide you with exceptional heart care, we have created designated Provider Care Teams.  These Care Teams include your primary Cardiologist (physician) and Advanced Practice Providers (APPs -  Physician Assistants and Nurse Practitioners) who all work together to provide you with the care you need, when you need it.  We recommend signing up for the patient portal called "MyChart".  Sign up information is provided on this After Visit Summary.  MyChart is used to connect with patients for Virtual Visits (Telemedicine).  Patients are able to view lab/test results, encounter notes, upcoming appointments, etc.  Non-urgent messages can be sent to your provider as well.   To learn more about what you can do with MyChart, go to [email or web address].    Your next appointment:   6 month(s)  The format for your next appointment:   In  Person  Provider:   You may see Lesleigh Noe, MD or one of the following Advanced Practice Providers on your designated Care Team:   Nada Boozer, NP   Other Instructions

## 2021-08-31 NOTE — Patient Instructions (Signed)
Medication Instructions:  Your physician recommends that you continue on your current medications as directed. Please refer to the Current Medication list given to you today.  *If you need a refill on your cardiac medications before your next appointment, please call your pharmacy*   Lab Work: None If you have labs (blood work) drawn today and your tests are completely normal, you will receive your results only by: MyChart Message (if you have MyChart) OR A paper copy in the mail If you have any lab test that is abnormal or we need to change your treatment, we will call you to review the results.  Follow-Up: At [facility], you and your health needs are our priority.  As part of our continuing mission to provide you with exceptional heart care, we have created designated Provider Care Teams.  These Care Teams include your primary Cardiologist (physician) and Advanced Practice Providers (APPs -  Physician Assistants and Nurse Practitioners) who all work together to provide you with the care you need, when you need it.   Your next appointment:   1 year(s)  The format for your next appointment:   In Person  Provider:   You may see Will Martin Camnitz, MD or one of the following Advanced Practice Providers on your designated Care Team:   Renee Ursuy, PA-C Michael "Andy" Tillery, PA-C   

## 2021-08-31 NOTE — Telephone Encounter (Signed)
   [place] Pre-operative Risk Assessment    Patient Name: Travis Tran  DOB: 01-07-1956 MRN: [id number]  HEARTCARE STAFF:  - IMPORTANT!!!!!! Under Visit Info/Reason for Call, type in Other and utilize the format Clearance MM/DD/YY or Clearance TBD. Do not use dashes or single digits. - Please review there is not already an duplicate clearance open for this procedure. - If request is for dental extraction, please clarify the # of teeth to be extracted. - If the patient is currently at the dentist's office, call Pre-Op Callback Staff (MA/nurse) to input urgent request.  - If the patient is not currently in the dentist office, please route to the Pre-Op pool.  Request for surgical clearance:  What type of surgery is being performed? Cataract surgery   When is this surgery scheduled? 09/06/2021 right eye and 09/20/2021 left eye  What type of clearance is required (medical clearance vs. Pharmacy clearance to hold med vs. Both)? Medical  Are there any medications that need to be held prior to surgery and how long? no  Practice name and name of physician performing surgery? [place], Dr. Ellie Lunch  What is the office phone number? [phone]   7.   What is the office fax number? [phone]  8.   Anesthesia type (None, local, MAC, general) ? MAC   Selena Zobro 08/31/2021, 4:28 PM  _________________________________________________________________   (provider comments below)   Office also requesting a copy of the patient's echo results

## 2021-09-01 ENCOUNTER — Telehealth: Payer: Self-pay | Admitting: *Deleted

## 2021-09-01 DIAGNOSIS — I10 Essential (primary) hypertension: Secondary | ICD-10-CM

## 2021-09-01 DIAGNOSIS — I5022 Chronic systolic (congestive) heart failure: Secondary | ICD-10-CM

## 2021-09-01 DIAGNOSIS — I255 Ischemic cardiomyopathy: Secondary | ICD-10-CM

## 2021-09-01 LAB — BASIC METABOLIC PANEL
BUN/Creatinine Ratio: 17 (ref 10–24)
BUN: 32 mg/dL — ABNORMAL HIGH (ref 8–27)
CO2: 23 mmol/L (ref 20–29)
Calcium: 9.7 mg/dL (ref 8.6–10.2)
Chloride: 105 mmol/L (ref 96–106)
Creatinine, Ser: 1.88 mg/dL — ABNORMAL HIGH (ref 0.76–1.27)
Glucose: 136 mg/dL — ABNORMAL HIGH (ref 70–99)
Potassium: 4.5 mmol/L (ref 3.5–5.2)
Sodium: 141 mmol/L (ref 134–144)
eGFR: 40 mL/min/{1.73_m2} — ABNORMAL LOW (ref >59)

## 2021-09-01 LAB — LIPID PANEL
Chol/HDL Ratio: 2.2 ratio (ref 0.0–5.0)
Cholesterol, Total: 169 mg/dL (ref 100–199)
HDL: 77 mg/dL (ref >39)
LDL Chol Calc (NIH): 81 mg/dL (ref 0–99)
Triglycerides: 56 mg/dL (ref 0–149)
VLDL Cholesterol Cal: 11 mg/dL (ref 5–40)

## 2021-09-01 LAB — CBC
Hematocrit: 40.5 % (ref 37.5–51.0)
Hemoglobin: 13.1 g/dL (ref 13.0–17.7)
MCH: 28.2 pg (ref 26.6–33.0)
MCHC: 32.3 g/dL (ref 31.5–35.7)
MCV: 87 fL (ref 79–97)
Platelets: 189 10*3/uL (ref 150–450)
RBC: 4.64 x10E6/uL (ref 4.14–5.80)
RDW: 12.7 % (ref 11.6–15.4)
WBC: 9.4 10*3/uL (ref 3.4–10.8)

## 2021-09-01 LAB — HEPATIC FUNCTION PANEL
ALT: 8 IU/L (ref 0–44)
AST: 17 IU/L (ref 0–40)
Albumin: 4.4 g/dL (ref 3.8–4.8)
Alkaline Phosphatase: 59 IU/L (ref 44–121)
Bilirubin Total: 0.5 mg/dL (ref 0.0–1.2)
Bilirubin, Direct: 0.18 mg/dL (ref 0.00–0.40)
Total Protein: 6.7 g/dL (ref 6.0–8.5)

## 2021-09-01 LAB — PRO B NATRIURETIC PEPTIDE: NT-Pro BNP: 2219 pg/mL — ABNORMAL HIGH (ref 0–210)

## 2021-09-01 NOTE — Telephone Encounter (Signed)
   Patient Name: Travis Tran  DOB: 12/10/1955 MRN: [id number]  Primary Cardiologist: Lesleigh Noe, MD  Chart reviewed as part of pre-operative protocol coverage. Cataract extractions are recognized in guidelines as low risk surgeries that do not typically require specific preoperative testing or holding of blood thinner therapy. Therefore, given past medical history and time since last visit, based on ACC/AHA guidelines, Travis Tran would be at acceptable risk for the planned procedure without further cardiovascular testing.   I will route this recommendation to the requesting party via Epic fax function and remove from pre-op pool.  Please call with questions.  Roe Rutherford [place]iana [place], PA 09/01/2021, 9:10 AM

## 2021-09-01 NOTE — Telephone Encounter (Signed)
-----   Message from Lyn Records, MD sent at 09/01/2021  7:54 AM EDT ----- Let the patient know he should discontinue Spironolactone as it is putting stress on kidneys. Continue furosemide. Recheck BMET when he returns for carotid imaging later this year. A copy will be sent to Osie Cheeks, MD

## 2021-09-01 NOTE — Telephone Encounter (Signed)
The patient has been notified of the result and verbalized understanding.  All questions (if any) were answered. Sampson Goon, RN 09/01/2021 9:42 AM    Removed Spironolactone from medication list. Ordered BMP and scheduled same day as carotid imaging.

## 2021-09-05 MED ORDER — SPIRONOLACTONE 25 MG PO TABS: 12.5000 mg | ORAL_TABLET | ORAL | 3 refills | Status: DC

## 2021-09-05 NOTE — Addendum Note (Signed)
Addended by: Bernell List on: 09/05/2021 03:43 PM   Modules accepted: Orders

## 2021-09-05 NOTE — Addendum Note (Signed)
Addended by: Julio Sicks on: 09/05/2021 11:53 AM   Modules accepted: Orders

## 2021-09-06 DIAGNOSIS — I2510 Atherosclerotic heart disease of native coronary artery without angina pectoris: Secondary | ICD-10-CM | POA: Diagnosis not present

## 2021-09-06 DIAGNOSIS — M069 Rheumatoid arthritis, unspecified: Secondary | ICD-10-CM | POA: Diagnosis not present

## 2021-09-06 DIAGNOSIS — N189 Chronic kidney disease, unspecified: Secondary | ICD-10-CM | POA: Diagnosis not present

## 2021-09-06 DIAGNOSIS — E113393 Type 2 diabetes mellitus with moderate nonproliferative diabetic retinopathy without macular edema, bilateral: Secondary | ICD-10-CM | POA: Diagnosis not present

## 2021-09-06 DIAGNOSIS — E7800 Pure hypercholesterolemia, unspecified: Secondary | ICD-10-CM | POA: Diagnosis not present

## 2021-09-06 DIAGNOSIS — J45909 Unspecified asthma, uncomplicated: Secondary | ICD-10-CM | POA: Diagnosis not present

## 2021-09-06 DIAGNOSIS — D649 Anemia, unspecified: Secondary | ICD-10-CM | POA: Diagnosis not present

## 2021-09-06 DIAGNOSIS — E1122 Type 2 diabetes mellitus with diabetic chronic kidney disease: Secondary | ICD-10-CM | POA: Diagnosis not present

## 2021-09-06 DIAGNOSIS — I130 Hypertensive heart and chronic kidney disease with heart failure and stage 1 through stage 4 chronic kidney disease, or unspecified chronic kidney disease: Secondary | ICD-10-CM | POA: Diagnosis not present

## 2021-09-06 DIAGNOSIS — H269 Unspecified cataract: Secondary | ICD-10-CM | POA: Diagnosis not present

## 2021-09-06 DIAGNOSIS — Q240 Dextrocardia: Secondary | ICD-10-CM | POA: Diagnosis not present

## 2021-09-06 DIAGNOSIS — H2511 Age-related nuclear cataract, right eye: Secondary | ICD-10-CM | POA: Diagnosis not present

## 2021-09-12 DIAGNOSIS — N1831 Chronic kidney disease, stage 3a: Secondary | ICD-10-CM | POA: Diagnosis not present

## 2021-09-12 DIAGNOSIS — H2512 Age-related nuclear cataract, left eye: Secondary | ICD-10-CM | POA: Diagnosis not present

## 2021-09-12 DIAGNOSIS — Z9841 Cataract extraction status, right eye: Secondary | ICD-10-CM | POA: Diagnosis not present

## 2021-09-14 ENCOUNTER — Other Ambulatory Visit: Payer: Self-pay | Admitting: Cardiology

## 2021-09-20 DIAGNOSIS — Z961 Presence of intraocular lens: Secondary | ICD-10-CM | POA: Diagnosis not present

## 2021-09-20 DIAGNOSIS — I509 Heart failure, unspecified: Secondary | ICD-10-CM | POA: Diagnosis not present

## 2021-09-20 DIAGNOSIS — D649 Anemia, unspecified: Secondary | ICD-10-CM | POA: Diagnosis not present

## 2021-09-20 DIAGNOSIS — I2510 Atherosclerotic heart disease of native coronary artery without angina pectoris: Secondary | ICD-10-CM | POA: Diagnosis not present

## 2021-09-20 DIAGNOSIS — M6281 Muscle weakness (generalized): Secondary | ICD-10-CM | POA: Diagnosis not present

## 2021-09-20 DIAGNOSIS — Z9841 Cataract extraction status, right eye: Secondary | ICD-10-CM | POA: Diagnosis not present

## 2021-09-20 DIAGNOSIS — E119 Type 2 diabetes mellitus without complications: Secondary | ICD-10-CM | POA: Diagnosis not present

## 2021-09-20 DIAGNOSIS — I110 Hypertensive heart disease with heart failure: Secondary | ICD-10-CM | POA: Diagnosis not present

## 2021-09-20 DIAGNOSIS — H2512 Age-related nuclear cataract, left eye: Secondary | ICD-10-CM | POA: Diagnosis not present

## 2021-09-20 DIAGNOSIS — J45909 Unspecified asthma, uncomplicated: Secondary | ICD-10-CM | POA: Diagnosis not present

## 2021-09-20 DIAGNOSIS — M1990 Unspecified osteoarthritis, unspecified site: Secondary | ICD-10-CM | POA: Diagnosis not present

## 2021-09-20 DIAGNOSIS — Z794 Long term (current) use of insulin: Secondary | ICD-10-CM | POA: Diagnosis not present

## 2021-09-22 DIAGNOSIS — I6529 Occlusion and stenosis of unspecified carotid artery: Secondary | ICD-10-CM | POA: Diagnosis not present

## 2021-09-22 DIAGNOSIS — N1831 Chronic kidney disease, stage 3a: Secondary | ICD-10-CM | POA: Diagnosis not present

## 2021-09-22 DIAGNOSIS — M0560 Rheumatoid arthritis of unspecified site with involvement of other organs and systems: Secondary | ICD-10-CM | POA: Diagnosis not present

## 2021-09-22 DIAGNOSIS — I130 Hypertensive heart and chronic kidney disease with heart failure and stage 1 through stage 4 chronic kidney disease, or unspecified chronic kidney disease: Secondary | ICD-10-CM | POA: Diagnosis not present

## 2021-09-22 DIAGNOSIS — R801 Persistent proteinuria, unspecified: Secondary | ICD-10-CM | POA: Diagnosis not present

## 2021-09-22 DIAGNOSIS — I509 Heart failure, unspecified: Secondary | ICD-10-CM | POA: Diagnosis not present

## 2021-09-22 DIAGNOSIS — Q240 Dextrocardia: Secondary | ICD-10-CM | POA: Diagnosis not present

## 2021-09-22 DIAGNOSIS — Z95810 Presence of automatic (implantable) cardiac defibrillator: Secondary | ICD-10-CM | POA: Diagnosis not present

## 2021-09-22 DIAGNOSIS — E119 Type 2 diabetes mellitus without complications: Secondary | ICD-10-CM | POA: Diagnosis not present

## 2021-09-25 ENCOUNTER — Ambulatory Visit (INDEPENDENT_AMBULATORY_CARE_PROVIDER_SITE_OTHER): Payer: Medicaid Other

## 2021-09-25 DIAGNOSIS — Z95810 Presence of automatic (implantable) cardiac defibrillator: Secondary | ICD-10-CM | POA: Diagnosis not present

## 2021-09-25 DIAGNOSIS — I5022 Chronic systolic (congestive) heart failure: Secondary | ICD-10-CM

## 2021-09-27 MED ORDER — FUROSEMIDE 20 MG PO TABS: 20.0000 mg | ORAL_TABLET | ORAL | 1 refills | Status: DC

## 2021-09-28 ENCOUNTER — Telehealth: Payer: Self-pay

## 2021-09-28 NOTE — Telephone Encounter (Signed)
LMOVM for pt to call us about missed ICM transmission.

## 2021-09-28 NOTE — Telephone Encounter (Signed)
Patient was returning call 

## 2021-09-29 NOTE — Progress Notes (Signed)
EPIC Encounter for ICM Monitoring  Patient Name: Travis Tran is a 65 y.o. male Date: 09/29/2021 Primary Care Physican: Osie Cheeks, MD Primary Cardiologist: Katrinka Blazing Electrophysiologist: Elberta Fortis 08/08/2021 Weight: 150 lbs      09/29/2021 Weight: 151 lbs                              Spoke with patient and heart failure questions reviewed.  Pt asymptomatic for fluid accumulation and feeling well.  Kidney physician decreased Furosemide last week from every other day to 3 days a week and he discussed the change in dosage with Dr Katrinka Blazing.   Optivol thoracic impedance suggesting possible fluid accumulation since 10/20 which correlates with decrease in Furosemide dosage.    Prescribed:  Furosemide 20 mg Take 1 tablet (20 mg total) by mouth 3 (three) times a week. Take on Mondays, Weds, and Fridays.   Labs: 08/31/2021 Creatinine 1.88, BUN 32, Potassium 4.5, Sodium 141, GFR 40 05/10/2021 Creatinine 2.13, BUN 43, Potassium 3.9, Sodium 140  GFR 34,       Care Everywhere 03/28/2021 Creatinine 1.5,   BUN 32, Potassium 4.4, Sodium 141, GFR 48.92  Care Everywhere [facility] 03/07/2021 Creatinine 1.7,   BUN 33, Potassium 4.1, Sodium 139, GFR 42.07  Care Everywhere [facility] 02/28/2021 Creatinine 1.88, BUN 37, Potassium 4.0, Sodium 139, GFR 40  A complete set of results can be found in Results Review.      Recommendations:  He would like to wait a couple of weeks to recheck fluid levels on lower Furosemide dosage. No changes and encouraged to call if experiencing any fluid symptoms.    Follow-up plan: ICM clinic phone appointment on 10/09/2021 to recheck fluid levels.   91 day device clinic remote transmission 10/16/2021.     EP/Cardiology Office Visits:  02/21/2022 with Dr Katrinka Blazing.     Copy of ICM check sent to Dr. Elberta Fortis and Dr Katrinka Blazing as Lorain Childes.    3 month ICM trend: 09/28/2021.    1 Year ICM trend:       Karie Soda, RN 09/29/2021 9:40 AM

## 2021-10-03 NOTE — Telephone Encounter (Signed)
  Transmission received 09/28/2021

## 2021-10-06 DIAGNOSIS — Z6825 Body mass index (BMI) 25.0-25.9, adult: Secondary | ICD-10-CM | POA: Diagnosis not present

## 2021-10-06 DIAGNOSIS — E1065 Type 1 diabetes mellitus with hyperglycemia: Secondary | ICD-10-CM | POA: Diagnosis not present

## 2021-10-06 DIAGNOSIS — Z794 Long term (current) use of insulin: Secondary | ICD-10-CM | POA: Diagnosis not present

## 2021-10-06 DIAGNOSIS — E1022 Type 1 diabetes mellitus with diabetic chronic kidney disease: Secondary | ICD-10-CM | POA: Diagnosis not present

## 2021-10-06 DIAGNOSIS — E1042 Type 1 diabetes mellitus with diabetic polyneuropathy: Secondary | ICD-10-CM | POA: Diagnosis not present

## 2021-10-06 DIAGNOSIS — E13621 Other specified diabetes mellitus with foot ulcer: Secondary | ICD-10-CM | POA: Diagnosis not present

## 2021-10-09 ENCOUNTER — Ambulatory Visit (INDEPENDENT_AMBULATORY_CARE_PROVIDER_SITE_OTHER): Payer: Medicaid Other

## 2021-10-09 DIAGNOSIS — I5022 Chronic systolic (congestive) heart failure: Secondary | ICD-10-CM

## 2021-10-09 DIAGNOSIS — Z95810 Presence of automatic (implantable) cardiac defibrillator: Secondary | ICD-10-CM

## 2021-10-10 NOTE — Progress Notes (Signed)
EPIC Encounter for ICM Monitoring  Patient Name: Travis Tran is a 65 y.o. male Date: 10/10/2021 Primary Care Physican: Osie Cheeks, MD Primary Cardiologist: Katrinka Blazing Electrophysiologist: Elberta Fortis 08/08/2021 Weight: 150 lbs      09/29/2021 Weight: 151 lbs                              Spoke with patient and heart failure questions reviewed.  Pt asymptomatic for fluid accumulation.  He has noticed being Travis Tran of breath and using inhaler more often since Lasix lowered to 3 days a week.   Optivol thoracic impedance suggesting fluid levels returned normal.    Prescribed:  Furosemide 20 mg Take 1 tablet (20 mg total) by mouth 3 (three) times a week. Take on Mondays, Weds, and Fridays.   Labs: 08/31/2021 Creatinine 1.88, BUN 32, Potassium 4.5, Sodium 141, GFR 40 05/10/2021 Creatinine 2.13, BUN 43, Potassium 3.9, Sodium 140  GFR 34,       Care Everywhere 03/28/2021 Creatinine 1.5,   BUN 32, Potassium 4.4, Sodium 141, GFR 48.92  Care Everywhere [facility] 03/07/2021 Creatinine 1.7,   BUN 33, Potassium 4.1, Sodium 139, GFR 42.07  Care Everywhere [facility] 02/28/2021 Creatinine 1.88, BUN 37, Potassium 4.0, Sodium 139, GFR 40  A complete set of results can be found in Results Review.      Recommendations:    No changes and encouraged to call if experiencing any fluid symptoms.    Follow-up plan: ICM clinic phone appointment on 11/13/2021.   91 day device clinic remote transmission 10/16/2021.     EP/Cardiology Office Visits:  02/21/2022 with Dr Katrinka Blazing.     Copy of ICM check sent to Dr. Elberta Fortis.    3 month ICM trend: 10/09/2021.    1 Year ICM trend:       Karie Soda, RN 10/10/2021 1:14 PM

## 2021-10-11 ENCOUNTER — Other Ambulatory Visit: Payer: Self-pay

## 2021-10-11 ENCOUNTER — Ambulatory Visit (HOSPITAL_COMMUNITY)
Admission: RE | Admit: 2021-10-11 | Discharge: 2021-10-11 | Disposition: A | Discharge status: Home / Self Care | Payer: Medicaid Other | Source: Ambulatory Visit | Attending: Cardiology | Admitting: Cardiology

## 2021-10-11 ENCOUNTER — Other Ambulatory Visit: Payer: Medicaid Other

## 2021-10-11 DIAGNOSIS — I255 Ischemic cardiomyopathy: Secondary | ICD-10-CM | POA: Diagnosis not present

## 2021-10-11 DIAGNOSIS — I10 Essential (primary) hypertension: Secondary | ICD-10-CM | POA: Diagnosis not present

## 2021-10-11 DIAGNOSIS — I779 Disorder of arteries and arterioles, unspecified: Secondary | ICD-10-CM | POA: Diagnosis not present

## 2021-10-11 DIAGNOSIS — I5022 Chronic systolic (congestive) heart failure: Secondary | ICD-10-CM

## 2021-10-11 LAB — BASIC METABOLIC PANEL
BUN/Creatinine Ratio: 21 (ref 10–24)
BUN: 39 mg/dL — ABNORMAL HIGH (ref 8–27)
CO2: 23 mmol/L (ref 20–29)
Calcium: 9.3 mg/dL (ref 8.6–10.2)
Chloride: 105 mmol/L (ref 96–106)
Creatinine, Ser: 1.85 mg/dL — ABNORMAL HIGH (ref 0.76–1.27)
Glucose: 93 mg/dL (ref 70–99)
Potassium: 4.4 mmol/L (ref 3.5–5.2)
Sodium: 139 mmol/L (ref 134–144)
eGFR: 40 mL/min/{1.73_m2} — ABNORMAL LOW (ref >59)

## 2021-10-12 ENCOUNTER — Other Ambulatory Visit: Payer: Self-pay | Admitting: *Deleted

## 2021-10-12 DIAGNOSIS — I5022 Chronic systolic (congestive) heart failure: Secondary | ICD-10-CM

## 2021-10-16 ENCOUNTER — Ambulatory Visit (INDEPENDENT_AMBULATORY_CARE_PROVIDER_SITE_OTHER): Payer: Medicaid Other

## 2021-10-16 DIAGNOSIS — Z95810 Presence of automatic (implantable) cardiac defibrillator: Secondary | ICD-10-CM

## 2021-10-16 LAB — CUP PACEART REMOTE DEVICE CHECK
Battery Remaining Longevity: 43 mo
Battery Voltage: 2.96 V
Brady Statistic RV Percent Paced: 0.01 %
Date Time Interrogation Session: 20221114091606
HighPow Impedance: 52 Ohm
Implantable Lead Implant Date: 20170808
Implantable Lead Location: 753860
Implantable Pulse Generator Implant Date: 20170808
Lead Channel Impedance Value: 285 Ohm
Lead Channel Impedance Value: 361 Ohm
Lead Channel Pacing Threshold Amplitude: 0.625 V
Lead Channel Pacing Threshold Pulse Width: 0.4 ms
Lead Channel Sensing Intrinsic Amplitude: 13.5 mV
Lead Channel Sensing Intrinsic Amplitude: 13.5 mV
Lead Channel Setting Pacing Amplitude: 2.5 V
Lead Channel Setting Pacing Pulse Width: 0.4 ms
Lead Channel Setting Sensing Sensitivity: 0.3 mV

## 2021-10-17 DIAGNOSIS — E1021 Type 1 diabetes mellitus with diabetic nephropathy: Secondary | ICD-10-CM | POA: Diagnosis not present

## 2021-10-17 DIAGNOSIS — Z79899 Other long term (current) drug therapy: Secondary | ICD-10-CM | POA: Diagnosis not present

## 2021-10-17 DIAGNOSIS — E559 Vitamin D deficiency, unspecified: Secondary | ICD-10-CM | POA: Diagnosis not present

## 2021-10-18 DIAGNOSIS — I509 Heart failure, unspecified: Secondary | ICD-10-CM | POA: Diagnosis not present

## 2021-10-18 DIAGNOSIS — E559 Vitamin D deficiency, unspecified: Secondary | ICD-10-CM | POA: Diagnosis not present

## 2021-10-18 DIAGNOSIS — D649 Anemia, unspecified: Secondary | ICD-10-CM | POA: Diagnosis not present

## 2021-10-18 DIAGNOSIS — E785 Hyperlipidemia, unspecified: Secondary | ICD-10-CM | POA: Diagnosis not present

## 2021-10-18 DIAGNOSIS — Z1331 Encounter for screening for depression: Secondary | ICD-10-CM | POA: Diagnosis not present

## 2021-10-18 DIAGNOSIS — E1021 Type 1 diabetes mellitus with diabetic nephropathy: Secondary | ICD-10-CM | POA: Diagnosis not present

## 2021-10-18 DIAGNOSIS — Z794 Long term (current) use of insulin: Secondary | ICD-10-CM | POA: Diagnosis not present

## 2021-10-18 DIAGNOSIS — N1832 Chronic kidney disease, stage 3b: Secondary | ICD-10-CM | POA: Diagnosis not present

## 2021-10-18 DIAGNOSIS — I130 Hypertensive heart and chronic kidney disease with heart failure and stage 1 through stage 4 chronic kidney disease, or unspecified chronic kidney disease: Secondary | ICD-10-CM | POA: Diagnosis not present

## 2021-10-18 DIAGNOSIS — I2510 Atherosclerotic heart disease of native coronary artery without angina pectoris: Secondary | ICD-10-CM | POA: Diagnosis not present

## 2021-10-24 NOTE — Progress Notes (Signed)
Remote ICD transmission.   

## 2021-10-31 ENCOUNTER — Encounter: Payer: Self-pay | Admitting: Interventional Cardiology

## 2021-11-09 DIAGNOSIS — I5022 Chronic systolic (congestive) heart failure: Secondary | ICD-10-CM | POA: Diagnosis not present

## 2021-11-10 LAB — BASIC METABOLIC PANEL
BUN/Creatinine Ratio: 17 (ref 10–24)
BUN: 34 mg/dL — ABNORMAL HIGH (ref 8–27)
CO2: 20 mmol/L (ref 20–29)
Calcium: 9.5 mg/dL (ref 8.6–10.2)
Chloride: 102 mmol/L (ref 96–106)
Creatinine, Ser: 2.02 mg/dL — ABNORMAL HIGH (ref 0.76–1.27)
Glucose: 158 mg/dL — ABNORMAL HIGH (ref 70–99)
Potassium: 4.5 mmol/L (ref 3.5–5.2)
Sodium: 136 mmol/L (ref 134–144)
eGFR: 36 mL/min/{1.73_m2} — ABNORMAL LOW (ref >59)

## 2021-11-13 ENCOUNTER — Ambulatory Visit (INDEPENDENT_AMBULATORY_CARE_PROVIDER_SITE_OTHER): Payer: Medicaid Other

## 2021-11-13 DIAGNOSIS — I5022 Chronic systolic (congestive) heart failure: Secondary | ICD-10-CM | POA: Diagnosis not present

## 2021-11-13 DIAGNOSIS — Z95810 Presence of automatic (implantable) cardiac defibrillator: Secondary | ICD-10-CM

## 2021-11-17 NOTE — Progress Notes (Signed)
EPIC Encounter for ICM Monitoring  Patient Name: Travis Tran is a 65 y.o. male Date: 11/17/2021 Primary Care Physican: Osie Cheeks, MD Primary Cardiologist: Katrinka Blazing Electrophysiologist: Elberta Fortis 09/29/2021 Weight: 151 lbs                              Spoke with patient and heart failure questions reviewed.  Pt reports SOB has resolved since Dr Katrinka Blazing said he could take Torsemide 20 mg daily and extra as needed (see 11/29 my chart message).     Optivol thoracic impedance suggesting pattern of possible fluid accumulation x 2-3 days alternating with return to baseline for last month (addressed on 11/29 through [place] message).    Prescribed:  Furosemide 20 mg Take 1 tablet (20 mg total) by mouth 3 (three) times a week. Take on Mondays, Weds, and Fridays.   Labs: 08/31/2021 Creatinine 1.88, BUN 32, Potassium 4.5, Sodium 141, GFR 40 05/10/2021 Creatinine 2.13, BUN 43, Potassium 3.9, Sodium 140  GFR 34,       Care Everywhere 03/28/2021 Creatinine 1.5,   BUN 32, Potassium 4.4, Sodium 141, GFR 48.92  Care Everywhere [facility] 03/07/2021 Creatinine 1.7,   BUN 33, Potassium 4.1, Sodium 139, GFR 42.07  Care Everywhere [facility] 02/28/2021 Creatinine 1.88, BUN 37, Potassium 4.0, Sodium 139, GFR 40  A complete set of results can be found in Results Review.      Recommendations:    No changes and encouraged to call if experiencing any fluid symptoms.    Follow-up plan: ICM clinic phone appointment on 12/18/2020   91 day device clinic remote transmission 01/15/2022.     EP/Cardiology Office Visits:  02/21/2022 with Dr Katrinka Blazing.     Copy of ICM check sent to Dr. Elberta Fortis.    3 month ICM trend: 11/13/2021.    12-14 Month ICM trend:       Karie Soda, RN 11/17/2021 4:45 PM

## 2021-11-20 ENCOUNTER — Other Ambulatory Visit: Payer: Self-pay | Admitting: *Deleted

## 2021-11-20 MED ORDER — FUROSEMIDE 20 MG PO TABS: 20.0000 mg | ORAL_TABLET | Freq: Every day | ORAL | 1 refills | Status: DC

## 2021-12-18 ENCOUNTER — Ambulatory Visit (INDEPENDENT_AMBULATORY_CARE_PROVIDER_SITE_OTHER): Payer: Medicaid Other

## 2021-12-18 DIAGNOSIS — I5022 Chronic systolic (congestive) heart failure: Secondary | ICD-10-CM

## 2021-12-18 DIAGNOSIS — Z95810 Presence of automatic (implantable) cardiac defibrillator: Secondary | ICD-10-CM | POA: Diagnosis not present

## 2021-12-20 DIAGNOSIS — R801 Persistent proteinuria, unspecified: Secondary | ICD-10-CM | POA: Diagnosis not present

## 2021-12-20 DIAGNOSIS — N1831 Chronic kidney disease, stage 3a: Secondary | ICD-10-CM | POA: Diagnosis not present

## 2021-12-21 ENCOUNTER — Telehealth: Payer: Self-pay

## 2021-12-21 NOTE — Progress Notes (Signed)
EPIC Encounter for ICM Monitoring  Patient Name: Travis Tran is a 66 y.o. male Date: 12/21/2021 Primary Care Physican: Osie CheeksArcot, Narender D, MD Primary Cardiologist: Katrinka BlazingSmith Electrophysiologist: Elberta Fortisamnitz 09/29/2021 Weight: 151 lbs                              Attempted call to patient and unable to reach.  Left detailed message per DPR regarding transmission. Transmission reviewed.    Optivol thoracic impedance normal but was suggesting intermittent days with possible fluid accumulation in the past month.    Prescribed:  Furosemide 20 mg Take 1 tablet (20 mg total) by mouth 3 (three) times a week. Take on Mondays, Weds, and Fridays.  Per 11/29 my chart message, Dr Katrinka BlazingSmith approved to take Torsemide 20 mg daily and extra as needed.     Labs: 08/31/2021 Creatinine 1.88, BUN 32, Potassium 4.5, Sodium 141, GFR 40 05/10/2021 Creatinine 2.13, BUN 43, Potassium 3.9, Sodium 140  GFR 34,       Care Everywhere 03/28/2021 Creatinine 1.5,   BUN 32, Potassium 4.4, Sodium 141, GFR 48.92  Care Everywhere [facility] 03/07/2021 Creatinine 1.7,   BUN 33, Potassium 4.1, Sodium 139, GFR 42.07  Care Everywhere [facility] 02/28/2021 Creatinine 1.88, BUN 37, Potassium 4.0, Sodium 139, GFR 40  A complete set of results can be found in Results Review.      Recommendations:    Left voice mail with ICM number and encouraged to call if experiencing any fluid symptoms.    Follow-up plan: ICM clinic phone appointment on 01/22/2022.   91 day device clinic remote transmission 01/15/2022.     EP/Cardiology Office Visits:  02/21/2022 with Dr Katrinka BlazingSmith.     Copy of ICM check sent to Dr. Elberta Fortisamnitz.     3 month ICM trend: 12/18/2021.    12-14 Month ICM trend:     Karie SodaLaurie S Ernestine Rohman, RN 12/21/2021 10:49 AM

## 2021-12-21 NOTE — Telephone Encounter (Signed)
Remote ICM transmission received.  Attempted call to patient regarding ICM remote transmission and left detailed message per DPR.  Advised to return call for any fluid symptoms or questions. Next ICM remote transmission scheduled 01/22/2022.   ° °

## 2022-01-11 DIAGNOSIS — R801 Persistent proteinuria, unspecified: Secondary | ICD-10-CM | POA: Diagnosis not present

## 2022-01-11 DIAGNOSIS — I501 Left ventricular failure: Secondary | ICD-10-CM | POA: Diagnosis not present

## 2022-01-11 DIAGNOSIS — N1831 Chronic kidney disease, stage 3a: Secondary | ICD-10-CM | POA: Diagnosis not present

## 2022-01-11 DIAGNOSIS — I10 Essential (primary) hypertension: Secondary | ICD-10-CM | POA: Diagnosis not present

## 2022-01-11 DIAGNOSIS — E119 Type 2 diabetes mellitus without complications: Secondary | ICD-10-CM | POA: Diagnosis not present

## 2022-01-15 ENCOUNTER — Ambulatory Visit (INDEPENDENT_AMBULATORY_CARE_PROVIDER_SITE_OTHER): Payer: Medicaid Other

## 2022-01-15 DIAGNOSIS — I255 Ischemic cardiomyopathy: Secondary | ICD-10-CM | POA: Diagnosis not present

## 2022-01-16 LAB — CUP PACEART REMOTE DEVICE CHECK
Battery Remaining Longevity: 48 mo
Battery Voltage: 2.97 V
Brady Statistic RV Percent Paced: 0.01 %
Date Time Interrogation Session: 20230214061705
HighPow Impedance: 61 Ohm
Implantable Lead Implant Date: 20170808
Implantable Lead Location: 753860
Implantable Pulse Generator Implant Date: 20170808
Lead Channel Impedance Value: 285 Ohm
Lead Channel Impedance Value: 342 Ohm
Lead Channel Pacing Threshold Amplitude: 0.625 V
Lead Channel Pacing Threshold Pulse Width: 0.4 ms
Lead Channel Sensing Intrinsic Amplitude: 12.5 mV
Lead Channel Sensing Intrinsic Amplitude: 12.5 mV
Lead Channel Setting Pacing Amplitude: 2.5 V
Lead Channel Setting Pacing Pulse Width: 0.4 ms
Lead Channel Setting Sensing Sensitivity: 0.3 mV

## 2022-01-19 NOTE — Progress Notes (Signed)
Remote ICD transmission.   

## 2022-01-22 ENCOUNTER — Ambulatory Visit (INDEPENDENT_AMBULATORY_CARE_PROVIDER_SITE_OTHER): Payer: Medicaid Other

## 2022-01-22 DIAGNOSIS — I5022 Chronic systolic (congestive) heart failure: Secondary | ICD-10-CM

## 2022-01-22 DIAGNOSIS — Z95810 Presence of automatic (implantable) cardiac defibrillator: Secondary | ICD-10-CM | POA: Diagnosis not present

## 2022-01-23 ENCOUNTER — Telehealth: Payer: Self-pay

## 2022-01-23 NOTE — Telephone Encounter (Signed)
Remote ICM transmission received.  Attempted call to patient regarding ICM remote transmission and left detailed message per DPR.  Advised to return call for any fluid symptoms or questions. Next ICM remote transmission scheduled 02/26/2022.   ° °

## 2022-01-23 NOTE — Progress Notes (Signed)
EPIC Encounter for ICM Monitoring  Patient Name: Travis Tran is a 66 y.o. male Date: 01/23/2022 Primary Care Physican: Osie Cheeks, MD Primary Cardiologist: Katrinka Blazing Electrophysiologist: Elberta Fortis 09/29/2021 Weight: 151 lbs                              Attempted call to patient and unable to reach.  Left detailed message per DPR regarding transmission. Transmission reviewed.    Optivol thoracic impedance normal but was suggesting intermittent days with possible fluid accumulation in the past month.    Prescribed:  Furosemide 20 mg Take 1 tablet (20 mg total) by mouth 3 (three) times a week. Take on Mondays, Weds, and Fridays.  Per 11/29 my chart message, Dr Katrinka Blazing approved to take Torsemide 20 mg daily and extra as needed.     Labs:   08/31/2021 Creatinine 1.88, BUN 32, Potassium 4.5, Sodium 141, GFR 40 05/10/2021 Creatinine 2.13, BUN 43, Potassium 3.9, Sodium 140  GFR 34,       Care Everywhere A complete set of results can be found in Results Review.      Recommendations:    Left voice mail with ICM number and encouraged to call if experiencing any fluid symptoms.    Follow-up plan: ICM clinic phone appointment on 02/26/2022.   91 day device clinic remote transmission 04/16/2022.     EP/Cardiology Office Visits:  02/21/2022 with Dr Katrinka Blazing.     Copy of ICM check sent to Dr. Elberta Fortis.      3 month ICM trend: 01/23/2022.    12-14 Month ICM trend:     Karie Soda, RN 01/23/2022 3:39 PM

## 2022-01-25 DIAGNOSIS — N183 Chronic kidney disease, stage 3 unspecified: Secondary | ICD-10-CM | POA: Diagnosis not present

## 2022-01-25 DIAGNOSIS — E785 Hyperlipidemia, unspecified: Secondary | ICD-10-CM | POA: Diagnosis not present

## 2022-01-25 DIAGNOSIS — I429 Cardiomyopathy, unspecified: Secondary | ICD-10-CM | POA: Diagnosis not present

## 2022-01-25 DIAGNOSIS — E119 Type 2 diabetes mellitus without complications: Secondary | ICD-10-CM | POA: Diagnosis not present

## 2022-01-25 DIAGNOSIS — I2510 Atherosclerotic heart disease of native coronary artery without angina pectoris: Secondary | ICD-10-CM | POA: Diagnosis not present

## 2022-02-10 ENCOUNTER — Other Ambulatory Visit: Payer: Self-pay | Admitting: Interventional Cardiology
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
## 2022-02-21 ENCOUNTER — Ambulatory Visit (INDEPENDENT_AMBULATORY_CARE_PROVIDER_SITE_OTHER): Payer: Medicaid Other | Admitting: Interventional Cardiology

## 2022-02-21 ENCOUNTER — Encounter: Payer: Self-pay | Admitting: Interventional Cardiology

## 2022-02-21 ENCOUNTER — Other Ambulatory Visit: Payer: Self-pay

## 2022-02-21 VITALS — BP 112/62 | HR 81 | Ht 66.0 in | Wt 158.6 lb

## 2022-02-21 DIAGNOSIS — Z95810 Presence of automatic (implantable) cardiac defibrillator: Secondary | ICD-10-CM

## 2022-02-21 DIAGNOSIS — I779 Disorder of arteries and arterioles, unspecified: Secondary | ICD-10-CM

## 2022-02-21 DIAGNOSIS — E1152 Type 2 diabetes mellitus with diabetic peripheral angiopathy with gangrene: Secondary | ICD-10-CM | POA: Diagnosis not present

## 2022-02-21 DIAGNOSIS — Q240 Dextrocardia: Secondary | ICD-10-CM

## 2022-02-21 DIAGNOSIS — I5022 Chronic systolic (congestive) heart failure: Secondary | ICD-10-CM | POA: Diagnosis not present

## 2022-02-21 DIAGNOSIS — I480 Paroxysmal atrial fibrillation: Secondary | ICD-10-CM

## 2022-02-21 DIAGNOSIS — I25810 Atherosclerosis of coronary artery bypass graft(s) without angina pectoris: Secondary | ICD-10-CM | POA: Diagnosis not present

## 2022-02-21 DIAGNOSIS — I10 Essential (primary) hypertension: Secondary | ICD-10-CM | POA: Diagnosis not present

## 2022-02-21 NOTE — Patient Instructions (Signed)
Medication Instructions:  ?Your physician recommends that you continue on your current medications as directed. Please refer to the Current Medication list given to you today. ? ?*If you need a refill on your cardiac medications before your next appointment, please call your pharmacy* ? ? ?Lab Work: ?None ?If you have labs (blood work) drawn today and your tests are completely normal, you will receive your results only by: ?MyChart Message (if you have MyChart) OR ?A paper copy in the mail ?If you have any lab test that is abnormal or we need to change your treatment, we will call you to review the results. ? ? ?Testing/Procedures: ?None ? ? ?Follow-Up: ?At [facility], you and your health needs are our priority.  As part of our continuing mission to provide you with exceptional heart care, we have created designated Provider Care Teams.  These Care Teams include your primary Cardiologist (physician) and Advanced Practice Providers (APPs -  Physician Assistants and Nurse Practitioners) who all work together to provide you with the care you need, when you need it. ? ?We recommend signing up for the patient portal called "MyChart".  Sign up information is provided on this After Visit Summary.  MyChart is used to connect with patients for Virtual Visits (Telemedicine).  Patients are able to view lab/test results, encounter notes, upcoming appointments, etc.  Non-urgent messages can be sent to your provider as well.   ?To learn more about what you can do with MyChart, go to [email or web address]://[email or web address].   ? ?Your next appointment:   ?6 month(s) ? ?The format for your next appointment:   ?In Person ? ?Provider:   ?Henry W Smith III, MD  ? ? ?Other Instructions ?  ?

## 2022-02-26 ENCOUNTER — Ambulatory Visit (INDEPENDENT_AMBULATORY_CARE_PROVIDER_SITE_OTHER): Payer: Medicaid Other

## 2022-02-26 DIAGNOSIS — Z95810 Presence of automatic (implantable) cardiac defibrillator: Secondary | ICD-10-CM | POA: Diagnosis not present

## 2022-02-26 DIAGNOSIS — N1831 Chronic kidney disease, stage 3a: Secondary | ICD-10-CM | POA: Diagnosis not present

## 2022-02-26 DIAGNOSIS — I5022 Chronic systolic (congestive) heart failure: Secondary | ICD-10-CM | POA: Diagnosis not present

## 2022-02-28 ENCOUNTER — Telehealth: Payer: Self-pay

## 2022-02-28 NOTE — Telephone Encounter (Signed)
Remote ICM transmission received.  Attempted call to patient regarding ICM remote transmission and left detailed message per DPR.  Advised to return call for any fluid symptoms or questions. Next ICM remote transmission scheduled 04/02/2022.   ? ?

## 2022-02-28 NOTE — Progress Notes (Signed)
EPIC Encounter for ICM Monitoring ? ?Patient Name: Travis FieldsDonald G Emberson is a 66 y.o. male ?Date: 02/28/2022 ?Primary Care Physican: Pcp, No ?Primary Cardiologist: Katrinka BlazingSmith ?Electrophysiologist: Camnitz ?02/21/2022 Office Weight: 158 lbs                            ?  ?Attempted call to patient and unable to reach.  Left detailed message per DPR regarding transmission. Transmission reviewed.  ?  ?Optivol thoracic impedance normal.  ?  ?Prescribed:  ?Furosemide 20 mg Take 1 tablet (20 mg total) by mouth daily   ?  ?Labs: ?08/31/2021 Creatinine 1.88, BUN 32, Potassium 4.5, Sodium 141, GFR 40 ?05/10/2021 Creatinine 2.13, BUN 43, Potassium 3.9, Sodium 140  GFR 34,       Care Everywhere ?A complete set of results can be found in Results Review. ?     ?Recommendations:    Left voice mail with ICM number and encouraged to call if experiencing any fluid symptoms. ?   ?Follow-up plan: ICM clinic phone appointment on 04/02/2022.   91 day device clinic remote transmission 04/16/2022.   ?  ?EP/Cardiology Office Visits:  08/20/2022 with Dr Katrinka BlazingSmith and Dr Elberta Fortisamnitz.   ?  ?Copy of ICM check sent to Dr. Elberta Fortisamnitz.     ? ?3 month ICM trend: 02/26/2022. ? ? ? ?12-14 Month ICM trend:  ? ? ? ?Karie SodaLaurie S Kaytlynne Neace, RN ?02/28/2022 ?3:33 PM ? ?

## 2022-03-31 ENCOUNTER — Other Ambulatory Visit: Payer: Self-pay | Admitting: Interventional Cardiology

## 2022-04-02 ENCOUNTER — Other Ambulatory Visit: Payer: Self-pay | Admitting: *Deleted

## 2022-04-02 ENCOUNTER — Ambulatory Visit (INDEPENDENT_AMBULATORY_CARE_PROVIDER_SITE_OTHER): Payer: Medicaid Other

## 2022-04-02 ENCOUNTER — Encounter: Payer: Self-pay | Admitting: Interventional Cardiology

## 2022-04-02 DIAGNOSIS — I5022 Chronic systolic (congestive) heart failure: Secondary | ICD-10-CM | POA: Diagnosis not present

## 2022-04-02 DIAGNOSIS — Z95810 Presence of automatic (implantable) cardiac defibrillator: Secondary | ICD-10-CM

## 2022-04-02 MED ORDER — ATORVASTATIN CALCIUM 80 MG PO TABS: ORAL_TABLET | ORAL | 3 refills | Status: DC

## 2022-04-03 ENCOUNTER — Other Ambulatory Visit: Payer: Self-pay | Admitting: Interventional Cardiology

## 2022-04-03 DIAGNOSIS — N1831 Chronic kidney disease, stage 3a: Secondary | ICD-10-CM | POA: Diagnosis not present

## 2022-04-03 MED ORDER — CARVEDILOL 6.25 MG PO TABS: ORAL_TABLET | ORAL | 3 refills | Status: DC

## 2022-04-03 NOTE — Progress Notes (Signed)
Refill of carvedilol sent to [facility]. ?

## 2022-04-04 ENCOUNTER — Telehealth: Payer: Self-pay

## 2022-04-04 NOTE — Progress Notes (Signed)
EPIC Encounter for ICM Monitoring ? ?Patient Name: Travis Tran is a 66 y.o. male ?Date: 04/04/2022 ?Primary Care Physican: Pcp, No ?Primary Cardiologist: Katrinka Blazing ?Electrophysiologist: Camnitz ?02/21/2022 Office Weight: 158 lbs                            ?  ?Attempted call to patient and unable to reach.  Left detailed message per DPR regarding transmission. Transmission reviewed.  ?  ?Optivol thoracic impedance normal.  ?  ?Prescribed:  ?Furosemide 20 mg Take 1 tablet (20 mg total) by mouth daily   ?  ?Labs: ?11/09/2021 Creatinine 2.02, BUN 34, Potassium 4.5, Sodium 136, GFR 36 ?10/11/2021 Creatinine 1.85, BUN 39, Potassium 4.4, Sodium 139, GFR 40 ?08/31/2021 Creatinine 1.88, BUN 32, Potassium 4.5, Sodium 141, GFR 40 ?A complete set of results can be found in Results Review. ?     ?Recommendations:    Left voice mail with ICM number and encouraged to call if experiencing any fluid symptoms. ?   ?Follow-up plan: ICM clinic phone appointment on 05/07/2022.   91 day device clinic remote transmission 04/16/2022.   ?  ?EP/Cardiology Office Visits:  08/20/2022 with Dr Katrinka Blazing and Dr Elberta Fortis.   ?  ?Copy of ICM check sent to Dr. Elberta Fortis.  ? ?3 month ICM trend: 04/02/2022. ? ? ? ?12-14 Month ICM trend:  ? ? ? ?Karie Soda, RN ?04/04/2022 ?4:01 PM ? ?

## 2022-04-04 NOTE — Telephone Encounter (Signed)
Remote ICM transmission received.  Attempted call to patient regarding ICM remote transmission and left detailed message per DPR.  Advised to return call for any fluid symptoms or questions. Next ICM remote transmission scheduled 05/07/2022.   ? ?

## 2022-04-05 DIAGNOSIS — E1021 Type 1 diabetes mellitus with diabetic nephropathy: Secondary | ICD-10-CM | POA: Diagnosis not present

## 2022-04-06 NOTE — Progress Notes (Signed)
Spoke with patient and heart failure questions reviewed.  Pt asymptomatic for fluid accumulation.  Reports feeling well at this time and voices no complaints.  Transmission reviewed.  No changes and encouraged to call if experiencing any fluid symptoms. 

## 2022-04-09 DIAGNOSIS — N183 Chronic kidney disease, stage 3 unspecified: Secondary | ICD-10-CM | POA: Diagnosis not present

## 2022-04-09 DIAGNOSIS — Z794 Long term (current) use of insulin: Secondary | ICD-10-CM | POA: Diagnosis not present

## 2022-04-09 DIAGNOSIS — Z7984 Long term (current) use of oral hypoglycemic drugs: Secondary | ICD-10-CM | POA: Diagnosis not present

## 2022-04-09 DIAGNOSIS — E785 Hyperlipidemia, unspecified: Secondary | ICD-10-CM | POA: Diagnosis not present

## 2022-04-09 DIAGNOSIS — I1310 Hypertensive heart and chronic kidney disease without heart failure, with stage 1 through stage 4 chronic kidney disease, or unspecified chronic kidney disease: Secondary | ICD-10-CM | POA: Diagnosis not present

## 2022-04-09 DIAGNOSIS — E1122 Type 2 diabetes mellitus with diabetic chronic kidney disease: Secondary | ICD-10-CM | POA: Diagnosis not present

## 2022-04-10 DIAGNOSIS — N184 Chronic kidney disease, stage 4 (severe): Secondary | ICD-10-CM | POA: Diagnosis not present

## 2022-04-10 DIAGNOSIS — I5020 Unspecified systolic (congestive) heart failure: Secondary | ICD-10-CM | POA: Diagnosis not present

## 2022-04-10 DIAGNOSIS — I129 Hypertensive chronic kidney disease with stage 1 through stage 4 chronic kidney disease, or unspecified chronic kidney disease: Secondary | ICD-10-CM | POA: Diagnosis not present

## 2022-04-10 DIAGNOSIS — E1122 Type 2 diabetes mellitus with diabetic chronic kidney disease: Secondary | ICD-10-CM | POA: Diagnosis not present

## 2022-04-10 DIAGNOSIS — R801 Persistent proteinuria, unspecified: Secondary | ICD-10-CM | POA: Diagnosis not present

## 2022-04-10 DIAGNOSIS — Q240 Dextrocardia: Secondary | ICD-10-CM | POA: Diagnosis not present

## 2022-04-16 ENCOUNTER — Ambulatory Visit (INDEPENDENT_AMBULATORY_CARE_PROVIDER_SITE_OTHER): Payer: Medicaid Other

## 2022-04-16 DIAGNOSIS — I255 Ischemic cardiomyopathy: Secondary | ICD-10-CM

## 2022-04-17 LAB — CUP PACEART REMOTE DEVICE CHECK
Battery Remaining Longevity: 41 mo
Battery Voltage: 2.98 V
Brady Statistic RV Percent Paced: 0.01 %
Date Time Interrogation Session: 20230515033524
HighPow Impedance: 63 Ohm
Implantable Lead Implant Date: 20170808
Implantable Lead Location: 753860
Implantable Pulse Generator Implant Date: 20170808
Lead Channel Impedance Value: 247 Ohm
Lead Channel Impedance Value: 361 Ohm
Lead Channel Pacing Threshold Amplitude: 0.625 V
Lead Channel Pacing Threshold Pulse Width: 0.4 ms
Lead Channel Sensing Intrinsic Amplitude: 13.125 mV
Lead Channel Sensing Intrinsic Amplitude: 13.125 mV
Lead Channel Setting Pacing Amplitude: 2.5 V
Lead Channel Setting Pacing Pulse Width: 0.4 ms
Lead Channel Setting Sensing Sensitivity: 0.3 mV

## 2022-05-04 NOTE — Progress Notes (Signed)
Remote ICD transmission.   

## 2022-05-07 ENCOUNTER — Ambulatory Visit (INDEPENDENT_AMBULATORY_CARE_PROVIDER_SITE_OTHER): Payer: Medicaid Other

## 2022-05-07 DIAGNOSIS — Z95810 Presence of automatic (implantable) cardiac defibrillator: Secondary | ICD-10-CM

## 2022-05-07 DIAGNOSIS — I5022 Chronic systolic (congestive) heart failure: Secondary | ICD-10-CM | POA: Diagnosis not present

## 2022-05-09 ENCOUNTER — Telehealth: Payer: Self-pay

## 2022-05-09 NOTE — Telephone Encounter (Signed)
Remote ICM transmission received.  Attempted call to patient regarding ICM remote transmission and left detailed message per DPR.  Advised to return call for any fluid symptoms or questions. Next ICM remote transmission scheduled 06/11/2022.    

## 2022-05-09 NOTE — Progress Notes (Signed)
EPIC Encounter for ICM Monitoring  Patient Name: Travis Tran is a 66 y.o. male Date: 05/09/2022 Primary Care Physican: Pcp, No Primary Cardiologist: Katrinka Blazing Electrophysiologist: Elberta Fortis 02/21/2022 Office Weight: 158 lbs                              Attempted call to patient and unable to reach.  Left detailed message per DPR regarding transmission. Transmission reviewed.    Optivol thoracic impedance normal.    Prescribed:  Furosemide 20 mg Take 1 tablet (20 mg total) by mouth daily     Labs: 11/09/2021 Creatinine 2.02, BUN 34, Potassium 4.5, Sodium 136, GFR 36 10/11/2021 Creatinine 1.85, BUN 39, Potassium 4.4, Sodium 139, GFR 40 08/31/2021 Creatinine 1.88, BUN 32, Potassium 4.5, Sodium 141, GFR 40 A complete set of results can be found in Results Review.      Recommendations:    Left voice mail with ICM number and encouraged to call if experiencing any fluid symptoms.    Follow-up plan: ICM clinic phone appointment on 06/11/2022.   91 day device clinic remote transmission 07/16/2022.     EP/Cardiology Office Visits:  08/20/2022 with Dr Katrinka Blazing and Dr Elberta Fortis.     Copy of ICM check sent to Dr. Elberta Fortis.   3 month ICM trend: 05/07/2022.    12-14 Month ICM trend:     Karie Soda, RN 05/09/2022 4:38 PM

## 2022-05-14 ENCOUNTER — Other Ambulatory Visit: Payer: Self-pay

## 2022-05-14 MED ORDER — FUROSEMIDE 20 MG PO TABS: 20.0000 mg | ORAL_TABLET | Freq: Every day | ORAL | 3 refills | Status: DC

## 2022-06-11 ENCOUNTER — Telehealth: Payer: Self-pay

## 2022-06-11 ENCOUNTER — Ambulatory Visit (INDEPENDENT_AMBULATORY_CARE_PROVIDER_SITE_OTHER): Payer: Medicaid Other

## 2022-06-11 DIAGNOSIS — I5022 Chronic systolic (congestive) heart failure: Secondary | ICD-10-CM

## 2022-06-11 DIAGNOSIS — Z95810 Presence of automatic (implantable) cardiac defibrillator: Secondary | ICD-10-CM

## 2022-06-11 NOTE — Progress Notes (Signed)
EPIC Encounter for ICM Monitoring  Patient Name: Travis Tran is a 66 y.o. male Date: 06/11/2022 Primary Care Physican: Pcp, No Primary Cardiologist: Katrinka Blazing Electrophysiologist: Elberta Fortis 02/21/2022 Office Weight: 158 lbs                              Attempted call to patient and unable to reach.  Left detailed message per DPR regarding transmission. Transmission reviewed.    Optivol thoracic impedance suggesting possible fluid accumulation starting 7/6 but trending back toward baseline.     Prescribed:  Furosemide 20 mg Take 1 tablet (20 mg total) by mouth daily     Labs: 11/09/2021 Creatinine 2.02, BUN 34, Potassium 4.5, Sodium 136, GFR 36 10/11/2021 Creatinine 1.85, BUN 39, Potassium 4.4, Sodium 139, GFR 40 08/31/2021 Creatinine 1.88, BUN 32, Potassium 4.5, Sodium 141, GFR 40 A complete set of results can be found in Results Review.      Recommendations:    Left voice mail with ICM number and encouraged to call if experiencing any fluid symptoms.    Follow-up plan: ICM clinic phone appointment on 07/17/2022.   91 day device clinic remote transmission 07/16/2022.     EP/Cardiology Office Visits:  08/20/2022 with Dr Katrinka Blazing and Dr Elberta Fortis.     Copy of ICM check sent to Dr. Elberta Fortis.   3 month ICM trend: 06/11/2022.    12-14 Month ICM trend:     Karie Soda, RN 06/11/2022 2:59 PM

## 2022-06-11 NOTE — Telephone Encounter (Signed)
Remote ICM transmission received.  Attempted call to patient regarding ICM remote transmission and left detailed message per DPR.  Advised to return call for any fluid symptoms or questions. Next ICM remote transmission scheduled 07/17/2022.

## 2022-07-16 ENCOUNTER — Ambulatory Visit: Payer: Medicaid Other

## 2022-07-17 ENCOUNTER — Ambulatory Visit (INDEPENDENT_AMBULATORY_CARE_PROVIDER_SITE_OTHER): Payer: Medicaid Other

## 2022-07-17 DIAGNOSIS — Z95810 Presence of automatic (implantable) cardiac defibrillator: Secondary | ICD-10-CM

## 2022-07-17 DIAGNOSIS — I5022 Chronic systolic (congestive) heart failure: Secondary | ICD-10-CM | POA: Diagnosis not present

## 2022-07-17 LAB — CUP PACEART REMOTE DEVICE CHECK
Battery Remaining Longevity: 39 mo
Battery Voltage: 2.97 V
Brady Statistic RV Percent Paced: 0.01 %
Date Time Interrogation Session: 20230815033324
HighPow Impedance: 70 Ohm
Implantable Lead Implant Date: 20170808
Implantable Lead Location: 753860
Implantable Pulse Generator Implant Date: 20170808
Lead Channel Impedance Value: 285 Ohm
Lead Channel Impedance Value: 361 Ohm
Lead Channel Pacing Threshold Amplitude: 0.5 V
Lead Channel Pacing Threshold Pulse Width: 0.4 ms
Lead Channel Sensing Intrinsic Amplitude: 15.375 mV
Lead Channel Sensing Intrinsic Amplitude: 15.375 mV
Lead Channel Setting Pacing Amplitude: 2.5 V
Lead Channel Setting Pacing Pulse Width: 0.4 ms
Lead Channel Setting Sensing Sensitivity: 0.3 mV

## 2022-07-17 NOTE — Progress Notes (Unsigned)
EPIC Encounter for ICM Monitoring  Patient Name: Travis Tran is a 66 y.o. male Date: 07/17/2022 Primary Care Physican: Pcp, No Primary Cardiologist: Katrinka Blazing Electrophysiologist: Elberta Fortis 02/21/2022 Office Weight: 158 lbs   152 lbs                            Spoke with patient and heart failure questions reviewed.  Pt asymptomatic for fluid accumulation.  Reports feeling well at this time and voices no complaints.    Optivol thoracic impedance suggesting possible fluid accumulation starting 7/6 but trending back toward baseline.     Prescribed:  Furosemide 20 mg Take 1 tablet (20 mg total) by mouth daily     Labs: 05/29/2022 Creatinine 2.77, BUN 41, Potassium 4.1, Sodium 138, GFR 25 A complete set of results can be found in Results Review.      Recommendations:   No changes and encouraged to call if experiencing any fluid symptoms.    Follow-up plan: ICM clinic phone appointment on 08/27/2022.   91 day device clinic remote transmission 10/15/2022.     EP/Cardiology Office Visits:  08/20/2022 with Dr Katrinka Blazing and Dr Elberta Fortis.     Copy of ICM check sent to Dr. Elberta Fortis.   3 month ICM trend: 07/17/2022.    12-14 Month ICM trend:     Karie Soda, RN 07/17/2022 8:07 AM
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
## 2022-08-20 ENCOUNTER — Encounter: Payer: Self-pay | Admitting: Interventional Cardiology

## 2022-08-20 ENCOUNTER — Ambulatory Visit: Payer: Medicaid Other | Attending: Interventional Cardiology | Admitting: Interventional Cardiology

## 2022-08-20 ENCOUNTER — Ambulatory Visit (INDEPENDENT_AMBULATORY_CARE_PROVIDER_SITE_OTHER): Payer: Medicaid Other | Admitting: Cardiology

## 2022-08-20 ENCOUNTER — Encounter: Payer: Self-pay | Admitting: Cardiology

## 2022-08-20 VITALS — BP 142/88 | HR 82 | Ht 66.0 in | Wt 157.2 lb

## 2022-08-20 VITALS — BP 118/76 | HR 82 | Ht 66.0 in | Wt 157.0 lb

## 2022-08-20 DIAGNOSIS — I10 Essential (primary) hypertension: Secondary | ICD-10-CM

## 2022-08-20 DIAGNOSIS — I480 Paroxysmal atrial fibrillation: Secondary | ICD-10-CM

## 2022-08-20 DIAGNOSIS — Z95810 Presence of automatic (implantable) cardiac defibrillator: Secondary | ICD-10-CM

## 2022-08-20 DIAGNOSIS — Q240 Dextrocardia: Secondary | ICD-10-CM

## 2022-08-20 DIAGNOSIS — I5022 Chronic systolic (congestive) heart failure: Secondary | ICD-10-CM

## 2022-08-20 DIAGNOSIS — I779 Disorder of arteries and arterioles, unspecified: Secondary | ICD-10-CM | POA: Diagnosis not present

## 2022-08-20 DIAGNOSIS — E1152 Type 2 diabetes mellitus with diabetic peripheral angiopathy with gangrene: Secondary | ICD-10-CM

## 2022-08-20 DIAGNOSIS — I25810 Atherosclerosis of coronary artery bypass graft(s) without angina pectoris: Secondary | ICD-10-CM

## 2022-08-20 LAB — CUP PACEART INCLINIC DEVICE CHECK
Brady Statistic RV Percent Paced: 0.1 % — CL
Date Time Interrogation Session: 20230918194742
Implantable Lead Implant Date: 20170808
Implantable Lead Location: 753860
Implantable Pulse Generator Implant Date: 20170808
Lead Channel Pacing Threshold Amplitude: 0.75 V
Lead Channel Pacing Threshold Pulse Width: 0.4 ms
Lead Channel Sensing Intrinsic Amplitude: 16.5 mV
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
## 2022-08-20 NOTE — Patient Instructions (Signed)
Medication Instructions:  Your physician recommends that you continue on your current medications as directed. Please refer to the Current Medication list given to you today.  *If you need a refill on your cardiac medications before your next appointment, please call your pharmacy*   Lab Work: None ordered   Testing/Procedures: None ordered   Follow-Up: At [facility], you and your health needs are our priority.  As part of our continuing mission to provide you with exceptional heart care, we have created designated Provider Care Teams.  These Care Teams include your primary Cardiologist (physician) and Advanced Practice Providers (APPs -  Physician Assistants and Nurse Practitioners) who all work together to provide you with the care you need, when you need it.   Remote monitoring is used to monitor your Pacemaker or ICD from home. This monitoring reduces the number of office visits required to check your device to one time per year. It allows [place] to keep an eye on the functioning of your device to ensure it is working properly. You are scheduled for a device check from home on 08/27/2022. You may send your transmission at any time that day. If you have a wireless device, the transmission will be sent automatically. After your physician reviews your transmission, you will receive a postcard with your next transmission date.  Your next appointment:   1 year(s)  The format for your next appointment:   In Person  Provider:   Allegra Lai, MD    Thank you for choosing [facility]!!   Trinidad Curet, RN [phone]    Other Instructions   Important Information About Sugar

## 2022-08-20 NOTE — Patient Instructions (Signed)
Medication Instructions:  Your physician recommends that you continue on your current medications as directed. Please refer to the Current Medication list given to you today.  *If you need a refill on your cardiac medications before your next appointment, please call your pharmacy*  Lab Work: NONE  Testing/Procedures: NONE  Follow-Up: At [facility], you and your health needs are our priority.  As part of our continuing mission to provide you with exceptional heart care, we have created designated Provider Care Teams.  These Care Teams include your primary Cardiologist (physician) and Advanced Practice Providers (APPs -  Physician Assistants and Nurse Practitioners) who all work together to provide you with the care you need, when you need it.  Your next appointment:   6 month(s)  The format for your next appointment:   In Person  Provider:   Henry W Smith III, MD   Important Information About Sugar       

## 2022-08-27 ENCOUNTER — Ambulatory Visit (INDEPENDENT_AMBULATORY_CARE_PROVIDER_SITE_OTHER): Payer: Medicaid Other

## 2022-08-27 DIAGNOSIS — I5022 Chronic systolic (congestive) heart failure: Secondary | ICD-10-CM

## 2022-08-27 DIAGNOSIS — Z95810 Presence of automatic (implantable) cardiac defibrillator: Secondary | ICD-10-CM | POA: Diagnosis not present

## 2022-08-30 ENCOUNTER — Telehealth: Payer: Self-pay

## 2022-08-30 NOTE — Progress Notes (Signed)
EPIC Encounter for ICM Monitoring  Patient Name: Travis Tran is a 66 y.o. male Date: 08/30/2022 Primary Care Physican: Pcp, No Primary Cardiologist: Tamala Julian Electrophysiologist: Curt Bears 02/21/2022 Office Weight: 158 lbs   07/17/2022 Weight: 152 lbs                            Attempted call to patient and unable to reach.  Left detailed message per DPR regarding transmission. Transmission reviewed.    Optivol thoracic impedance suggesting normal fluid levels.     Prescribed:  Furosemide 20 mg Take 1 tablet (20 mg total) by mouth daily     Labs: 05/29/2022 Creatinine 2.77, BUN 41, Potassium 4.1, Sodium 138, GFR 25 A complete set of results can be found in Results Review.      Recommendations:   Left voice mail with ICM number and encouraged to call if experiencing any fluid symptoms.    Follow-up plan: ICM clinic phone appointment on 10/01/2022.   91 day device clinic remote transmission 10/15/2022.     EP/Cardiology Office Visits:  Recall 02/16/2023 with Dr Tamala Julian.  Recall 08/15/2023 with Dr Curt Bears.     Copy of ICM check sent to Dr. Curt Bears.   3 month ICM trend: 08/27/2022.    12-14 Month ICM trend:     Rosalene Billings, RN 08/30/2022 3:41 PM

## 2022-08-30 NOTE — Telephone Encounter (Signed)
Remote ICM transmission received.  Attempted call to patient regarding ICM remote transmission and left detailed message per DPR.  Advised to return call for any fluid symptoms or questions. Next ICM remote transmission scheduled 10/01/2022.    

## 2022-09-09 ENCOUNTER — Other Ambulatory Visit: Payer: Self-pay | Admitting: Cardiology

## 2022-10-01 ENCOUNTER — Ambulatory Visit (INDEPENDENT_AMBULATORY_CARE_PROVIDER_SITE_OTHER): Payer: Medicaid Other

## 2022-10-01 DIAGNOSIS — I5022 Chronic systolic (congestive) heart failure: Secondary | ICD-10-CM | POA: Diagnosis not present

## 2022-10-01 DIAGNOSIS — Z95810 Presence of automatic (implantable) cardiac defibrillator: Secondary | ICD-10-CM | POA: Diagnosis not present

## 2022-10-02 NOTE — Progress Notes (Signed)
EPIC Encounter for ICM Monitoring  Patient Name: Travis Tran is a 66 y.o. male Date: 10/02/2022 Primary Care Physican: Pcp, No Primary Cardiologist: Tamala Julian Electrophysiologist: Curt Bears 02/21/2022 Office Weight: 158 lbs   07/17/2022 Weight: 152 lbs      10/02/2022 Weight: 145 lbs                       Spoke with patient and heart failure questions reviewed.  Transmission results reviewed.  Pt asymptomatic for fluid accumulation.  Reports having a chest cold for past week and ate soup for the last week which may have contributed to accumulation.   Optivol thoracic impedance suggesting possible fluid accumulation starting 10/24.     Prescribed:  Furosemide 20 mg Take 1 tablet (20 mg total) by mouth daily     Labs: 06/08/2022 Creatinine 1.9, BUN 31, Potassium 4.4, Sodium 140 Care Everywhere 05/29/2022 Creatinine 2.77, BUN 41, Potassium 4.1, Sodium 138, GFR 25 A complete set of results can be found in Results Review.      Recommendations:   He will self adjust Furosemide x 2 days and try to limit fluid/salt intake.      Follow-up plan: ICM clinic phone appointment on 10/15/2022 to recheck fluid levels.   91 day device clinic remote transmission 10/15/2022.     EP/Cardiology Office Visits:  Recall 02/16/2023 with Dr Tamala Julian.  Recall 08/15/2023 with Dr Curt Bears.     Copy of ICM check sent to Dr. Curt Bears and Dr Tamala Julian as [name].   3 month ICM trend: 10/01/2022.    12-14 Month ICM trend:     Rosalene Billings, RN 10/02/2022 8:27 AM

## 2022-10-15 ENCOUNTER — Ambulatory Visit (INDEPENDENT_AMBULATORY_CARE_PROVIDER_SITE_OTHER): Payer: Medicaid Other

## 2022-10-15 DIAGNOSIS — I5022 Chronic systolic (congestive) heart failure: Secondary | ICD-10-CM

## 2022-10-16 ENCOUNTER — Ambulatory Visit (INDEPENDENT_AMBULATORY_CARE_PROVIDER_SITE_OTHER): Payer: Medicaid Other

## 2022-10-16 DIAGNOSIS — Z95810 Presence of automatic (implantable) cardiac defibrillator: Secondary | ICD-10-CM

## 2022-10-16 DIAGNOSIS — I5022 Chronic systolic (congestive) heart failure: Secondary | ICD-10-CM

## 2022-10-16 LAB — CUP PACEART REMOTE DEVICE CHECK
Battery Remaining Longevity: 42 mo
Battery Voltage: 2.96 V
Brady Statistic RV Percent Paced: 0 %
Date Time Interrogation Session: 20231114165548
HighPow Impedance: 72 Ohm
Implantable Lead Connection Status: 753985
Implantable Lead Implant Date: 20170808
Implantable Lead Location: 753860
Implantable Pulse Generator Implant Date: 20170808
Lead Channel Impedance Value: 285 Ohm
Lead Channel Impedance Value: 399 Ohm
Lead Channel Pacing Threshold Amplitude: 0.5 V
Lead Channel Pacing Threshold Pulse Width: 0.4 ms
Lead Channel Sensing Intrinsic Amplitude: 14.125 mV
Lead Channel Sensing Intrinsic Amplitude: 14.125 mV
Lead Channel Setting Pacing Amplitude: 2.5 V
Lead Channel Setting Pacing Pulse Width: 0.4 ms
Lead Channel Setting Sensing Sensitivity: 0.3 mV
Zone Setting Status: 755011
Zone Setting Status: 755011

## 2022-10-17 NOTE — Progress Notes (Signed)
EPIC Encounter for ICM Monitoring  Patient Name: SELBY FOISY is a 66 y.o. male Date: 10/17/2022 Primary Care Physican: Pcp, No Primary Cardiologist: Katrinka Blazing Electrophysiologist: Elberta Fortis 02/21/2022 Office Weight: 158 lbs   07/17/2022 Weight: 152 lbs      10/02/2022 Weight: 145 lbs                       Spoke with patient and heart failure questions reviewed.  Transmission results reviewed.  Pt asymptomatic for fluid accumulation.    Optivol thoracic impedance suggesting fluid levels returned close to normal.     Prescribed:  Furosemide 20 mg Take 1 tablet (20 mg total) by mouth daily     Labs: 06/08/2022 Creatinine 1.9, BUN 31, Potassium 4.4, Sodium 140 Care Everywhere 05/29/2022 Creatinine 2.77, BUN 41, Potassium 4.1, Sodium 138, GFR 25 A complete set of results can be found in Results Review.      Recommendations:   Recommendation to limit salt intake to 2000 mg daily and fluid intake to 64 oz daily.  Encouraged to call if experiencing any fluid symptoms.     Follow-up plan: ICM clinic phone appointment on 11/05/2022.   91 day device clinic remote transmission 01/14/2023.     EP/Cardiology Office Visits:  Recall 02/16/2023 with Dr Katrinka Blazing.  Recall 08/15/2023 with Dr Elberta Fortis.     Copy of ICM check sent to Dr. Elberta Fortis.   3 month ICM trend: 10/16/2022.    12-14 Month ICM trend:     Karie Soda, RN 10/17/2022 4:14 PM

## 2022-11-05 ENCOUNTER — Ambulatory Visit (INDEPENDENT_AMBULATORY_CARE_PROVIDER_SITE_OTHER): Payer: Medicaid Other

## 2022-11-05 DIAGNOSIS — I5022 Chronic systolic (congestive) heart failure: Secondary | ICD-10-CM | POA: Diagnosis not present

## 2022-11-05 DIAGNOSIS — Z95810 Presence of automatic (implantable) cardiac defibrillator: Secondary | ICD-10-CM

## 2022-11-05 NOTE — Progress Notes (Unsigned)
EPIC Encounter for ICM Monitoring  Patient Name: Travis Tran is a 65 y.o. male Date: 11/05/2022 Primary Care Physican: Pcp, No Primary Cardiologist: Katrinka Blazing Electrophysiologist: Elberta Fortis 02/21/2022 Office Weight: 158 lbs   07/17/2022 Weight: 152 lbs      10/02/2022 Weight: 145 lbs                       Spoke with patient and heart failure questions reviewed.  Transmission results reviewed.  His right foot has been swollen for past few days.  Holiday foods may have contributed to decreased impedance.    Optivol thoracic impedance suggesting possible fluid accumulation starting 11/24.     Prescribed:  Furosemide 20 mg Take 1 tablet (20 mg total) by mouth daily     Labs: 06/08/2022 Creatinine 1.9, BUN 31, Potassium 4.4, Sodium 140 Care Everywhere 05/29/2022 Creatinine 2.77, BUN 41, Potassium 4.1, Sodium 138, GFR 25 A complete set of results can be found in Results Review.      Recommendations:  Recommendation to limit salt intake to 2000 mg daily.  Encouraged to call if experiencing any fluid symptoms.     Follow-up plan: ICM clinic phone appointment on 11/12/2022 (manual) to recheck fluid levels.   91 day device clinic remote transmission 01/14/2023.     EP/Cardiology Office Visits:  Recall 02/16/2023 with Dr Katrinka Blazing.  Recall 08/15/2023 with Dr Elberta Fortis.     Copy of ICM check sent to Dr. Elberta Fortis and Dr Katrinka Blazing as Lorain Childes.    3 month ICM trend: 11/05/2022.    12-14 Month ICM trend:     Karie Soda, RN 11/05/2022 10:44 AM

## 2022-11-14 ENCOUNTER — Telehealth: Payer: Self-pay

## 2022-11-14 NOTE — Telephone Encounter (Signed)
Attempted ICM Call to patient and left message requesting a manual remote transmission to recheck fluid levels.

## 2022-11-14 NOTE — Telephone Encounter (Signed)
No ICM remote transmission received for 11/02/2022 and next ICM transmission scheduled for 12/24/2022.

## 2022-11-28 NOTE — Progress Notes (Signed)
Remote ICD transmission.   

## 2022-12-24 ENCOUNTER — Ambulatory Visit: Payer: Medicaid Other | Attending: Cardiology

## 2022-12-24 DIAGNOSIS — I5022 Chronic systolic (congestive) heart failure: Secondary | ICD-10-CM | POA: Diagnosis not present

## 2022-12-24 DIAGNOSIS — Z95810 Presence of automatic (implantable) cardiac defibrillator: Secondary | ICD-10-CM | POA: Diagnosis not present

## 2022-12-25 NOTE — Progress Notes (Signed)
EPIC Encounter for ICM Monitoring  Patient Name: Travis Tran is a 67 y.o. male Date: 12/25/2022 Primary Care Physican: Pcp, No Primary Cardiologist: Marlou Porch Electrophysiologist: Curt Bears 02/21/2022 Office Weight: 158 lbs   07/17/2022 Weight: 152 lbs      10/02/2022 Weight: 145 lbs       12/25/2022 Weight: 145-150 lbs                 Spoke with patient and heart failure questions reviewed.  Transmission results reviewed.  Pt asymptomatic for fluid accumulation.  Reports feeling well at this time and voices no complaints.      Optivol thoracic impedance suggesting normal fluid levels.     Prescribed:  Furosemide 20 mg Take 1 tablet (20 mg total) by mouth daily     Labs: 06/08/2022 Creatinine 1.9, BUN 31, Potassium 4.4, Sodium 140 Care Everywhere 05/29/2022 Creatinine 2.77, BUN 41, Potassium 4.1, Sodium 138, GFR 25 A complete set of results can be found in Results Review.      Recommendations:    Encouraged to call if experiencing any fluid symptoms.     Follow-up plan: ICM clinic phone appointment on 01/28/2023.   91 day device clinic remote transmission 01/14/2023.     EP/Cardiology Office Visits:  03/15/2023 with Dr Marlou Porch (1st visit-replacing Dr Tamala Julian).  Recall 08/15/2023 with Dr Curt Bears.     Copy of ICM check sent to Dr. Curt Bears.  3 month ICM trend: 12/24/2022.    12-14 Month ICM trend:     Rosalene Billings, RN 12/25/2022 4:02 PM

## 2023-01-14 ENCOUNTER — Ambulatory Visit (INDEPENDENT_AMBULATORY_CARE_PROVIDER_SITE_OTHER): Payer: Medicaid Other

## 2023-01-14 DIAGNOSIS — I5022 Chronic systolic (congestive) heart failure: Secondary | ICD-10-CM | POA: Diagnosis not present

## 2023-01-14 DIAGNOSIS — I255 Ischemic cardiomyopathy: Secondary | ICD-10-CM

## 2023-01-15 LAB — CUP PACEART REMOTE DEVICE CHECK
Battery Remaining Longevity: 41 mo
Battery Voltage: 2.96 V
Brady Statistic RV Percent Paced: 0.01 %
Date Time Interrogation Session: 20240212072307
HighPow Impedance: 69 Ohm
Implantable Lead Connection Status: 753985
Implantable Lead Implant Date: 20170808
Implantable Lead Location: 753860
Implantable Pulse Generator Implant Date: 20170808
Lead Channel Impedance Value: 285 Ohm
Lead Channel Impedance Value: 361 Ohm
Lead Channel Pacing Threshold Amplitude: 0.625 V
Lead Channel Pacing Threshold Pulse Width: 0.4 ms
Lead Channel Sensing Intrinsic Amplitude: 14.25 mV
Lead Channel Sensing Intrinsic Amplitude: 14.25 mV
Lead Channel Setting Pacing Amplitude: 2.5 V
Lead Channel Setting Pacing Pulse Width: 0.4 ms
Lead Channel Setting Sensing Sensitivity: 0.3 mV
Zone Setting Status: 755011
Zone Setting Status: 755011

## 2023-01-28 ENCOUNTER — Ambulatory Visit: Payer: Medicaid Other | Attending: Cardiology

## 2023-01-28 DIAGNOSIS — Z95810 Presence of automatic (implantable) cardiac defibrillator: Secondary | ICD-10-CM | POA: Diagnosis not present

## 2023-01-28 DIAGNOSIS — I5022 Chronic systolic (congestive) heart failure: Secondary | ICD-10-CM | POA: Diagnosis not present

## 2023-01-29 NOTE — Progress Notes (Addendum)
EPIC Encounter for ICM Monitoring  Patient Name: Travis Tran is a 67 y.o. male Date: 01/29/2023 Primary Care Physican: Pcp, No Primary Cardiologist: [facility] Electrophysiologist: Curt Bears 02/21/2022 Office Weight: 158 lbs   07/17/2022 Weight: 152 lbs      10/02/2022 Weight: 145 lbs       12/25/2022 Weight: 145-150 lbs      01/29/2023 Weight: 146 lbs            Spoke with patient and heart failure questions reviewed.  Transmission results reviewed.  Pt asymptomatic for fluid accumulation.  Reports feeling well at this time and voices no complaints.  He reports he may have eaten something that caused possible fluid accumulation.      Optivol thoracic impedance suggesting possible fluid accumulation starting 2/22.     Prescribed:  Furosemide 20 mg Take 1 tablet (20 mg total) by mouth daily     Labs: 06/08/2022 Creatinine 1.9, BUN 31, Potassium 4.4, Sodium 140 Care Everywhere 05/29/2022 Creatinine 2.77, BUN 41, Potassium 4.1, Sodium 138, GFR 25 A complete set of results can be found in Results Review.      Recommendations:    Recommendation to limit salt intake to 2000 mg daily and fluid intake to 64 oz daily.  Encouraged to call if experiencing any fluid symptoms.     Follow-up plan: ICM clinic phone appointment on 02/04/2023 to recheck fluid levels.   91 day device clinic remote transmission 04/15/2023.     EP/Cardiology Office Visits:  03/15/2023 with Dr Marlou Porch (1st visit-replacing Dr Tamala Julian).  Recall 08/15/2023 with Dr Curt Bears.     Copy of ICM check sent to Dr. Curt Bears.  3 month ICM trend: 01/28/2023.    12-14 Month ICM trend:     Rosalene Billings, RN 01/29/2023 1:56 PM

## 2023-02-04 ENCOUNTER — Ambulatory Visit: Payer: Medicaid Other | Attending: Cardiology

## 2023-02-04 DIAGNOSIS — Z95810 Presence of automatic (implantable) cardiac defibrillator: Secondary | ICD-10-CM

## 2023-02-04 DIAGNOSIS — I5022 Chronic systolic (congestive) heart failure: Secondary | ICD-10-CM

## 2023-02-05 NOTE — Progress Notes (Signed)
EPIC Encounter for ICM Monitoring  Patient Name: NAFEES STEUER is a 67 y.o. male Date: 02/05/2023 Primary Care Physican: Pcp, No Primary Cardiologist: [facility] Electrophysiologist: Curt Bears 02/21/2022 Office Weight: 158 lbs   07/17/2022 Weight: 152 lbs      10/02/2022 Weight: 145 lbs       12/25/2022 Weight: 145-150 lbs      01/29/2023 Weight: 146 lbs            Spoke with patient and heart failure questions reviewed.  Transmission results reviewed.  Pt asymptomatic for fluid accumulation.  Reports feeling well at this time and voices no complaints.     Optivol thoracic impedance suggesting fluid levels returned close to normal.     Prescribed:  Furosemide 20 mg Take 1 tablet (20 mg total) by mouth daily     Labs: 06/08/2022 Creatinine 1.9, BUN 31, Potassium 4.4, Sodium 140 Care Everywhere 05/29/2022 Creatinine 2.77, BUN 41, Potassium 4.1, Sodium 138, GFR 25 A complete set of results can be found in Results Review.      Recommendations:   No changes and encouraged to call if experiencing any fluid symptoms.    Follow-up plan: ICM clinic phone appointment on 03/04/2023.   91 day device clinic remote transmission 04/15/2023.     EP/Cardiology Office Visits:  03/15/2023 with Dr Marlou Porch (1st visit-replacing Dr Tamala Julian).  Recall 08/15/2023 with Dr Curt Bears.     Copy of ICM check sent to Dr. Curt Bears.   3 month ICM trend: 02/04/2023.    12-14 Month ICM trend:     Rosalene Billings, RN 02/05/2023 11:30 AM

## 2023-02-27 NOTE — Progress Notes (Signed)
Remote ICD transmission.   

## 2023-03-04 ENCOUNTER — Ambulatory Visit: Payer: Medicaid Other | Attending: Cardiology

## 2023-03-04 DIAGNOSIS — I5022 Chronic systolic (congestive) heart failure: Secondary | ICD-10-CM | POA: Diagnosis not present

## 2023-03-06 NOTE — Progress Notes (Signed)
EPIC Encounter for ICM Monitoring  Patient Name: Travis Tran is a 67 y.o. male Date: 03/06/2023 Primary Care Physican: Pcp, No Primary Cardiologist: [facility] Electrophysiologist: Curt Bears 01/29/2023 Weight: 146 lbs     03/06/2023 Weight: 147-150 lbs        Spoke with patient and heart failure questions reviewed.  Transmission results reviewed.  Pt asymptomatic for fluid accumulation.  Reports feeling well at this time and voices no complaints.     Optivol thoracic impedance suggesting normal fluid levels.      Prescribed:  Furosemide 20 mg Take 1 tablet (20 mg total) by mouth daily     Labs: 06/08/2022 Creatinine 1.9, BUN 31, Potassium 4.4, Sodium 140 Care Everywhere 05/29/2022 Creatinine 2.77, BUN 41, Potassium 4.1, Sodium 138, GFR 25 A complete set of results can be found in Results Review.      Recommendations:   No changes and encouraged to call if experiencing any fluid symptoms.    Follow-up plan: ICM clinic phone appointment on 04/08/2023.   91 day device clinic remote transmission 04/15/2023.     EP/Cardiology Office Visits:  03/15/2023 with Dr Marlou Porch (1st visit-replacing Dr Tamala Julian).  Recall 08/15/2023 with Dr Curt Bears.     Copy of ICM check sent to Dr. Curt Bears.   3 month ICM trend: 03/04/2023.    12-14 Month ICM trend:     Rosalene Billings, RN 03/06/2023 3:32 PM

## 2023-03-15 ENCOUNTER — Ambulatory Visit: Payer: Medicaid Other | Attending: Cardiology | Admitting: Cardiology

## 2023-03-15 ENCOUNTER — Encounter: Payer: Self-pay | Admitting: Cardiology

## 2023-03-15 VITALS — BP 130/50 | HR 54 | Ht 66.0 in | Wt 142.0 lb

## 2023-03-15 DIAGNOSIS — Q240 Dextrocardia: Secondary | ICD-10-CM

## 2023-03-15 DIAGNOSIS — E785 Hyperlipidemia, unspecified: Secondary | ICD-10-CM

## 2023-03-15 DIAGNOSIS — I255 Ischemic cardiomyopathy: Secondary | ICD-10-CM | POA: Diagnosis not present

## 2023-03-15 DIAGNOSIS — I480 Paroxysmal atrial fibrillation: Secondary | ICD-10-CM

## 2023-03-15 DIAGNOSIS — I5022 Chronic systolic (congestive) heart failure: Secondary | ICD-10-CM

## 2023-03-15 DIAGNOSIS — E1152 Type 2 diabetes mellitus with diabetic peripheral angiopathy with gangrene: Secondary | ICD-10-CM

## 2023-03-15 DIAGNOSIS — Z95810 Presence of automatic (implantable) cardiac defibrillator: Secondary | ICD-10-CM | POA: Diagnosis not present

## 2023-03-15 DIAGNOSIS — Z79899 Other long term (current) drug therapy: Secondary | ICD-10-CM
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
## 2023-03-15 NOTE — Patient Instructions (Signed)
Medication Instructions:  The current medical regimen is effective;  continue present plan and medications.  *If you need a refill on your cardiac medications before your next appointment, please call your pharmacy*   Lab Work: Please have blood work today (CBC, CMP, Lipid and HA1c)  If you have labs (blood work) drawn today and your tests are completely normal, you will receive your results only by: MyChart Message (if you have MyChart) OR A paper copy in the mail If you have any lab test that is abnormal or we need to change your treatment, we will call you to review the results.   Follow-Up: At [facility], you and your health needs are our priority.  As part of our continuing mission to provide you with exceptional heart care, we have created designated Provider Care Teams.  These Care Teams include your primary Cardiologist (physician) and Advanced Practice Providers (APPs -  Physician Assistants and Nurse Practitioners) who all work together to provide you with the care you need, when you need it.  We recommend signing up for the patient portal called "MyChart".  Sign up information is provided on this After Visit Summary.  MyChart is used to connect with patients for Virtual Visits (Telemedicine).  Patients are able to view lab/test results, encounter notes, upcoming appointments, etc.  Non-urgent messages can be sent to your provider as well.   To learn more about what you can do with MyChart, go to [email or web address].    Your next appointment:   5 month(s)  Provider:   With NP/PA same day as Dr Lorenso Courier (due September) And  Donato Schultz, MD  in 1 year.

## 2023-03-16 LAB — COMPREHENSIVE METABOLIC PANEL
ALT: 8 IU/L (ref 0–44)
AST: 13 IU/L (ref 0–40)
Albumin/Globulin Ratio: 1.7 (ref 1.2–2.2)
Albumin: 4.2 g/dL (ref 3.9–4.9)
Alkaline Phosphatase: 78 IU/L (ref 44–121)
BUN/Creatinine Ratio: 20 (ref 10–24)
BUN: 49 mg/dL — ABNORMAL HIGH (ref 8–27)
Bilirubin Total: 0.3 mg/dL (ref 0.0–1.2)
CO2: 22 mmol/L (ref 20–29)
Calcium: 9.7 mg/dL (ref 8.6–10.2)
Chloride: 104 mmol/L (ref 96–106)
Creatinine, Ser: 2.42 mg/dL — ABNORMAL HIGH (ref 0.76–1.27)
Globulin, Total: 2.5 g/dL (ref 1.5–4.5)
Glucose: 90 mg/dL (ref 70–99)
Potassium: 4.4 mmol/L (ref 3.5–5.2)
Sodium: 144 mmol/L (ref 134–144)
Total Protein: 6.7 g/dL (ref 6.0–8.5)
eGFR: 29 mL/min/{1.73_m2} — ABNORMAL LOW (ref >59)

## 2023-03-16 LAB — CBC
Hematocrit: 40.2 % (ref 37.5–51.0)
Hemoglobin: 12.9 g/dL — ABNORMAL LOW (ref 13.0–17.7)
MCH: 27.4 pg (ref 26.6–33.0)
MCHC: 32.1 g/dL (ref 31.5–35.7)
MCV: 85 fL (ref 79–97)
Platelets: 242 10*3/uL (ref 150–450)
RBC: 4.71 x10E6/uL (ref 4.14–5.80)
RDW: 13.3 % (ref 11.6–15.4)
WBC: 10.5 10*3/uL (ref 3.4–10.8)

## 2023-03-16 LAB — LIPID PANEL
Chol/HDL Ratio: 1.9 ratio (ref 0.0–5.0)
Cholesterol, Total: 133 mg/dL (ref 100–199)
HDL: 69 mg/dL (ref >39)
LDL Chol Calc (NIH): 48 mg/dL (ref 0–99)
Triglycerides: 84 mg/dL (ref 0–149)
VLDL Cholesterol Cal: 16 mg/dL (ref 5–40)

## 2023-03-16 LAB — HEMOGLOBIN A1C
Est. average glucose Bld gHb Est-mCnc: 166 mg/dL
Hgb A1c MFr Bld: 7.4 % — ABNORMAL HIGH (ref 4.8–5.6)

## 2023-04-08 ENCOUNTER — Ambulatory Visit: Payer: Medicaid Other | Attending: Cardiology

## 2023-04-08 DIAGNOSIS — I5022 Chronic systolic (congestive) heart failure: Secondary | ICD-10-CM

## 2023-04-08 DIAGNOSIS — Z95810 Presence of automatic (implantable) cardiac defibrillator: Secondary | ICD-10-CM

## 2023-04-08 NOTE — Progress Notes (Signed)
EPIC Encounter for ICM Monitoring  Patient Name: Travis Tran is a 67 y.o. male Date: 04/08/2023 Primary Care Physican: Pcp, No Primary Cardiologist: [facility] Electrophysiologist: Elberta Fortis 01/29/2023 Weight: 146 lbs     03/06/2023 Weight: 147-150 lbs        Spoke with patient and heart failure questions reviewed.  Transmission results reviewed.  Pt asymptomatic for fluid accumulation.  He has been eating restaurant foods more in the last week than normal.   Optivol thoracic impedance suggesting possible fluid accumulation starting 5/1.      Prescribed:  Furosemide 20 mg Take 1 tablet (20 mg total) by mouth daily     Labs: 06/08/2022 Creatinine 1.9, BUN 31, Potassium 4.4, Sodium 140 Care Everywhere 05/29/2022 Creatinine 2.77, BUN 41, Potassium 4.1, Sodium 138, GFR 25 A complete set of results can be found in Results Review.      Recommendations:   Encouraged to decrease salt intake and call if experiencing any fluid symptoms.     Follow-up plan: ICM clinic phone appointment on 04/15/2023 to recheck fluid levels.   91 day device clinic remote transmission 04/15/2023.     EP/Cardiology Office Visits:  08/06/2023 with Robin Searing, NP.  08/06/2023 with Dr Elberta Fortis.     Copy of ICM check sent to Dr. Elberta Fortis.   3 month ICM trend: 04/08/2023.    12-14 Month ICM trend:     Karie Soda, RN 04/08/2023 1:47 PM

## 2023-04-11 ENCOUNTER — Other Ambulatory Visit: Payer: Self-pay

## 2023-04-11 MED ORDER — ATORVASTATIN CALCIUM 80 MG PO TABS: ORAL_TABLET | ORAL | 3 refills | Status: DC

## 2023-04-15 ENCOUNTER — Ambulatory Visit: Payer: Medicaid Other | Attending: Cardiology

## 2023-04-15 ENCOUNTER — Ambulatory Visit (INDEPENDENT_AMBULATORY_CARE_PROVIDER_SITE_OTHER): Payer: Medicaid Other

## 2023-04-15 DIAGNOSIS — I255 Ischemic cardiomyopathy: Secondary | ICD-10-CM | POA: Diagnosis not present

## 2023-04-15 DIAGNOSIS — I5022 Chronic systolic (congestive) heart failure: Secondary | ICD-10-CM

## 2023-04-15 DIAGNOSIS — I480 Paroxysmal atrial fibrillation: Secondary | ICD-10-CM

## 2023-04-15 DIAGNOSIS — Z95810 Presence of automatic (implantable) cardiac defibrillator: Secondary | ICD-10-CM

## 2023-04-15 LAB — CUP PACEART REMOTE DEVICE CHECK
Battery Remaining Longevity: 37 mo
Battery Voltage: 2.96 V
Brady Statistic RV Percent Paced: 0.01 %
Date Time Interrogation Session: 20240513031707
HighPow Impedance: 58 Ohm
Implantable Lead Connection Status: 753985
Implantable Lead Implant Date: 20170808
Implantable Lead Location: 753860
Implantable Pulse Generator Implant Date: 20170808
Lead Channel Impedance Value: 285 Ohm
Lead Channel Impedance Value: 342 Ohm
Lead Channel Pacing Threshold Amplitude: 0.625 V
Lead Channel Pacing Threshold Pulse Width: 0.4 ms
Lead Channel Sensing Intrinsic Amplitude: 10.625 mV
Lead Channel Sensing Intrinsic Amplitude: 10.625 mV
Lead Channel Setting Pacing Amplitude: 2.5 V
Lead Channel Setting Pacing Pulse Width: 0.4 ms
Lead Channel Setting Sensing Sensitivity: 0.3 mV
Zone Setting Status: 755011
Zone Setting Status: 755011

## 2023-04-15 NOTE — Progress Notes (Unsigned)
EPIC Encounter for ICM Monitoring  Patient Name: Travis Tran is a 67 y.o. male Date: 04/15/2023 Primary Care Physican: Pcp, No Primary Cardiologist: [facility] Electrophysiologist: Elberta Fortis 01/29/2023 Weight: 146 lbs     03/06/2023 Weight: 147-150 lbs    04/16/2023 Weight: 152 lbs     Spoke with patient and heart failure questions reviewed.  Transmission results reviewed.  Pt asymptomatic for fluid accumulation.  Reports feeling well at this time and voices no complaints.     Optivol thoracic impedance suggesting possible fluid accumulation continues but improving.  Possible accumulation started 5/1.      Prescribed:  Furosemide 20 mg Take 1 tablet (20 mg total) by mouth daily     Labs: 06/08/2022 Creatinine 1.9, BUN 31, Potassium 4.4, Sodium 140 Care Everywhere 05/29/2022 Creatinine 2.77, BUN 41, Potassium 4.1, Sodium 138, GFR 25 A complete set of results can be found in Results Review.      Recommendations:   Advised to limit salt intake.  Copy sent to Dr Anne Fu for review and recommendations if needed.      Follow-up plan: ICM clinic phone appointment on 04/22/2023 (manual) to recheck fluid levels.   91 day device clinic remote transmission 07/15/2023.     EP/Cardiology Office Visits:  08/06/2023 with Robin Searing, NP.  08/06/2023 with Dr Elberta Fortis.     Copy of ICM check sent to Dr. Elberta Fortis.   3 month ICM trend: 04/15/2023.    12-14 Month ICM trend:     Karie Soda, RN 04/15/2023 1:37 PM

## 2023-04-22 ENCOUNTER — Ambulatory Visit: Payer: Medicaid Other | Attending: Cardiology

## 2023-04-22 ENCOUNTER — Other Ambulatory Visit: Payer: Self-pay | Admitting: *Deleted

## 2023-04-22 DIAGNOSIS — Z95810 Presence of automatic (implantable) cardiac defibrillator: Secondary | ICD-10-CM

## 2023-04-22 DIAGNOSIS — I5022 Chronic systolic (congestive) heart failure: Secondary | ICD-10-CM

## 2023-04-22 MED ORDER — CARVEDILOL 6.25 MG PO TABS: ORAL_TABLET | ORAL | 3 refills | Status: DC

## 2023-04-24 NOTE — Progress Notes (Signed)
EPIC Encounter for ICM Monitoring  Patient Name: Travis Tran is a 67 y.o. male Date: 04/24/2023 Primary Care Physican: Pcp, No Primary Cardiologist: [facility] Electrophysiologist: Elberta Fortis 01/29/2023 Weight: 146 lbs     03/06/2023 Weight: 147-150 lbs    04/16/2023 Weight: 152 lbs     Spoke with patient and heart failure questions reviewed.  Transmission results reviewed.  Pt asymptomatic for fluid accumulation.  He has a scratchy throat and feeling tired.     Optivol thoracic impedance suggesting fluid levels returned close to normal.      Prescribed:  Furosemide 20 mg Take 1 tablet (20 mg total) by mouth daily     Labs: 06/08/2022 Creatinine 1.9, BUN 31, Potassium 4.4, Sodium 140 Care Everywhere 05/29/2022 Creatinine 2.77, BUN 41, Potassium 4.1, Sodium 138, GFR 25 A complete set of results can be found in Results Review.      Recommendations:   No changes and encouraged to call if experiencing any fluid symptoms.    Follow-up plan: ICM clinic phone appointment on 05/13/2023.   91 day device clinic remote transmission 07/15/2023.     EP/Cardiology Office Visits:  08/06/2023 with Robin Searing, NP.  08/06/2023 with Dr Elberta Fortis.     Copy of ICM check sent to Dr. Elberta Fortis.   3 month ICM trend: 04/22/2023.    12-14 Month ICM trend:     Karie Soda, RN 04/24/2023 1:56 PM

## 2023-05-13 ENCOUNTER — Ambulatory Visit: Payer: Medicaid Other | Attending: Cardiology

## 2023-05-13 DIAGNOSIS — I5022 Chronic systolic (congestive) heart failure: Secondary | ICD-10-CM

## 2023-05-13 DIAGNOSIS — Z95810 Presence of automatic (implantable) cardiac defibrillator: Secondary | ICD-10-CM

## 2023-05-13 NOTE — Progress Notes (Signed)
Remote ICD transmission.   

## 2023-05-15 NOTE — Progress Notes (Signed)
EPIC Encounter for ICM Monitoring  Patient Name: Travis Tran is a 67 y.o. male Date: 05/15/2023 Primary Care Physican: Pcp, No Primary Cardiologist: [facility] Electrophysiologist: Elberta Fortis 01/29/2023 Weight: 146 lbs     03/06/2023 Weight: 147-150 lbs    04/16/2023 Weight: 152 lbs   05/15/2023 Weight: 151-152 lbs   Spoke with patient and heart failure questions reviewed.  Transmission results reviewed.  Pt asymptomatic for fluid accumulation.  He has a scratchy throat and feeling tired.     Optivol thoracic impedance suggesting intermittent days with possible fluid accumulation within the last month.      Prescribed:  Furosemide 20 mg Take 1 tablet (20 mg total) by mouth daily     Labs: 06/08/2022 Creatinine 1.9, BUN 31, Potassium 4.4, Sodium 140 Care Everywhere 05/29/2022 Creatinine 2.77, BUN 41, Potassium 4.1, Sodium 138, GFR 25 A complete set of results can be found in Results Review.      Recommendations:   No changes and encouraged to call if experiencing any fluid symptoms.    Follow-up plan: ICM clinic phone appointment on 06/17/2023.   91 day device clinic remote transmission 07/15/2023.     EP/Cardiology Office Visits:  08/06/2023 with Robin Searing, NP.  08/06/2023 with Dr Elberta Fortis.     Copy of ICM check sent to Dr. Elberta Fortis.   3 month ICM trend: 05/13/2023.    12-14 Month ICM trend:     Karie Soda, RN 05/15/2023 11:55 AM

## 2023-05-27 ENCOUNTER — Other Ambulatory Visit: Payer: Self-pay

## 2023-05-27 MED ORDER — FUROSEMIDE 20 MG PO TABS: 20.0000 mg | ORAL_TABLET | Freq: Every day | ORAL | 3 refills | Status: DC

## 2023-06-17 ENCOUNTER — Ambulatory Visit: Payer: 59 | Attending: Cardiology

## 2023-06-17 DIAGNOSIS — Z95810 Presence of automatic (implantable) cardiac defibrillator: Secondary | ICD-10-CM

## 2023-06-17 DIAGNOSIS — I5022 Chronic systolic (congestive) heart failure: Secondary | ICD-10-CM

## 2023-06-23 NOTE — Progress Notes (Signed)
EPIC Encounter for ICM Monitoring  Patient Name: Travis Tran is a 67 y.o. male Date: 06/23/2023 Primary Care Physican: Pcp, No Primary Cardiologist: [facility] Electrophysiologist: Elberta Fortis 01/29/2023 Weight: 146 lbs     03/06/2023 Weight: 147-150 lbs    04/16/2023 Weight: 152 lbs   05/15/2023 Weight: 151-152 lbs   Transmission results reviewed.      Optivol thoracic impedance suggesting intermittent days with possible fluid accumulation within the last month.      Prescribed:  Furosemide 20 mg Take 1 tablet (20 mg total) by mouth daily     Labs: 06/08/2022 Creatinine 1.9, BUN 31, Potassium 4.4, Sodium 140 Care Everywhere 05/29/2022 Creatinine 2.77, BUN 41, Potassium 4.1, Sodium 138, GFR 25 A complete set of results can be found in Results Review.      Recommendations:   No changes.    Follow-up plan: ICM clinic phone appointment on 07/22/2023.   91 day device clinic remote transmission 07/15/2023.     EP/Cardiology Office Visits:  08/06/2023 with Robin Searing, NP.  08/06/2023 with Dr Elberta Fortis.     Copy of ICM check sent to Dr. Elberta Fortis.    3 month ICM trend: 06/17/2023.    12-14 Month ICM trend:     Karie Soda, RN 06/23/2023 12:07 PM

## 2023-07-15 ENCOUNTER — Ambulatory Visit: Payer: 59

## 2023-07-15 DIAGNOSIS — I255 Ischemic cardiomyopathy: Secondary | ICD-10-CM

## 2023-07-22 ENCOUNTER — Ambulatory Visit: Payer: 59

## 2023-07-22 DIAGNOSIS — Z95810 Presence of automatic (implantable) cardiac defibrillator: Secondary | ICD-10-CM

## 2023-07-22 DIAGNOSIS — I5022 Chronic systolic (congestive) heart failure: Secondary | ICD-10-CM | POA: Diagnosis not present

## 2023-07-25 ENCOUNTER — Telehealth: Payer: Self-pay

## 2023-07-25 NOTE — Telephone Encounter (Signed)
Remote ICM transmission received.  Attempted call to patient regarding ICM remote transmission and mail box is full. 

## 2023-07-25 NOTE — Progress Notes (Signed)
EPIC Encounter for ICM Monitoring  Patient Name: Travis Tran is a 67 y.o. male Date: 07/25/2023 Primary Care Physican: Pcp, No Primary Cardiologist: [facility] Electrophysiologist: Elberta Fortis 01/29/2023 Weight: 146 lbs     03/06/2023 Weight: 147-150 lbs    04/16/2023 Weight: 152 lbs   05/15/2023 Weight: 151-152 lbs   Attempted call to patient and unable to reach.   Transmission reviewed.    Optivol thoracic impedance suggesting intermittent days with possible fluid accumulation within the last month.      Prescribed:  Furosemide 20 mg Take 1 tablet (20 mg total) by mouth daily     Labs: 05/16/2023 Creatinine 2.53, BUN 55, Potassium 4.6, Sodium 139, GFR 28 03/15/2023 Creatinine 2.42, BUN 49, Potassium 4.4, Sodium 144, GFR 29 A complete set of results can be found in Results Review.      Recommendations:   Unable to reach.      Follow-up plan: ICM clinic phone appointment on 08/26/2023.   91 day device clinic remote transmission 10/14/2023.     EP/Cardiology Office Visits:  08/06/2023 with Robin Searing, NP.  08/06/2023 with Dr Elberta Fortis.     Copy of ICM check sent to Dr. Elberta Fortis.    3 month ICM trend: 07/22/2023.    12-14 Month ICM trend:     Karie Soda, RN 07/25/2023 9:30 AM

## 2023-07-29 NOTE — Progress Notes (Signed)
Remote ICD transmission.   

## 2023-08-05 NOTE — Progress Notes (Unsigned)
Cardiology Office Note    Patient Name: Travis Tran Date of Encounter: 08/06/2023  Primary Care Provider:  Pcp, No Primary Cardiologist:  Donato Schultz, MD Primary Electrophysiologist: [name], MD   Past Medical History    Past Medical History:  Diagnosis Date   Anxiety    Asthma    CAD, multiple vessel    CHF (congestive heart failure) (HCC)    [name] 03/20/2016   Chronic kidney disease (CKD), stage II (mild)    [name] 03/20/2016   Claustrophobia    GERD (gastroesophageal reflux disease)    "once in awhile"   High cholesterol    Hypertension    Psoriatic arthritis (HCC)    Rheumatoid arthritis (HCC)    S/P CABG x 5    [facility] RIMA-LAD, SVG-OM, SVG-DIAG, seqSVG-RPDA-dRCA    Situs inversus with dextrocardia    Stroke [facility])    TIA (transient ischemic attack) ?03/17/2016   Type II diabetes mellitus (HCC)     History of Present Illness  Travis Tran is a 67 y.o. male with a PMH of CAD s/p CABG x 5 at [facility] 2011, HTN, permanent AF, HLD, RA, IDDM type II, bilateral carotid artery disease, TIA s/p carotid angioplasty and stent 03/2018 GERD, CKD stage IIIb, mixed CM, HFrEF s/p MDT ICD, asthma who presents today for 7-month follow-up.  [name] has a significant cardiac history and was initially followed by Dr. Katrinka Blazing. He is currently followed by Dr. Anne Fu for management of CAD and CHF.  He was seen initially in 02/2016 for complaint of edema and shortness of breath.  He underwent R/LHC that showed severe native CAD with patent RIMA to LAD widely patent saphenous to RCA and diseased distal vessel with no PCI adequate management.  He was placed on low-dose GDMT due to decreased BP was referred to Dr. Elberta Fortis and underwent ICD placement 07/10/2016.  He was last seen by Dr. Katrinka Blazing on 08/20/22 and was doing well and stable on GDMT.  He was seen by Dr. Anne Fu on 03/15/2023 to establish care and continued to do well at that time.  He is currently on optimize GDMT with Entresto,  Aldactone, Farxiga, and carvedilol.  He currently drives in from [facility] for his cardiac management.  During today's visit the patient reports they are doing well with some indiscretions with sodium noted.  He is up 14 pounds compared to his April visit and reports indiscretions with salt due to his birthday which was celebrated last month.  He reports some orthopnea that is relieved with 2 pillows.  He reports going to [employer] and also notes other indiscretions throughout the month of August.  His blood pressure today was controlled at 130/78 and heart rate was 76 bpm.  He was contacted recently by our device nurse elevated readings on his OptiVol setting. He reports has recently joined [employer] and has begun for 1 hour daily.  He reports some shortness of breath and chest tightness that improves with rest.  He is aware to contact us if chest pain is not resolved with rest and reduces his ability to exercise.  Patient denies chest pain, palpitations, dyspnea, PND, orthopnea, nausea, vomiting, dizziness, syncope, edema, weight gain, or early satiety.  Review of Systems  Please see the history of present illness.    All other systems reviewed and are otherwise negative except as noted above.  Physical Exam    Wt Readings from Last 3 Encounters:  08/06/23 158 lb (  71.7 kg)  03/15/23 142 lb (64.4 kg)  08/20/22 157 lb (71.2 kg)   VS: Vitals:   08/06/23 1003  BP: 130/78  Pulse: 76  SpO2: 99%  ,Body mass index is 25.5 kg/m. GEN: Well nourished, well developed in no acute distress Neck: No JVD; No carotid bruits Pulmonary: Clear to auscultation without rales, wheezing or rhonchi  Cardiovascular: Normal rate. Regular rhythm. Normal S1. Normal S2.   Murmurs: There is no murmur.  ABDOMEN: Soft, non-tender, non-distended EXTREMITIES:  No edema; No deformity   EKG/LABS/ Recent Cardiac Studies   ECG personally reviewed by me today -sinus with PVCs and left anterior  fascicular block TWI in lateral leads consistent with previous EKG.  Risk Assessment/Calculations:        Lab Results  Component Value Date   WBC 10.5 03/15/2023   HGB 12.9 (L) 03/15/2023   HCT 40.2 03/15/2023   MCV 85 03/15/2023   PLT 242 03/15/2023   Lab Results  Component Value Date   CREATININE 2.42 (H) 03/15/2023   BUN 49 (H) 03/15/2023   NA 144 03/15/2023   K 4.4 03/15/2023   CL 104 03/15/2023   CO2 22 03/15/2023   Lab Results  Component Value Date   CHOL 133 03/15/2023   HDL 69 03/15/2023   LDLCALC 48 03/15/2023   TRIG 84 03/15/2023   CHOLHDL 1.9 03/15/2023    Lab Results  Component Value Date   HGBA1C 7.4 (H) 03/15/2023   Assessment & Plan    1.  Chronic combined CHF: - current GDMT optimized and patient reports indiscretions with salt due to his recent birthday. -He has gained a total of 14 pounds since previous visit and recent OptiVol report showed increased impedance -He was advised to take additional 20 mg of Lasix if he notices weight gain of 2 pounds in 24 hours or 5 pounds in 1 week. -Continue current GDMT with carvedilol to 6.25 mg twice daily, Farxiga 10 mg daily, Entresto 97/110 3 mg daily, Lasix 20 mg daily -Low sodium diet, fluid restriction <2L, and daily weights encouraged. Educated to contact our office for weight gain of 2 lbs overnight or 5 lbs in one week.   2.  Coronary artery disease: -s/p CABG x 5 in 2011 with failed vein grafts and subsequent PCI currently managed -Today patient reports some chest discomfort while walking on the treadmill that resolved with rest. -He is aware to contact us and carry his Nitrostat while exercising -Continue current GDMT with ASA 81 mg, Lipitor 80 mg, carvedilol 6.25 mg Plavix 75 mg daily, ezetimibe 10 mg daily  3.ICM: -s/p Medtronic ICD placed 2017 and currently followed by Dr. Elberta Fortis -Recent elevation in OptiVol improvement and diuresis with Lasix  4.  Bilateral carotid artery disease: -s/p carotid  stent placed by Dr. Corliss Skains in 2017 -Continue current GDMT with Plavix 75 mg and ASA 81 mg  5. Permanent atrial fibrillation: -Low burden per most recent Paceart report. -Continue ASA 81 mg and allow 6.25 mg twice daily    Disposition: Follow-up with Donato Schultz, MD or APP in 6 months    Signed, Napoleon Form, Leodis Rains, NP 08/06/2023, 10:18 AM [facility]

## 2023-08-06 ENCOUNTER — Encounter: Payer: Self-pay | Admitting: Nurse Practitioner

## 2023-08-06 ENCOUNTER — Encounter: Payer: Self-pay | Admitting: Cardiology

## 2023-08-06 ENCOUNTER — Ambulatory Visit: Payer: 59 | Attending: Cardiology | Admitting: Cardiology

## 2023-08-06 ENCOUNTER — Ambulatory Visit (INDEPENDENT_AMBULATORY_CARE_PROVIDER_SITE_OTHER): Payer: 59 | Admitting: Nurse Practitioner

## 2023-08-06 VITALS — BP 130/78 | HR 76 | Ht 66.0 in | Wt 158.0 lb

## 2023-08-06 DIAGNOSIS — I5022 Chronic systolic (congestive) heart failure: Secondary | ICD-10-CM

## 2023-08-06 DIAGNOSIS — I2510 Atherosclerotic heart disease of native coronary artery without angina pectoris: Secondary | ICD-10-CM | POA: Diagnosis not present

## 2023-08-06 DIAGNOSIS — Z95810 Presence of automatic (implantable) cardiac defibrillator: Secondary | ICD-10-CM

## 2023-08-06 DIAGNOSIS — I255 Ischemic cardiomyopathy: Secondary | ICD-10-CM | POA: Diagnosis not present

## 2023-08-06 DIAGNOSIS — I25709 Atherosclerosis of coronary artery bypass graft(s), unspecified, with unspecified angina pectoris: Secondary | ICD-10-CM

## 2023-08-06 DIAGNOSIS — I4821 Permanent atrial fibrillation: Secondary | ICD-10-CM

## 2023-08-06 MED ORDER — FUROSEMIDE 20 MG PO TABS: 20.0000 mg | ORAL_TABLET | Freq: Every day | ORAL | Status: DC | PRN

## 2023-08-06 NOTE — Patient Instructions (Signed)
Medication Instructions:  Take 20mg  of Lasix as needed  *If you need a refill on your cardiac medications before your next appointment, please call your pharmacy*   Lab Work: TODAY-BMET & BNP If you have labs (blood work) drawn today and your tests are completely normal, you will receive your results only by: MyChart Message (if you have MyChart) OR A paper copy in the mail If you have any lab test that is abnormal or we need to change your treatment, we will call you to review the results.   Testing/Procedures: NONE ORDERED   Follow-Up: At [facility], you and your health needs are our priority.  As part of our continuing mission to provide you with exceptional heart care, we have created designated Provider Care Teams.  These Care Teams include your primary Cardiologist (physician) and Advanced Practice Providers (APPs -  Physician Assistants and Nurse Practitioners) who all work together to provide you with the care you need, when you need it.  We recommend signing up for the patient portal called "MyChart".  Sign up information is provided on this After Visit Summary.  MyChart is used to connect with patients for Virtual Visits (Telemedicine).  Patients are able to view lab/test results, encounter notes, upcoming appointments, etc.  Non-urgent messages can be sent to your provider as well.   To learn more about what you can do with MyChart, go to [email or web address].    Your next appointment:   6 month(s)  Provider:   Donato Schultz, MD  or Robin Searing, NP   Other Instructions Please check your weight daily. Please contact the office if you gain more than 3lbs in a day or 5lbs in a week.  Limit your salt intake to 1500-2000mg  per day or 500mg  of Sodium per meal.

## 2023-08-06 NOTE — Progress Notes (Signed)
  Electrophysiology Office Note:   Date:  08/06/2023  ID:  Travis Tran, DOB Jul 30, 1956, MRN [id number]  Primary Cardiologist: Donato Schultz, MD Electrophysiologist: Regan Lemming, MD      History of Present Illness:   Travis Tran is a 67 y.o. male with h/o chronic systolic heart failure seen today for routine electrophysiology followup.  Since last being seen in our clinic the patient reports doing well.  He has noted no shortness of breath or fatigue.  He is able to do his daily activities.  He has no acute complaints..  he denies chest pain, palpitations, dyspnea, PND, orthopnea, nausea, vomiting, dizziness, syncope, edema, weight gain, or early satiety.   Review of systems complete and found to be negative unless listed in HPI.      EP Information / Studies Reviewed:    EKG is not ordered today. EKG from 08/06/23 reviewed which showed sinus rhythm with PVCs      ICD Interrogation-  reviewed in detail today,  See PACEART report.  Device History: Medtronic Single Chamber ICD implanted 2017 for chronic systolic heart failure History of appropriate therapy: No History of AAD therapy: No   Risk Assessment/Calculations:    CHA2DS2-VASc Score =     This indicates a  % annual risk of stroke. The patient's score is based upon:               Physical Exam:   VS:  BP 130/78 (BP Location: Left Arm, Patient Position: Sitting, Cuff Size: Normal)   Pulse 76   Ht 5\' 6"  (1.676 m)   Wt 158 lb (71.7 kg)   SpO2 99%   BMI 25.50 kg/m    Wt Readings from Last 3 Encounters:  08/06/23 158 lb (71.7 kg)  08/06/23 158 lb (71.7 kg)  03/15/23 142 lb (64.4 kg)     GEN: Well nourished, well developed in no acute distress NECK: No JVD; No carotid bruits CARDIAC: Regular rate and rhythm, no murmurs, rubs, gallops RESPIRATORY:  Clear to auscultation without rales, wheezing or rhonchi  ABDOMEN: Soft, non-tender, non-distended EXTREMITIES:  No edema; No deformity   ASSESSMENT AND PLAN:     Chronic systolic dysfunction s/p Medtronic single chamber ICD  euvolemic today Stable on an appropriate medical regimen Normal ICD function See Pace Art report No changes today  2.  Hypertension: Currently well-controlled  3.  Coronary disease: No current chest pain  4.  Paroxysmal atrial fibrillation: Noted on device interrogation.  Short episodes and thus no anticoagulation.  Disposition:   Follow up with EP APP in 12 months   Signed, Janye Maynor [name], MD

## 2023-08-07 LAB — BASIC METABOLIC PANEL
BUN/Creatinine Ratio: 26 — ABNORMAL HIGH (ref 10–24)
BUN: 63 mg/dL — ABNORMAL HIGH (ref 8–27)
CO2: 22 mmol/L (ref 20–29)
Calcium: 9.5 mg/dL (ref 8.6–10.2)
Chloride: 103 mmol/L (ref 96–106)
Creatinine, Ser: 2.43 mg/dL — ABNORMAL HIGH (ref 0.76–1.27)
Glucose: 151 mg/dL — ABNORMAL HIGH (ref 70–99)
Potassium: 4.5 mmol/L (ref 3.5–5.2)
Sodium: 138 mmol/L (ref 134–144)
eGFR: 29 mL/min/{1.73_m2} — ABNORMAL LOW (ref >59)

## 2023-08-07 LAB — PRO B NATRIURETIC PEPTIDE: NT-Pro BNP: 777 pg/mL — ABNORMAL HIGH (ref 0–376)

## 2023-08-19 ENCOUNTER — Encounter: Payer: Medicaid Other | Admitting: Student

## 2023-08-26 ENCOUNTER — Ambulatory Visit: Payer: 59 | Attending: Cardiology

## 2023-08-26 DIAGNOSIS — I5022 Chronic systolic (congestive) heart failure: Secondary | ICD-10-CM | POA: Diagnosis not present

## 2023-08-26 DIAGNOSIS — Z95810 Presence of automatic (implantable) cardiac defibrillator: Secondary | ICD-10-CM

## 2023-08-29 NOTE — Progress Notes (Signed)
EPIC Encounter for ICM Monitoring  Patient Name: Travis Tran is a 67 y.o. male Date: 08/29/2023 Primary Care Physican: Pcp, No Primary Cardiologist: [facility] Electrophysiologist: Elberta Fortis 01/29/2023 Weight: 146 lbs     03/06/2023 Weight: 147-150 lbs    04/16/2023 Weight: 152 lbs   05/15/2023 Weight: 151-152 lbs   Spoke with patient and heart failure questions reviewed.  Transmission results reviewed.  Pt asymptomatic for fluid accumulation.  Reports feeling well at this time and voices no complaints.     Optivol thoracic impedance suggesting normal fluid levels within the last month.      Prescribed:  Furosemide 20 mg Take 1 tablet (20 mg total) by mouth daily     Labs: 05/16/2023 Creatinine 2.53, BUN 55, Potassium 4.6, Sodium 139, GFR 28 03/15/2023 Creatinine 2.42, BUN 49, Potassium 4.4, Sodium 144, GFR 29 A complete set of results can be found in Results Review.      Recommendations:  No changes and encouraged to call if experiencing any fluid symptoms.      Follow-up plan: ICM clinic phone appointment on 09/30/2023.   91 day device clinic remote transmission 10/14/2023.     EP/Cardiology Office Visits:  Recall 03/09/2024 with Dr Anne Fu.  Next EP appt due 08/2024 with EP APP (no recall).     Copy of ICM check sent to Dr. Elberta Fortis.    3 month ICM trend: 08/26/2023.    12-14 Month ICM trend:     Karie Soda, RN 08/29/2023 9:53 AM

## 2023-09-30 ENCOUNTER — Ambulatory Visit: Payer: 59 | Attending: Cardiology

## 2023-09-30 DIAGNOSIS — I5022 Chronic systolic (congestive) heart failure: Secondary | ICD-10-CM | POA: Diagnosis not present

## 2023-09-30 DIAGNOSIS — Z95810 Presence of automatic (implantable) cardiac defibrillator: Secondary | ICD-10-CM | POA: Diagnosis not present

## 2023-10-09 NOTE — Progress Notes (Signed)
EPIC Encounter for ICM Monitoring  Patient Name: Travis Tran is a 67 y.o. male Date: 10/09/2023 Primary Care Physican: Pcp, No Primary Cardiologist: [facility] Electrophysiologist: Camnitz 01/29/2023 Weight: 146 lbs     03/06/2023 Weight: 147-150 lbs    04/16/2023 Weight: 152 lbs   05/15/2023 Weight: 151-152 lbs 08/06/2023 Office Weight: 158 lbs   Transmission results reviewed.      Optivol thoracic impedance suggesting normal fluid levels within the last month.      Prescribed:  Furosemide 20 mg Take 1 tablet (20 mg total) by mouth daily as needed   Labs: 08/06/2023 Creatinine 2.43, BUN 63, Potassium 4.5, Sodium 138, GFR 29 08/02/2023 Creatinine 2.30, BUN 61, Potassium 4.1, Sodium 141, GFR 30.40 05/16/2023 Creatinine 2.53, BUN 55, Potassium 4.6, Sodium 139, GFR 28 03/15/2023 Creatinine 2.42, BUN 49, Potassium 4.4, Sodium 144, GFR 29 A complete set of results can be found in Results Review.      Recommendations:  No changes.      Follow-up plan: ICM clinic phone appointment on 11/11/2023.   91 day device clinic remote transmission 10/14/2023.     EP/Cardiology Office Visits:  Recall 03/09/2024 with Dr Anne Fu.  Next EP appt due 08/2024 with EP APP (no recall).     Copy of ICM check sent to Dr. Elberta Fortis.    3 month ICM trend: 09/30/2023.    12-14 Month ICM trend:     Karie Soda, RN 10/09/2023 3:28 PM

## 2023-10-14 ENCOUNTER — Encounter: Payer: Self-pay | Admitting: Cardiology

## 2023-10-14 ENCOUNTER — Ambulatory Visit (INDEPENDENT_AMBULATORY_CARE_PROVIDER_SITE_OTHER): Payer: 59

## 2023-10-14 DIAGNOSIS — I5022 Chronic systolic (congestive) heart failure: Secondary | ICD-10-CM

## 2023-10-14 DIAGNOSIS — I255 Ischemic cardiomyopathy: Secondary | ICD-10-CM

## 2023-10-15 LAB — CUP PACEART REMOTE DEVICE CHECK
Battery Remaining Longevity: 35 mo
Battery Voltage: 2.96 V
Brady Statistic RV Percent Paced: 0.01 %
Date Time Interrogation Session: 20241111033526
HighPow Impedance: 68 Ohm
Implantable Lead Connection Status: 753985
Implantable Lead Implant Date: 20170808
Implantable Lead Location: 753860
Implantable Pulse Generator Implant Date: 20170808
Lead Channel Impedance Value: 304 Ohm
Lead Channel Impedance Value: 361 Ohm
Lead Channel Pacing Threshold Amplitude: 0.625 V
Lead Channel Pacing Threshold Pulse Width: 0.4 ms
Lead Channel Sensing Intrinsic Amplitude: 14 mV
Lead Channel Sensing Intrinsic Amplitude: 14 mV
Lead Channel Setting Pacing Amplitude: 2.5 V
Lead Channel Setting Pacing Pulse Width: 0.4 ms
Lead Channel Setting Sensing Sensitivity: 0.3 mV
Zone Setting Status: 755011
Zone Setting Status: 755011

## 2023-11-08 NOTE — Progress Notes (Signed)
Remote ICD transmission.   

## 2023-11-11 ENCOUNTER — Ambulatory Visit: Payer: 59 | Attending: Cardiology

## 2023-11-11 DIAGNOSIS — Z95810 Presence of automatic (implantable) cardiac defibrillator: Secondary | ICD-10-CM

## 2023-11-11 DIAGNOSIS — I5022 Chronic systolic (congestive) heart failure: Secondary | ICD-10-CM | POA: Diagnosis not present

## 2023-11-13 ENCOUNTER — Telehealth: Payer: Self-pay

## 2023-11-13 NOTE — Progress Notes (Signed)
EPIC Encounter for ICM Monitoring  Patient Name: Travis Tran is a 67 y.o. male Date: 11/13/2023 Primary Care Physican: Pcp, No Primary Cardiologist: [facility] Electrophysiologist: Camnitz 01/29/2023 Weight: 146 lbs     03/06/2023 Weight: 147-150 lbs    04/16/2023 Weight: 152 lbs   05/15/2023 Weight: 151-152 lbs 08/06/2023 Office Weight: 158 lbs 11/13/2023 Weight: 148-150 lbs   Spoke with patient and heart failure questions reviewed.  Transmission results reviewed.  Pt asymptomatic for fluid accumulation.  He requested to send report the following information to Dr Anne Fu about sx he has when working outside in cold weather.  He feels like someone is sitting on his chest with exertion and also gets SOB for the past month.       Optivol thoracic impedance suggesting normal fluid levels within the last month.      Prescribed:  Furosemide 20 mg Take 1 tablet (20 mg total) by mouth daily as needed   Labs: 08/06/2023 Creatinine 2.43, BUN 63, Potassium 4.5, Sodium 138, GFR 29 08/02/2023 Creatinine 2.30, BUN 61, Potassium 4.1, Sodium 141, GFR 30.40 05/16/2023 Creatinine 2.53, BUN 55, Potassium 4.6, Sodium 139, GFR 28 03/15/2023 Creatinine 2.42, BUN 49, Potassium 4.4, Sodium 144, GFR 29 A complete set of results can be found in Results Review.      Recommendations:    Phone note sent to Dr Elberta Fortis to address sx since Dr Anne Fu is out of the office this week.  No changes and encouraged to call if experiencing any fluid symptoms.    Follow-up plan: ICM clinic phone appointment on 12/16/2023.   91 day device clinic remote transmission 01/13/2024.     EP/Cardiology Office Visits:  Recall 03/09/2024 with Dr Anne Fu.  Next EP appt due 08/2024 with EP APP (no recall).     Copy of ICM check sent to Dr. Elberta Fortis.  3 month ICM trend: 01/12/2023.    12-14 Month ICM trend:     Karie Soda, RN 11/13/2023 12:53 PM

## 2023-11-13 NOTE — Telephone Encounter (Signed)
Per Camnitz secure message.  Pt should make appt with Dr Anne Fu for evaluation and use ED if needed.

## 2023-11-13 NOTE — Telephone Encounter (Signed)
Spoke with patient.  Advised of Dr Camnitz's recommendation to make appt with Dr Anne Fu for evaluation.  Again, advised to use ER if symptoms continue or get worse.

## 2023-11-13 NOTE — Telephone Encounter (Signed)
ICM call to patient for review of remote transmission fluid levels which are normal.    He reports having the following symptoms when he is working outside in the cold for the past month. feels like someone is sitting on his chest  SOB    The sx do not occur when he is inside the house, only outside with exertion during cold weather.  He asked if I could relay the information to Dr Anne Fu and wanted to make sure there is no problems with his heart.    Advised to use ER for evaluation if symptoms persist or changes.   Will send copy to Dr Elberta Fortis for review (Dr Anne Fu is out of the office this week).

## 2023-12-16 ENCOUNTER — Ambulatory Visit: Payer: 59 | Attending: Cardiology

## 2023-12-16 DIAGNOSIS — Z95810 Presence of automatic (implantable) cardiac defibrillator: Secondary | ICD-10-CM

## 2023-12-16 DIAGNOSIS — I5022 Chronic systolic (congestive) heart failure: Secondary | ICD-10-CM | POA: Diagnosis not present

## 2023-12-20 NOTE — Progress Notes (Signed)
EPIC Encounter for ICM Monitoring  Patient Name: Travis Tran is a 69 y.o. male Date: 12/20/2023 Primary Care Physican: Pcp, No Primary Cardiologist: [facility] Electrophysiologist: Camnitz 01/29/2023 Weight: 146 lbs     03/06/2023 Weight: 147-150 lbs    04/16/2023 Weight: 152 lbs   05/15/2023 Weight: 151-152 lbs 08/06/2023 Office Weight: 158 lbs 11/13/2023 Weight: 148-150 lbs 12/20/2023 Weight: 140 - 142 lbs (taking Ozempic)   Spoke with patient and heart failure questions reviewed.  Transmission results reviewed.  Pt asymptomatic for fluid accumulation.  Reports feeling well at this time and voices no complaints.     Optivol thoracic impedance suggesting normal fluid levels within the last month.      Prescribed:  Furosemide 20 mg Take 1 tablet (20 mg total) by mouth daily as needed   Labs: 11/04/2023 Creatinine 2.6,   BUN 46, Potassium 4.8, Sodium 138, GFR 26.97 08/06/2023 Creatinine 2.43, BUN 63, Potassium 4.5, Sodium 138, GFR 29 08/02/2023 Creatinine 2.30, BUN 61, Potassium 4.1, Sodium 141, GFR 30.40 05/16/2023 Creatinine 2.53, BUN 55, Potassium 4.6, Sodium 139, GFR 28 03/15/2023 Creatinine 2.42, BUN 49, Potassium 4.4, Sodium 144, GFR 29 A complete set of results can be found in Results Review.      Recommendations:    No changes and encouraged to call if experiencing any fluid symptoms.    Follow-up plan: ICM clinic phone appointment on 01/20/2024.   91 day device clinic remote transmission 01/13/2024.     EP/Cardiology Office Visits:  04/09/2024 with Dr Anne Fu.  04/09/2024 with Dr Elberta Fortis.     Copy of ICM check sent to Dr. Elberta Fortis.  3 month ICM trend: 12/16/2023.    12-14 Month ICM trend:     Karie Soda, RN 12/20/2023 12:08 PM

## 2024-01-13 ENCOUNTER — Ambulatory Visit (INDEPENDENT_AMBULATORY_CARE_PROVIDER_SITE_OTHER): Payer: 59

## 2024-01-13 DIAGNOSIS — I5022 Chronic systolic (congestive) heart failure: Secondary | ICD-10-CM

## 2024-01-13 DIAGNOSIS — I255 Ischemic cardiomyopathy: Secondary | ICD-10-CM

## 2024-01-14 LAB — CUP PACEART REMOTE DEVICE CHECK
Battery Remaining Longevity: 32 mo
Battery Voltage: 2.96 V
Brady Statistic RV Percent Paced: 0.01 %
Date Time Interrogation Session: 20250210104226
HighPow Impedance: 62 Ohm
Implantable Lead Connection Status: 753985
Implantable Lead Implant Date: 20170808
Implantable Lead Location: 753860
Implantable Pulse Generator Implant Date: 20170808
Lead Channel Impedance Value: 285 Ohm
Lead Channel Impedance Value: 361 Ohm
Lead Channel Pacing Threshold Amplitude: 0.625 V
Lead Channel Pacing Threshold Pulse Width: 0.4 ms
Lead Channel Sensing Intrinsic Amplitude: 15.125 mV
Lead Channel Sensing Intrinsic Amplitude: 15.125 mV
Lead Channel Setting Pacing Amplitude: 2.5 V
Lead Channel Setting Pacing Pulse Width: 0.4 ms
Lead Channel Setting Sensing Sensitivity: 0.3 mV
Zone Setting Status: 755011
Zone Setting Status: 755011

## 2024-01-20 ENCOUNTER — Ambulatory Visit: Payer: 59 | Attending: Cardiology

## 2024-01-20 DIAGNOSIS — Z95810 Presence of automatic (implantable) cardiac defibrillator: Secondary | ICD-10-CM

## 2024-01-20 DIAGNOSIS — I5022 Chronic systolic (congestive) heart failure: Secondary | ICD-10-CM

## 2024-01-23 NOTE — Progress Notes (Signed)
EPIC Encounter for ICM Monitoring  Patient Name: Travis Tran is a 68 y.o. male Date: 01/23/2024 Primary Care Physican: Pcp, No Primary Cardiologist: [facility] Electrophysiologist: Elberta Fortis 08/06/2023 Office Weight: 158 lbs 11/13/2023 Weight: 148-150 lbs 12/20/2023 Weight: 140 - 142 lbs (taking Ozempic) 01/23/2024 Weight: 139-140 lbs   Spoke with patient and heart failure questions reviewed.  Transmission results reviewed.  Pt asymptomatic for fluid accumulation.  Reports feeling well at this time and voices no complaints.     Optivol thoracic impedance suggesting normal fluid levels within the last month.      Prescribed:  Furosemide 20 mg Take 1 tablet (20 mg total) by mouth daily as needed.  2/20 reports he takes almost daily.    Labs: 01/10/2024 Creatinine 2.1,   BUN 42, Potassium 3.9, Sodium 143, GFR 33.59 11/04/2023 Creatinine 2.6,   BUN 46, Potassium 4.8, Sodium 138, GFR 26.97 08/06/2023 Creatinine 2.43, BUN 63, Potassium 4.5, Sodium 138, GFR 29 08/02/2023 Creatinine 2.30, BUN 61, Potassium 4.1, Sodium 141, GFR 30.40 05/16/2023 Creatinine 2.53, BUN 55, Potassium 4.6, Sodium 139, GFR 28 03/15/2023 Creatinine 2.42, BUN 49, Potassium 4.4, Sodium 144, GFR 29 A complete set of results can be found in Results Review.      Recommendations:   No changes and encouraged to call if experiencing any fluid symptoms.    Follow-up plan: ICM clinic phone appointment on 02/24/2024.   91 day device clinic remote transmission 04/13/2024.     EP/Cardiology Office Visits:  04/09/2024 with Dr Anne Fu.  04/09/2024 with Dr Elberta Fortis.     Copy of ICM check sent to Dr. Elberta Fortis.  3 month ICM trend: 01/20/2024.    12-14 Month ICM trend:     Karie Soda, RN 01/23/2024 10:12 AM

## 2024-02-20 NOTE — Progress Notes (Signed)
 Remote ICD transmission.

## 2024-02-20 NOTE — Addendum Note (Signed)
 Addended by: Geralyn Flash D on: 02/20/2024 09:48 AM   Modules accepted: Orders

## 2024-02-24 ENCOUNTER — Ambulatory Visit: Payer: 59 | Attending: Cardiology

## 2024-02-24 DIAGNOSIS — Z95810 Presence of automatic (implantable) cardiac defibrillator: Secondary | ICD-10-CM | POA: Diagnosis not present

## 2024-02-24 DIAGNOSIS — I5022 Chronic systolic (congestive) heart failure: Secondary | ICD-10-CM | POA: Diagnosis not present

## 2024-02-25 NOTE — Progress Notes (Signed)
 EPIC Encounter for ICM Monitoring  Patient Name: Travis Tran is a 68 y.o. male Date: 02/25/2024 Primary Care Physican: Pcp, No Primary Cardiologist: [facility] Electrophysiologist: Elberta Fortis 08/06/2023 Office Weight: 158 lbs 11/13/2023 Weight: 148-150 lbs 12/20/2023 Weight: 140 - 142 lbs (taking Ozempic) 01/23/2024 Weight: 139-140 lbs   Spoke with patient and heart failure questions reviewed.  Transmission results reviewed.  Pt asymptomatic for fluid accumulation.  Reports feeling well at this time and voices no complaints.       Optivol thoracic impedance suggesting possible fluid accumulation starting 3/13.      Prescribed:  Furosemide 20 mg Take 1 tablet (20 mg total) by mouth daily as needed. 02/25/2024 Pt taking Lasix 20 mg daily instead of PRN.    Labs: 01/10/2024 Creatinine 2.1,   BUN 42, Potassium 3.9, Sodium 143, GFR 33.59 11/04/2023 Creatinine 2.6,   BUN 46, Potassium 4.8, Sodium 138, GFR 26.97 08/06/2023 Creatinine 2.43, BUN 63, Potassium 4.5, Sodium 138, GFR 29 08/02/2023 Creatinine 2.30, BUN 61, Potassium 4.1, Sodium 141, GFR 30.40 05/16/2023 Creatinine 2.53, BUN 55, Potassium 4.6, Sodium 139, GFR 28 03/15/2023 Creatinine 2.42, BUN 49, Potassium 4.4, Sodium 144, GFR 29 A complete set of results can be found in Results Review.      Recommendations:   Recommendation to limit salt intake to 2000 mg daily and fluid intake to 64 oz daily.  Encouraged to call if experiencing any fluid symptoms.     Follow-up plan: ICM clinic phone appointment on 03/02/2024 to recheck fluid levels.   91 day device clinic remote transmission 04/13/2024.     EP/Cardiology Office Visits:  04/09/2024 with Dr Anne Fu.  04/09/2024 with Dr Elberta Fortis.     Copy of ICM check sent to Dr. Elberta Fortis.  3 month ICM trend: 02/24/2024.    12-14 Month ICM trend:     Karie Soda, RN 02/25/2024 8:52 AM

## 2024-02-27 ENCOUNTER — Encounter: Payer: Self-pay | Admitting: Cardiology

## 2024-03-02 ENCOUNTER — Ambulatory Visit: Attending: Cardiology

## 2024-03-02 DIAGNOSIS — Z95810 Presence of automatic (implantable) cardiac defibrillator: Secondary | ICD-10-CM

## 2024-03-02 DIAGNOSIS — I5022 Chronic systolic (congestive) heart failure: Secondary | ICD-10-CM

## 2024-03-02 NOTE — Progress Notes (Unsigned)
 EPIC Encounter for ICM Monitoring  Patient Name: Travis Tran is a 68 y.o. male Date: 03/02/2024 Primary Care Physican: Pcp, No Primary Cardiologist: [facility] Electrophysiologist: Elberta Fortis 08/06/2023 Office Weight: 158 lbs 11/13/2023 Weight: 148-150 lbs 12/20/2023 Weight: 140 - 142 lbs (taking Ozempic) 01/23/2024 Weight: 139-140 lbs   Spoke with patient and heart failure questions reviewed.  Transmission results reviewed.  Pt asymptomatic for fluid accumulation.  He reports his feet usually swell when he has fluid but he has no lower extremity swelling.       Optivol thoracic impedance suggesting possible fluid accumulation starting 3/13 and worsening on 3/28.   He ate [place] food twice in past week which may have contributed to show worsening fluid levels.   Prescribed:  Furosemide 20 mg Take 1 tablet (20 mg total) by mouth daily as needed. 03/02/2024 Pt takes Lasix 20 mg daily instead of PRN.    Labs: 01/10/2024 Creatinine 2.1,   BUN 42, Potassium 3.9, Sodium 143, GFR 33.59 11/04/2023 Creatinine 2.6,   BUN 46, Potassium 4.8, Sodium 138, GFR 26.97 08/06/2023 Creatinine 2.43, BUN 63, Potassium 4.5, Sodium 138, GFR 29 08/02/2023 Creatinine 2.30, BUN 61, Potassium 4.1, Sodium 141, GFR 30.40 05/16/2023 Creatinine 2.53, BUN 55, Potassium 4.6, Sodium 139, GFR 28 03/15/2023 Creatinine 2.42, BUN 49, Potassium 4.4, Sodium 144, GFR 29 A complete set of results can be found in Results Review.      Recommendations:   Pt self adjusted Lasix to 40 mg daily x 1 week but did eat [place] food twice since 3/24.  Advised to return to Lasix dosage he was previously taking, he takes 20 mg daily instead of PRN.       Follow-up plan: ICM clinic phone appointment on 03/09/2024 to recheck fluid levels.   91 day device clinic remote transmission 04/13/2024.     EP/Cardiology Office Visits:  04/09/2024 with Dr Anne Fu.  04/09/2024 with Dr Elberta Fortis.     Copy of ICM check sent to Dr. Elberta Fortis.  Copy sent to Dr Anne Fu for  review and recommendations if needed.    3 month ICM trend: 03/02/2024.    12-14 Month ICM trend:     Travis Soda, RN 03/02/2024 4:10 PM

## 2024-03-03 NOTE — Progress Notes (Signed)
 Spoke with patient.  Advised Dr. Anne Fu recommended to take Furosemide 20 mg daily and advised to limit salt intake.   Will recheck fluid levels next week.  Encouraged to call if he experiences any fluid symptoms.   Pt verbalized understanding and agreed with plan.   Advised to call back for any questions.

## 2024-03-03 NOTE — Progress Notes (Signed)
  Received: Alvan Dame, Veverly Fells, MD  Haleem Hanner, Josephine Igo, RN Agree

## 2024-03-09 ENCOUNTER — Encounter

## 2024-03-12 NOTE — Progress Notes (Signed)
 No ICM remote transmission received for 03/09/2024 and next ICM transmission scheduled for 03/30/2024.

## 2024-03-30 ENCOUNTER — Encounter

## 2024-04-03 NOTE — Progress Notes (Signed)
 No ICM remote transmission received for 03/30/2024 and next ICM transmission scheduled for 04/20/2024.

## 2024-04-08 NOTE — Progress Notes (Unsigned)
  Electrophysiology Office Note:   Date:  04/08/2024  ID:  Travis Tran, DOB 1956-06-16, MRN [id number]  Primary Cardiologist: Dorothye Gathers, MD Primary Heart Failure: None Electrophysiologist: Konica Stankowski Cortland Ding, MD  {Click to update primary MD,subspecialty MD or APP then REFRESH:1}    History of Present Illness:   Travis Tran is a 68 y.o. male with h/o chronic systolic heart failure, hypertension, coronary artery disease, atrial fibrillation seen today for routine electrophysiology followup.   Since last being seen in our clinic the patient reports doing ***.  he denies chest pain, palpitations, dyspnea, PND, orthopnea, nausea, vomiting, dizziness, syncope, edema, weight gain, or early satiety.   Review of systems complete and found to be negative unless listed in HPI.      EP Information / Studies Reviewed:    {EKGtoday:28818}      ICD Interrogation-  reviewed in detail today,  See PACEART report.  Device History: Medtronic Single Chamber ICD implanted 2017 for chronic systolic heart failure History of appropriate therapy: No History of AAD therapy: No   Risk Assessment/Calculations:    CHA2DS2-VASc Score =    {Click here to calculate score.  REFRESH note before signing. :1} This indicates a  % annual risk of stroke. The patient's score is based upon:              Physical Exam:   VS:  There were no vitals taken for this visit.   Wt Readings from Last 3 Encounters:  08/06/23 158 lb (71.7 kg)  08/06/23 158 lb (71.7 kg)  03/15/23 142 lb (64.4 kg)     GEN: Well nourished, well developed in no acute distress NECK: No JVD; No carotid bruits CARDIAC: {EPRHYTHM:28826}, no murmurs, rubs, gallops RESPIRATORY:  Clear to auscultation without rales, wheezing or rhonchi  ABDOMEN: Soft, non-tender, non-distended EXTREMITIES:  No edema; No deformity   ASSESSMENT AND PLAN:    Chronic systolic dysfunction s/p Medtronic single chamber ICD  euvolemic today Stable on an  appropriate medical regimen Normal ICD function See Pace Art report Sensing, threshold, impedance within normal limits Programming reviewed and appropriate for patient No changes today  2.  Hypertension:***  3.  Coronary artery disease: No current chest pain.  Plan per primary cardiology.  4.  Paroxysmal atrial fibrillation:***  Disposition:   Follow up with {EPPROVIDERS:28135} {EPFOLLOW NF:62130}   Signed, Reanne Nellums Cortland Ding, MD

## 2024-04-09 ENCOUNTER — Ambulatory Visit: Payer: 59 | Attending: Cardiology | Admitting: Cardiology

## 2024-04-09 ENCOUNTER — Encounter: Payer: Self-pay | Admitting: Cardiology

## 2024-04-09 VITALS — BP 129/76 | HR 87 | Ht 66.0 in | Wt 137.0 lb

## 2024-04-09 DIAGNOSIS — I2510 Atherosclerotic heart disease of native coronary artery without angina pectoris: Secondary | ICD-10-CM

## 2024-04-09 DIAGNOSIS — I493 Ventricular premature depolarization: Secondary | ICD-10-CM | POA: Diagnosis not present

## 2024-04-09 DIAGNOSIS — I480 Paroxysmal atrial fibrillation: Secondary | ICD-10-CM

## 2024-04-09 DIAGNOSIS — I5022 Chronic systolic (congestive) heart failure: Secondary | ICD-10-CM

## 2024-04-09 DIAGNOSIS — I10 Essential (primary) hypertension: Secondary | ICD-10-CM

## 2024-04-09 DIAGNOSIS — Z95810 Presence of automatic (implantable) cardiac defibrillator: Secondary | ICD-10-CM | POA: Diagnosis not present

## 2024-04-09 LAB — CUP PACEART INCLINIC DEVICE CHECK
Date Time Interrogation Session: 20250508154818
Implantable Lead Connection Status: 753985
Implantable Lead Implant Date: 20170808
Implantable Lead Location: 753860
Implantable Pulse Generator Implant Date: 20170808

## 2024-04-09 NOTE — Patient Instructions (Signed)

## 2024-04-09 NOTE — Progress Notes (Signed)
 Cardiology Office Note:  .   Date:  04/09/2024  ID:  Travis Tran, DOB February 10, 1956, MRN [id number] PCP: Juvenal Opoka  [facility] Providers Cardiologist:  Dorothye Gathers, MD Electrophysiologist:  Will Cortland Ding, MD     History of Present Illness: .   Travis Tran is a 68 y.o. male Discussed the use of AI scribe software for clinical note transcription with the patient, who gave verbal consent to proceed.  History of Present Illness Travis Tran is a 68 year old male with chronic systolic heart failure who presents for a follow-up visit.  He has a history of ischemic cardiomyopathy and underwent CABG x5. His ICD, implanted in 2017, has not fired or beeped since implantation. His last echocardiogram in 2022 showed an ejection fraction of 40-45%, an improvement from a previous low of 10% when he first experienced heart failure eight years ago.  He is currently on aspirin  81 mg, atorvastatin  80 mg daily, carvedilol  6.25 mg twice daily, clopidogrel  75 mg daily, Entresto  97/103 mg twice daily, Zetia 10 mg daily, and furosemide  20 mg as needed. He was on Farxiga 10 mg daily but stopped due to side effects, including a rash. His kidney function improved after discontinuing Farxiga, with creatinine levels improving from 2.4 in September 2024 to better levels as per his nephrologist.  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 He also has chronic kidney disease stage 3B, with creatinine levels being monitored by his nephrologist.  He is on Ozempic for diabetes management, which has helped maintain his blood sugars at normal levels and reduced his appetite, resulting in a 20-pound weight loss since November. His A1c improved from 7.4 to 6.1 as of last week.  He experiences occasional chest tightness during physical exertion, such as yard work or increased treadmill speed, which resolves with rest.  He mentions his  dextrocardia, noting that his heart and other internals are reversed, which has been confirmed by chest x-ray and EKG findings.     Studies Reviewed: Aaron Aas    EKG Interpretation Date/Time:  Thursday Apr 09 2024 08:45:45 EDT Ventricular Rate:  87 PR Interval:  180 QRS Duration:  90 QT Interval:  382 QTC Calculation: 459 R Axis:   -37  Text Interpretation: Sinus rhythm with frequent Premature ventricular complexes Left axis deviation Pulmonary disease pattern Septal infarct , age undetermined ST & T wave abnormality, consider anterior ischemia When compared with ECG of 06-Aug-2023 10:01, Septal infarct is now Present T wave inversion now evident in Anterior leads Nonspecific T wave abnormality has replaced inverted T waves in Lateral leads Patient has dextrocardia Confirmed by Dorothye Gathers ([id number]) on 04/09/2024 9:35:18 AM    Results LABS Creatinine: 2.4 (08/2023) HbA1c: 6.1 (04/2024) Lipid panel: LDL 48 (03/2023)  RADIOLOGY Chest x-ray: Dextrocardia (2017)  DIAGNOSTIC REPORTS Echocardiogram: EF 40-45%, mild mitral regurgitation (2022)  Risk Assessment/Calculations:            Physical Exam:   VS:  BP 129/76   Pulse 87   Ht 5\' 6"  (1.676 m)   Wt 137 lb (62.1 kg)   SpO2 97%   BMI 22.11 kg/m    Wt Readings from Last 3 Encounters:  04/09/24 137 lb (62.1 kg)  04/09/24 137 lb (62.1 kg)  08/06/23 158 lb (71.7 kg)    GEN: Thin, well developed in no acute distress NECK: No JVD; No carotid bruits CARDIAC: RRR, no murmurs, no rubs, no  gallops RESPIRATORY:  Clear to auscultation without rales, wheezing or rhonchi  ABDOMEN: Soft, non-tender, non-distended EXTREMITIES:  No edema; No deformity   ASSESSMENT AND PLAN: .    Assessment and Plan Assessment & Plan Chronic systolic heart failure Chronic systolic heart failure with a history of reduced ejection fraction, now improved to 40-45% as of 2022. Currently on Entresto  and carvedilol . Farxiga discontinued due to rash, which  improved kidney function. Weight loss of 20 pounds since November and A1c improved to 6.1 as of last week. - Discontinue Farxiga due to rash and improved kidney function. - Continue Entresto  and carvedilol .  Ischemic cardiomyopathy Ischemic cardiomyopathy with significant improvement in ejection fraction, managed with current heart failure regimen. - Continue current heart failure management.  Coronary artery disease with angina Coronary artery disease with angina symptoms. Experiences chest tightness with exertion, resolving with rest. History of bypass surgery. Managed with carvedilol . Prefers to monitor symptoms and avoid additional medications unless necessary. - Monitor angina symptoms and consider isosorbide  mononitrate (Imdur ) if symptoms worsen. - Increase carvedilol  dosage if needed. - Consider stress test if symptoms escalate.  Chronic kidney disease, stage 3B Chronic kidney disease stage 3B with recent improvement in creatinine levels, previously 2.4 in September 2024. Improvement noted after discontinuation of Farxiga. Follow-up with nephrologist next week. - Continue monitoring kidney function with nephrologist.  Dextrocardia Dextrocardia with situs inversus. Requires special consideration during EKG and imaging procedures. Aware and ensures proper setup during medical visits. - Ensure proper EKG setup and imaging techniques are used.           Signed, Dorothye Gathers, MD

## 2024-04-13 ENCOUNTER — Ambulatory Visit: Payer: Medicaid Other

## 2024-04-13 DIAGNOSIS — I255 Ischemic cardiomyopathy: Secondary | ICD-10-CM

## 2024-04-13 DIAGNOSIS — I5022 Chronic systolic (congestive) heart failure: Secondary | ICD-10-CM

## 2024-04-14 LAB — CUP PACEART REMOTE DEVICE CHECK
Battery Remaining Longevity: 30 mo
Battery Voltage: 2.94 V
Brady Statistic RV Percent Paced: 0 %
Date Time Interrogation Session: 20250512091808
HighPow Impedance: 59 Ohm
Implantable Lead Connection Status: 753985
Implantable Lead Implant Date: 20170808
Implantable Lead Location: 753860
Implantable Pulse Generator Implant Date: 20170808
Lead Channel Impedance Value: 285 Ohm
Lead Channel Impedance Value: 342 Ohm
Lead Channel Pacing Threshold Amplitude: 0.625 V
Lead Channel Pacing Threshold Pulse Width: 0.4 ms
Lead Channel Sensing Intrinsic Amplitude: 9.875 mV
Lead Channel Sensing Intrinsic Amplitude: 9.875 mV
Lead Channel Setting Pacing Amplitude: 2.5 V
Lead Channel Setting Pacing Pulse Width: 0.4 ms
Lead Channel Setting Sensing Sensitivity: 0.3 mV
Zone Setting Status: 755011
Zone Setting Status: 755011

## 2024-04-15 ENCOUNTER — Ambulatory Visit: Payer: Self-pay | Admitting: Cardiology

## 2024-04-17 ENCOUNTER — Encounter: Payer: Self-pay | Admitting: Cardiology

## 2024-04-20 ENCOUNTER — Ambulatory Visit: Attending: Cardiology

## 2024-04-20 ENCOUNTER — Other Ambulatory Visit (HOSPITAL_COMMUNITY): Payer: Self-pay

## 2024-04-20 ENCOUNTER — Other Ambulatory Visit: Payer: Self-pay

## 2024-04-20 DIAGNOSIS — I5022 Chronic systolic (congestive) heart failure: Secondary | ICD-10-CM | POA: Diagnosis not present

## 2024-04-20 DIAGNOSIS — Z95810 Presence of automatic (implantable) cardiac defibrillator: Secondary | ICD-10-CM

## 2024-04-20 MED ORDER — CARVEDILOL 6.25 MG PO TABS
6.2500 mg | ORAL_TABLET | Freq: Two times a day (BID) | ORAL | 3 refills | Status: DC
  Filled 2024-04-20: qty 180, 90d supply, fill #0

## 2024-04-21 ENCOUNTER — Other Ambulatory Visit (HOSPITAL_COMMUNITY): Payer: Self-pay

## 2024-04-21 ENCOUNTER — Other Ambulatory Visit: Payer: Self-pay

## 2024-04-21 MED ORDER — CARVEDILOL 6.25 MG PO TABS: 6.2500 mg | ORAL_TABLET | Freq: Two times a day (BID) | ORAL | 3 refills | Status: DC

## 2024-04-21 NOTE — Progress Notes (Signed)
 EPIC Encounter for ICM Monitoring  Patient Name: Travis Tran is a 68 y.o. male Date: 04/21/2024 Primary Care Physican: Pcp, No Primary Cardiologist: Renna Cary Electrophysiologist: Lawana Pray Nephrologist: Annabella Barr at [facility] 08/06/2023 Office Weight: 158 lbs 11/13/2023 Weight: 148-150 lbs 12/20/2023 Weight: 140 - 142 lbs (taking Ozempic) 01/23/2024 Weight: 139-140 lbs 04/09/2024 Office Weight: 137 lbs   Spoke with patient and heart failure questions reviewed.  Transmission results reviewed.  Pt asymptomatic for fluid accumulation. He's had a cold for past week and eating a lot of canned soups.   Per 5/14 Kidney physician office notes he would like to add Kerendia which could replace Lasix  and pt sent Dr Renna Cary a mychart message asking if he should add this medication.     Diet:  Is not strict with limiting salt intake  Optivol thoracic impedance suggesting possible fluid accumulation starting 5/5 and worsening on 5/17.      Prescribed:  Furosemide  20 mg Take 1 tablet (20 mg total) by mouth daily as needed. 04/21/2024 Pt takes Lasix  20 mg daily instead of PRN.    Labs: 04/06/2024 Creatinine 2.2,   BUN 35, Potassium 4.4, Sodium 143, GFR 32.98 01/10/2024 Creatinine 2.1,   BUN 42, Potassium 3.9, Sodium 143, GFR 33.59 11/04/2023 Creatinine 2.6,   BUN 46, Potassium 4.8, Sodium 138, GFR 26.97 08/06/2023 Creatinine 2.43, BUN 63, Potassium 4.5, Sodium 138, GFR 29 08/02/2023 Creatinine 2.30, BUN 61, Potassium 4.1, Sodium 141, GFR 30.40 05/16/2023 Creatinine 2.53, BUN 55, Potassium 4.6, Sodium 139, GFR 28 03/15/2023 Creatinine 2.42, BUN 49, Potassium 4.4, Sodium 144, GFR 29 A complete set of results can be found in Results Review.      Recommendations:   Encouraged to decease salt intake and avoid canned soups if possible.  Pt has also sent a mychart message to Dr Renna Cary regarding advice on starting Kerendia as recommended by Nephrologist.     Copy sent to Dr Renna Cary for review and  recommendations if needed.      Follow-up plan: ICM clinic phone appointment on 04/28/2024 to recheck fluid levels.   91 day device clinic remote transmission 07/13/2024.     EP/Cardiology Office Visits:  Recall 04/04/2025 with Dr Renna Cary. Recall needed for 2026 with Dr Lawana Pray.     Copy of ICM check sent to Dr. Lawana Pray.    3 month ICM trend: 04/20/2024.    12-14 Month ICM trend:     Almyra Jain, RN 04/21/2024 8:26 AM

## 2024-04-22 MED ORDER — FUROSEMIDE 20 MG PO TABS: 40.0000 mg | ORAL_TABLET | Freq: Every day | ORAL | 2 refills | Status: DC

## 2024-04-22 NOTE — Progress Notes (Signed)
 Spoke with patient.  Advised Dr. Renna Cary recommended to take Furosemide  20 mg 2 tablets (40 mg total) by mouth once a day.  Advised to have labs drawn in 2 weeks and he can have it drawn at [facility] at [address] in [place].   Prescription updated and he requested refill at preferred pharmacy, [facility] in [place].  BMET ordered placed and released for [facility] and should be drawn on 05/06/2024.  Advised to return to Lasix  20 mg dosage if he has any side effects of dizziness, lightheadedness, feeling dehydrated and to call back regarding changes.  Pt verbalized understanding and agreed with plan.   Advised to call back or contact Dr Renna Cary office for any questions.

## 2024-04-22 NOTE — Progress Notes (Signed)
  Received: Tommi Fraise, Myrtle Atta, MD  Mallorie Norrod, Myrtie Atkinson, RN; Lei Pump, MD Thanks. Let's increase to 40mg  daily. Check BMET in 2 weeks Dorothye Gathers, MD

## 2024-04-23 ENCOUNTER — Ambulatory Visit: Payer: Self-pay

## 2024-04-23 LAB — BASIC METABOLIC PANEL WITH GFR
BUN/Creatinine Ratio: 15 (ref 10–24)
BUN: 30 mg/dL — ABNORMAL HIGH (ref 8–27)
CO2: 23 mmol/L (ref 20–29)
Calcium: 9.3 mg/dL (ref 8.6–10.2)
Chloride: 100 mmol/L (ref 96–106)
Creatinine, Ser: 2.01 mg/dL — ABNORMAL HIGH (ref 0.76–1.27)
Glucose: 118 mg/dL — ABNORMAL HIGH (ref 70–99)
Potassium: 4.2 mmol/L (ref 3.5–5.2)
Sodium: 139 mmol/L (ref 134–144)
eGFR: 36 mL/min/{1.73_m2} — ABNORMAL LOW (ref >59)

## 2024-04-28 ENCOUNTER — Ambulatory Visit: Attending: Cardiology

## 2024-04-28 DIAGNOSIS — I5022 Chronic systolic (congestive) heart failure: Secondary | ICD-10-CM

## 2024-04-28 DIAGNOSIS — Z95810 Presence of automatic (implantable) cardiac defibrillator: Secondary | ICD-10-CM

## 2024-04-29 NOTE — Progress Notes (Signed)
 EPIC Encounter for ICM Monitoring  Patient Name: Travis Tran is a 68 y.o. male Date: 04/29/2024 Primary Care Physican: Pcp, No Primary Cardiologist: Renna Cary Electrophysiologist: Lawana Pray Nephrologist: Annabella Barr at [facility] 08/06/2023 Office Weight: 158 lbs 11/13/2023 Weight: 148-150 lbs 12/20/2023 Weight: 140 - 142 lbs (taking Ozempic) 01/23/2024 Weight: 139-140 lbs 04/09/2024 Office Weight: 137 lbs   Spoke with patient and heart failure questions reviewed.  Transmission results reviewed.  Pt asymptomatic for fluid accumulation.    Misunderstanding on taking the increase Lasix  dosage of 40 mg for 2 weeks and then have labs drawn (6/4).  He took only a couple of days of Lasix  40 mg since 5/21 and had labs drawn on 5/21.      Diet:  Is not strict with limiting salt intake   Optivol thoracic impedance suggesting fluid levels returned to baseline 5/20 after Lasix  dosage increased but trending slightly below baseline again on 5/22.   Prescribed:  Furosemide  20 mg Take 2 tablet (40 mg total) by mouth daily (increased on 5/21)    Labs: 04/06/2024 Creatinine 2.2,   BUN 35, Potassium 4.4, Sodium 143, GFR 32.98 01/10/2024 Creatinine 2.1,   BUN 42, Potassium 3.9, Sodium 143, GFR 33.59 11/04/2023 Creatinine 2.6,   BUN 46, Potassium 4.8, Sodium 138, GFR 26.97 08/06/2023 Creatinine 2.43, BUN 63, Potassium 4.5, Sodium 138, GFR 29 08/02/2023 Creatinine 2.30, BUN 61, Potassium 4.1, Sodium 141, GFR 30.40 05/16/2023 Creatinine 2.53, BUN 55, Potassium 4.6, Sodium 139, GFR 28 03/15/2023 Creatinine 2.42, BUN 49, Potassium 4.4, Sodium 144, GFR 29 A complete set of results can be found in Results Review.      Recommendations:  Pt misunderstood 5/21 recommendations to increase Lasix  to 40 mg daily and BMET in 2 weeks. Instead he took Lasix  40 mg x 2 days (instead of daily) and had labs drawn the same day 5/21 (instead of 6/4).      Pt will continue Lasix  20 mg daily until 6/9 ICM remote  check.  If still has fluid accumulation on 6/9 he will increase Lasix  to 40 mg daily and then have labs drawn 2 weeks later.      Follow-up plan: ICM clinic phone appointment on 05/11/2024 to recheck fluid levels.   91 day device clinic remote transmission 07/13/2024.     EP/Cardiology Office Visits:  Recall 04/04/2025 with Dr Renna Cary. Recall needed for 2026 with Dr Lawana Pray.     Copy of ICM check sent to Dr. Lawana Pray and Dr Renna Cary as Arlie Lain.     3 month ICM trend: 04/29/2024.    12-14 Month ICM trend:     Almyra Jain, RN 04/29/2024 2:14 PM

## 2024-05-11 ENCOUNTER — Ambulatory Visit: Attending: Cardiology

## 2024-05-11 DIAGNOSIS — Z95810 Presence of automatic (implantable) cardiac defibrillator: Secondary | ICD-10-CM

## 2024-05-11 DIAGNOSIS — I5022 Chronic systolic (congestive) heart failure: Secondary | ICD-10-CM

## 2024-05-12 ENCOUNTER — Encounter: Payer: Self-pay | Admitting: Cardiology

## 2024-05-13 NOTE — Progress Notes (Signed)
 EPIC Encounter for ICM Monitoring  Patient Name: Travis Tran is a 69 y.o. male Date: 05/13/2024 Primary Care Physican: Pcp, No Primary Cardiologist: Renna Cary Electrophysiologist: Lawana Pray Nephrologist: Annabella Barr at [facility] 08/06/2023 Office Weight: 158 lbs 11/13/2023 Weight: 148-150 lbs 12/20/2023 Weight: 140 - 142 lbs (taking Ozempic) 01/23/2024 Weight: 139-140 lbs 04/09/2024 Office Weight: 137 lbs   Spoke with patient and heart failure questions reviewed.  Transmission results reviewed.  Pt asymptomatic for fluid accumulation.  He continues to take Lasix  20 mg for now.     Diet:  Is not strict with limiting salt intake   Optivol thoracic impedance suggesting fluid levels returned to baseline 6/9 (still taking Lasix  20 mg daily instead of increasing to 40 mg on 5/21).   Prescribed:  Furosemide  20 mg Take 2 tablet (40 mg total) by mouth daily (increased on 5/21)    Labs: 04/06/2024 Creatinine 2.2,   BUN 35, Potassium 4.4, Sodium 143, GFR 32.98 01/10/2024 Creatinine 2.1,   BUN 42, Potassium 3.9, Sodium 143, GFR 33.59 11/04/2023 Creatinine 2.6,   BUN 46, Potassium 4.8, Sodium 138, GFR 26.97 08/06/2023 Creatinine 2.43, BUN 63, Potassium 4.5, Sodium 138, GFR 29 08/02/2023 Creatinine 2.30, BUN 61, Potassium 4.1, Sodium 141, GFR 30.40 05/16/2023 Creatinine 2.53, BUN 55, Potassium 4.6, Sodium 139, GFR 28 03/15/2023 Creatinine 2.42, BUN 49, Potassium 4.4, Sodium 144, GFR 29 A complete set of results can be found in Results Review.      Recommendations:  He will continue to take Lasix  20 mg for now and his Kidney physician has sent in a script for Ethelene Herald and wants to stop Lasix .  Pt will send a my chart message to Dr Renna Cary to ask if should stop Lasix  as recommended by kidney doctor.      Follow-up plan: ICM clinic phone appointment on 06/15/2024.   91 day device clinic remote transmission 07/13/2024.     EP/Cardiology Office Visits:  Recall 04/04/2025 with Dr Renna Cary. Recall  04/22/2025 with EP APP.     Copy of ICM check sent to Dr. Lawana Pray.  3 month ICM trend: 05/11/2024.    12-14 Month ICM trend:     Almyra Jain, RN 05/13/2024 10:41 AM

## 2024-05-29 ENCOUNTER — Other Ambulatory Visit: Payer: Self-pay | Admitting: Cardiology

## 2024-06-01 ENCOUNTER — Other Ambulatory Visit: Payer: Self-pay

## 2024-06-01 ENCOUNTER — Encounter: Payer: Self-pay | Admitting: Cardiology

## 2024-06-01 DIAGNOSIS — I25709 Atherosclerosis of coronary artery bypass graft(s), unspecified, with unspecified angina pectoris: Secondary | ICD-10-CM

## 2024-06-01 MED ORDER — ATORVASTATIN CALCIUM 80 MG PO TABS: ORAL_TABLET | ORAL | 3 refills | Status: AC

## 2024-06-01 NOTE — Addendum Note (Signed)
 Addended by: TAWNI DRILLING D on: 06/01/2024 12:52 PM   Modules accepted: Orders

## 2024-06-01 NOTE — Progress Notes (Signed)
 Remote ICD transmission.

## 2024-06-02 NOTE — Telephone Encounter (Signed)
 Please see the MyChart message reply(ies) for my assessment and plan.    I have reviewed the results of your lower extremity arterial scan (within the media folder, faxed in from outside hospital). There is plaque present but no significant flow limitation noted.  Overall reassuring. My recommendation would be to continue with your aspirin , Zetia, atorvastatin  for continued plaque stabilization.  This patient gave consent for this Medical Advice Message and is aware that it may result in a bill to [employer], as well as the possibility of receiving a bill for a co-payment or deductible. They are an established patient, but are not seeking medical advice exclusively about a problem treated during an in person or video visit in the last seven days. I did not recommend an in person or video visit within seven days of my reply.    I spent a total of 9 minutes cumulative time within 7 days through [employer].  Oneil Parchment, MD

## 2024-06-05 ENCOUNTER — Other Ambulatory Visit: Payer: Self-pay | Admitting: Cardiology

## 2024-06-22 ENCOUNTER — Encounter: Payer: Self-pay | Admitting: Cardiology

## 2024-06-25 MED ORDER — FUROSEMIDE 20 MG PO TABS: 40.0000 mg | ORAL_TABLET | Freq: Every day | ORAL | 2 refills | Status: AC

## 2024-06-29 ENCOUNTER — Ambulatory Visit: Attending: Cardiology

## 2024-06-29 DIAGNOSIS — I5022 Chronic systolic (congestive) heart failure: Secondary | ICD-10-CM

## 2024-06-29 DIAGNOSIS — Z95810 Presence of automatic (implantable) cardiac defibrillator: Secondary | ICD-10-CM | POA: Diagnosis not present

## 2024-07-03 NOTE — Progress Notes (Signed)
 EPIC Encounter for ICM Monitoring  Patient Name: Travis Tran is a 68 y.o. male Date: 07/03/2024 Primary Care Physican: Pcp, No Primary Cardiologist: Jeffrie Electrophysiologist: Inocencio Nephrologist: Greta at [facility] 08/06/2023 Office Weight: 158 lbs 11/13/2023 Weight: 148-150 lbs 12/20/2023 Weight: 140 - 142 lbs (taking Ozempic) 01/23/2024 Weight: 139-140 lbs 04/09/2024 Office Weight: 137 lbs 07/03/2024 Home Weight: 131-133 lbs   Spoke with patient and heart failure questions reviewed.  Transmission results reviewed.  Pt asymptomatic for fluid accumulation.  Reports feeling well at this time and voices no complaints.     Diet:  Is not strict with limiting salt intake   Optivol thoracic impedance suggesting intermittent days with possible fluid accumulation within the last month.   Prescribed:  Furosemide  20 mg Take 2 tablet(s) (40 mg total) by mouth daily.    Labs: 04/22/2024 Creatinine 2.01, BUN 30, Potassium 4.2, Sodium 139, GFR 36 04/06/2024 Creatinine 2.2,   BUN 35, Potassium 4.4, Sodium 143, GFR 32.98 01/10/2024 Creatinine 2.1,   BUN 42, Potassium 3.9, Sodium 143, GFR 33.59 A complete set of results can be found in Results Review.      Recommendations:  No changes and encouraged to call if experiencing any fluid symptoms.    Follow-up plan: ICM clinic phone appointment on 08/10/2024.   91 day device clinic remote transmission 07/13/2024.     EP/Cardiology Office Visits:  Recall 04/04/2025 with Dr Jeffrie. Recall 04/22/2025 with EP APP.     Copy of ICM check sent to Dr. Inocencio.  3 month ICM trend: 06/29/2024.    12-14 Month ICM trend:     Travis GORMAN Garner, RN 07/03/2024 10:12 AM

## 2024-07-13 ENCOUNTER — Ambulatory Visit: Payer: Medicaid Other

## 2024-07-13 DIAGNOSIS — I5022 Chronic systolic (congestive) heart failure: Secondary | ICD-10-CM

## 2024-07-14 ENCOUNTER — Ambulatory Visit: Payer: Self-pay | Admitting: Cardiology

## 2024-07-14 LAB — CUP PACEART REMOTE DEVICE CHECK
Battery Remaining Longevity: 26 mo
Battery Voltage: 2.94 V
Brady Statistic RV Percent Paced: 0.01 %
Date Time Interrogation Session: 20250811052507
HighPow Impedance: 65 Ohm
Implantable Lead Connection Status: 753985
Implantable Lead Implant Date: 20170808
Implantable Lead Location: 753860
Implantable Pulse Generator Implant Date: 20170808
Lead Channel Impedance Value: 247 Ohm
Lead Channel Impedance Value: 342 Ohm
Lead Channel Pacing Threshold Amplitude: 0.75 V
Lead Channel Pacing Threshold Pulse Width: 0.4 ms
Lead Channel Sensing Intrinsic Amplitude: 11.5 mV
Lead Channel Sensing Intrinsic Amplitude: 11.5 mV
Lead Channel Setting Pacing Amplitude: 2.5 V
Lead Channel Setting Pacing Pulse Width: 0.4 ms
Lead Channel Setting Sensing Sensitivity: 0.3 mV
Zone Setting Status: 755011
Zone Setting Status: 755011

## 2024-08-10 ENCOUNTER — Ambulatory Visit: Attending: Cardiology

## 2024-08-10 DIAGNOSIS — I5022 Chronic systolic (congestive) heart failure: Secondary | ICD-10-CM

## 2024-08-10 DIAGNOSIS — Z95810 Presence of automatic (implantable) cardiac defibrillator: Secondary | ICD-10-CM | POA: Diagnosis not present

## 2024-08-14 NOTE — Progress Notes (Signed)
 EPIC Encounter for ICM Monitoring  Patient Name: Travis Tran is a 68 y.o. male Date: 08/14/2024 Primary Care Physican: Pcp, No Primary Cardiologist: Jeffrie Electrophysiologist: Inocencio Nephrologist: Greta at [facility] 08/06/2023 Office Weight: 158 lbs 11/13/2023 Weight: 148-150 lbs 12/20/2023 Weight: 140 - 142 lbs (taking Ozempic) 01/23/2024 Weight: 139-140 lbs 04/09/2024 Office Weight: 137 lbs 07/03/2024 Home Weight: 131-133 lbs 08/14/2024 Weight: 130-135 lbs   Spoke with patient and heart failure questions reviewed.  Transmission results reviewed.  Pt asymptomatic for fluid accumulation.  Reports feeling well at this time and voices no complaints.     Diet:  Is not strict with limiting salt intake   Optivol thoracic impedance suggesting normal fluid levels within the last month.   Prescribed:  Furosemide  20 mg Take 2 tablet(s) (40 mg total) by mouth daily.    Labs: 04/22/2024 Creatinine 2.01, BUN 30, Potassium 4.2, Sodium 139, GFR 36 04/06/2024 Creatinine 2.2,   BUN 35, Potassium 4.4, Sodium 143, GFR 32.98 01/10/2024 Creatinine 2.1,   BUN 42, Potassium 3.9, Sodium 143, GFR 33.59 A complete set of results can be found in Results Review.      Recommendations:  No changes and encouraged to call if experiencing any fluid symptoms.    Follow-up plan: ICM clinic phone appointment on 09/21/2024.   91 day device clinic remote transmission 10/12/2024.     EP/Cardiology Office Visits:  Recall 04/04/2025 with Dr Jeffrie.  Recall 04/22/2025 with EP APP.     Copy of ICM check sent to Dr. Inocencio.  3 month ICM trend: 08/10/2024.    12-14 Month ICM trend:     Mitzie GORMAN Garner, RN 08/14/2024 9:02 AM

## 2024-08-28 NOTE — Progress Notes (Signed)
Remote ICD Transmission.

## 2024-09-21 ENCOUNTER — Ambulatory Visit: Attending: Cardiology

## 2024-09-21 DIAGNOSIS — I5022 Chronic systolic (congestive) heart failure: Secondary | ICD-10-CM | POA: Diagnosis not present

## 2024-09-21 DIAGNOSIS — Z95810 Presence of automatic (implantable) cardiac defibrillator: Secondary | ICD-10-CM | POA: Diagnosis not present

## 2024-09-25 ENCOUNTER — Telehealth: Payer: Self-pay

## 2024-09-25 NOTE — Telephone Encounter (Signed)
Remote ICM transmission received.  Attempted call to patient regarding ICM remote transmission and no answer.  Mail box is full. 

## 2024-09-25 NOTE — Progress Notes (Signed)
 EPIC Encounter for ICM Monitoring  Patient Name: Travis Tran is a 68 y.o. male Date: 09/25/2024 Primary Care Physican: Pcp, No Primary Cardiologist: Jeffrie Electrophysiologist: Inocencio Nephrologist: Greta at [facility] 08/06/2023 Office Weight: 158 lbs 11/13/2023 Weight: 148-150 lbs 12/20/2023 Weight: 140 - 142 lbs (taking Ozempic) 01/23/2024 Weight: 139-140 lbs 04/09/2024 Office Weight: 137 lbs 07/03/2024 Home Weight: 131-133 lbs 08/14/2024 Weight: 130-135 lbs   Attempted call to patient and unable to reach.  Transmission results reviewed.    Diet:  Is not strict with limiting salt intake   Since 08/10/2024 ICM Remote Transmission: Optivol thoracic impedance suggesting intermittent days with possible fluid accumulation.   Prescribed:  Furosemide  20 mg Take 2 tablet(s) (40 mg total) by mouth daily.    Labs: 08/17/2024 Creatinine 2,       BUN 42, Potassium 4.1, Sodium 147, GFR 36.09 04/22/2024 Creatinine 2.01, BUN 30, Potassium 4.2, Sodium 139, GFR 36 04/06/2024 Creatinine 2.2,   BUN 35, Potassium 4.4, Sodium 143, GFR 32.98 01/10/2024 Creatinine 2.1,   BUN 42, Potassium 3.9, Sodium 143, GFR 33.59 A complete set of results can be found in Results Review.      Recommendations: Unable to reach.      Follow-up plan: ICM clinic phone appointment on 10/26/2024.   91 day device clinic remote transmission 10/12/2024.     EP/Cardiology Office Visits:  Recall 04/04/2025 with Dr Jeffrie.  Recall 04/22/2025 with EP APP.     Copy of ICM check sent to Dr. Inocencio.  Remote monitoring is medically necessary for Heart Failure Management.    Daily Thoracic Impedance ICM trend: 06/22/2024 through 09/21/2024.    12-14 Month Thoracic Impedance ICM trend:     Mitzie GORMAN Garner, RN 09/25/2024 2:01 PM

## 2024-10-12 ENCOUNTER — Other Ambulatory Visit: Payer: Self-pay

## 2024-10-12 ENCOUNTER — Telehealth: Payer: Self-pay

## 2024-10-12 ENCOUNTER — Ambulatory Visit: Payer: Medicaid Other

## 2024-10-12 DIAGNOSIS — I472 Ventricular tachycardia, unspecified: Secondary | ICD-10-CM

## 2024-10-12 DIAGNOSIS — I5022 Chronic systolic (congestive) heart failure: Secondary | ICD-10-CM

## 2024-10-12 NOTE — Telephone Encounter (Addendum)
 Alert received:  Alert remote transmission: Shock 1 VF event on 11/9 at 1640 x 17 sec, V-rates 272 bpm treated with a single ICD shock.   Rapid WCT with variable R wave morphology.  Pt with history of CSHF.  On Carvedilol  6.25 mg PO BID.    Outreach made to Pt.  Per Pt he was getting the Christmas decorations out when he started to feel dizzy.  He sat down in a chair and then lost consciousness.  He states his sister woke him up and he felt fine after.   He states he takes carvedilol  as prescribed.  Last lab work on record is from May 2025 and WNL.  Advised Pt per DMV guidelines he cannot drive for 6 months.  He indicates understanding.  Will forward to Dr. Inocencio for review and advisement.

## 2024-10-12 NOTE — Telephone Encounter (Signed)
 Reviewed with Dr. Inocencio.  Per Dr. Sherra lab work and follow up with EP APP next available.  Pt scheduled to see AT October 20, 2024 at 9:00 am.  Directions given to office.  Pt will get lab work locally as he lives 3 hours away.    Await further needs.

## 2024-10-13 LAB — CUP PACEART REMOTE DEVICE CHECK
Battery Remaining Longevity: 25 mo
Battery Voltage: 2.81 V
Brady Statistic RV Percent Paced: 0.02 %
Date Time Interrogation Session: 20251110022729
HighPow Impedance: 66 Ohm
Implantable Lead Connection Status: 753985
Implantable Lead Implant Date: 20170808
Implantable Lead Location: 753860
Implantable Pulse Generator Implant Date: 20170808
Lead Channel Impedance Value: 285 Ohm
Lead Channel Impedance Value: 342 Ohm
Lead Channel Pacing Threshold Amplitude: 0.625 V
Lead Channel Pacing Threshold Pulse Width: 0.4 ms
Lead Channel Sensing Intrinsic Amplitude: 11.25 mV
Lead Channel Sensing Intrinsic Amplitude: 11.25 mV
Lead Channel Setting Pacing Amplitude: 2.5 V
Lead Channel Setting Pacing Pulse Width: 0.4 ms
Lead Channel Setting Sensing Sensitivity: 0.3 mV
Zone Setting Status: 755011
Zone Setting Status: 755011

## 2024-10-15 NOTE — Progress Notes (Signed)
 Remote ICD Transmission

## 2024-10-17 LAB — COMPREHENSIVE METABOLIC PANEL WITH GFR
ALT: 12 IU/L (ref 0–44)
AST: 16 IU/L (ref 0–40)
Albumin: 4 g/dL (ref 3.9–4.9)
Alkaline Phosphatase: 49 IU/L (ref 47–123)
BUN/Creatinine Ratio: 16 (ref 10–24)
BUN: 29 mg/dL — ABNORMAL HIGH (ref 8–27)
Bilirubin Total: 0.5 mg/dL (ref 0.0–1.2)
CO2: 25 mmol/L (ref 20–29)
Calcium: 9.5 mg/dL (ref 8.6–10.2)
Chloride: 104 mmol/L (ref 96–106)
Creatinine, Ser: 1.85 mg/dL — ABNORMAL HIGH (ref 0.76–1.27)
Globulin, Total: 2.1 g/dL (ref 1.5–4.5)
Glucose: 135 mg/dL — ABNORMAL HIGH (ref 70–99)
Potassium: 4.7 mmol/L (ref 3.5–5.2)
Sodium: 138 mmol/L (ref 134–144)
Total Protein: 6.1 g/dL (ref 6.0–8.5)
eGFR: 40 mL/min/1.73 — ABNORMAL LOW (ref >59)

## 2024-10-17 LAB — MAGNESIUM: Magnesium: 2.2 mg/dL (ref 1.6–2.3)

## 2024-10-19 NOTE — Progress Notes (Unsigned)
  Electrophysiology Office Note:   ID:  Travis Tran, DOB 1956/11/02, MRN [id number]  Primary Cardiologist: Oneil Parchment, MD Electrophysiologist: Soyla Gladis Norton, MD  {Click to update primary MD,subspecialty MD or APP then REFRESH:1}    History of Present Illness:   Travis Tran is a 68 y.o. male with h/o chronic systolic CHF, CAD s/p CABG, HTN, and paroxysmal AF seen today for acute visit due to ICD shock.    Patient reports ***.  he denies chest pain, palpitations, dyspnea, PND, orthopnea, nausea, vomiting, dizziness, syncope, edema, weight gain, or early satiety.   Review of systems complete and found to be negative unless listed in HPI.   EP Information / Studies Reviewed:    EKG is ordered today. Personal review as below.       ICD Interrogation-  reviewed in detail today,  See PACEART report.  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 Prox RCA lesion, 80% stenosed. Mid RCA to Dist RCA lesion, 100% stenosed. Native LCA severely diseased. Ost LM to LM lesion, 45% stenosed. Ost LAD lesion, 90% stenosed. Mid LAD lesion, 100% stenosed. Prox Cx lesion, 99% stenosed. 1st Mrg lesion, 99% stenosed. Mid Cx lesion, 100% stenosed. SVG-OM & SVG-Diag unable to visualized. Presumed to be 100% occluded Patent RIMA-LAD with diffuse disease in small caliber target LAD. Dist LAD-1 lesion, 95% stenosed. Dist LAD-2 lesion, 70% stenosed. Sequential SVG-rPDA-RPAV is large, and is anatomically normal. 2nd RPLB lesion, 90% stenosed. There is severe left ventricular systolic dysfunction. Dilated cardiomyopathy with elevated LVEDP and secondary pulmonary hypertension  Physical Exam:   VS:  There were no vitals taken for this visit.   Wt Readings from Last 3 Encounters:   04/09/24 137 lb (62.1 kg)  04/09/24 137 lb (62.1 kg)  08/06/23 158 lb (71.7 kg)     GEN: No acute distress *** NECK: No JVD; No carotid bruits CARDIAC: {EPRHYTHM:28826}, no murmurs, rubs, gallops RESPIRATORY:  Clear to auscultation without rales, wheezing or rhonchi  ABDOMEN: Soft, non-tender, non-distended EXTREMITIES:  {EDEMA LEVEL:28147::No} edema; No deformity   ASSESSMENT AND PLAN:    Chronic systolic CHF  s/p Medtronic single chamber ICD  euvolemic today Stable on an appropriate medical regimen Normal ICD function See Pace Art report No changes today  Update Echo.   VT/WCT Labs with stable electrolytes  HTN Stable on current regimen   CAD No s/s of ischemia.      Disposition:   Follow up with {EPPROVIDERS:28135::EP Team} {EPFOLLOW UP:28173}   Signed, Ozell Prentice Passey, PA-C

## 2024-10-20 ENCOUNTER — Encounter: Payer: Self-pay | Admitting: Student

## 2024-10-20 ENCOUNTER — Ambulatory Visit: Payer: Self-pay | Admitting: Cardiology

## 2024-10-20 ENCOUNTER — Ambulatory Visit: Attending: Student | Admitting: Student

## 2024-10-20 VITALS — BP 113/70 | HR 81 | Ht 66.0 in | Wt 137.8 lb

## 2024-10-20 DIAGNOSIS — I472 Ventricular tachycardia, unspecified: Secondary | ICD-10-CM | POA: Diagnosis not present

## 2024-10-20 DIAGNOSIS — I25709 Atherosclerosis of coronary artery bypass graft(s), unspecified, with unspecified angina pectoris: Secondary | ICD-10-CM | POA: Diagnosis not present

## 2024-10-20 DIAGNOSIS — I5022 Chronic systolic (congestive) heart failure: Secondary | ICD-10-CM

## 2024-10-20 DIAGNOSIS — Z95810 Presence of automatic (implantable) cardiac defibrillator: Secondary | ICD-10-CM

## 2024-10-20 DIAGNOSIS — I493 Ventricular premature depolarization: Secondary | ICD-10-CM

## 2024-10-20 LAB — CUP PACEART INCLINIC DEVICE CHECK
Battery Remaining Longevity: 20 mo
Battery Voltage: 2.91 V
Brady Statistic RV Percent Paced: 0.01 %
Date Time Interrogation Session: 20251118100211
HighPow Impedance: 68 Ohm
Implantable Lead Connection Status: 753985
Implantable Lead Implant Date: 20170808
Implantable Lead Location: 753860
Implantable Pulse Generator Implant Date: 20170808
Lead Channel Impedance Value: 247 Ohm
Lead Channel Impedance Value: 342 Ohm
Lead Channel Pacing Threshold Amplitude: 0.625 V
Lead Channel Pacing Threshold Pulse Width: 0.4 ms
Lead Channel Sensing Intrinsic Amplitude: 12.75 mV
Lead Channel Sensing Intrinsic Amplitude: 16.25 mV
Lead Channel Setting Pacing Amplitude: 2.5 V
Lead Channel Setting Pacing Pulse Width: 0.4 ms
Lead Channel Setting Sensing Sensitivity: 0.3 mV
Zone Setting Status: 755011
Zone Setting Status: 755011

## 2024-10-20 MED ORDER — METOPROLOL SUCCINATE ER 50 MG PO TB24: 50.0000 mg | ORAL_TABLET | Freq: Two times a day (BID) | ORAL | 3 refills | Status: AC

## 2024-10-20 NOTE — Patient Instructions (Signed)
 Medication Instructions:  STOP COREG  Start Toprol 50 mg twice daily *If you need a refill on your cardiac medications before your next appointment, please call your pharmacy*  Lab Work: No labwork ordered today. If you have labs (blood work) drawn today and your tests are completely normal, you will receive your results only by: MyChart Message (if you have MyChart) OR A paper copy in the mail If you have any lab test that is abnormal or we need to change your treatment, we will call you to review the results.  Testing/Procedures: Echocardiogram ordered today.   Follow-Up: At [facility], you and your health needs are our priority.  As part of our continuing mission to provide you with exceptional heart care, our providers are all part of one team.  This team includes your primary Cardiologist (physician) and Advanced Practice Providers or APPs (Physician Assistants and Nurse Practitioners) who all work together to provide you with the care you need, when you need it.  Your next appointment:   3-4 months  Provider:   You may see Will Gladis Norton, MD or one of the following Advanced Practice Providers on your designated Care Team:   Charlies Arthur, PA-C Missey Hasley Andy Aryel Edelen, PA-C Suzann Riddle, NP Daphne Barrack, NP    We recommend signing up for the patient portal called MyChart.  Sign up information is provided on this After Visit Summary.  MyChart is used to connect with patients for Virtual Visits (Telemedicine).  Patients are able to view lab/test results, encounter notes, upcoming appointments, etc.  Non-urgent messages can be sent to your provider as well.   To learn more about what you can do with MyChart, go to [email or web address].

## 2024-10-26 ENCOUNTER — Ambulatory Visit: Attending: Cardiology

## 2024-10-26 DIAGNOSIS — Z95810 Presence of automatic (implantable) cardiac defibrillator: Secondary | ICD-10-CM | POA: Diagnosis not present

## 2024-10-26 DIAGNOSIS — I5022 Chronic systolic (congestive) heart failure: Secondary | ICD-10-CM | POA: Diagnosis not present

## 2024-10-28 NOTE — Progress Notes (Signed)
 EPIC Encounter for ICM Monitoring  Patient Name: Travis Tran is a 68 y.o. male Date: 10/28/2024 Primary Care Physican: Pcp, No Primary Cardiologist: Jeffrie Electrophysiologist: Inocencio Nephrologist: Greta at [facility] 08/06/2023 Office Weight: 158 lbs 11/13/2023 Weight: 148-150 lbs 12/20/2023 Weight: 140 - 142 lbs (taking Ozempic) 01/23/2024 Weight: 139-140 lbs 04/09/2024 Office Weight: 137 lbs 07/03/2024 Home Weight: 131-133 lbs 08/14/2024 Weight: 130-135 lbs 10/20/2024 Office Weight: 137 lbs   Spoke with patient and heart failure questions reviewed.  Transmission results reviewed.  Pt asymptomatic for fluid accumulation.  He did have a device shock on 10/11/2024 and followed up with [name] Passey, PA in the office.  Echo scheduled for 12/04/2024.    Diet:  Is not strict with limiting salt intake   Since 09/21/2024 ICM Remote Transmission: Optivol thoracic impedance suggesting normal fluid levels with the exception of possible fluid accumulation starting 10/20/2024 and returned to baseline 10/27/2024.   Prescribed:  Furosemide  20 mg Take 2 tablet(s) (40 mg total) by mouth daily.    Labs: 10/16/2024 Creatinine 1.85, BUN 29, Potassium 4.7, Sodium 138, GFR 40 08/17/2024 Creatinine 2,       BUN 42, Potassium 4.1, Sodium 147, GFR 36.09 04/22/2024 Creatinine 2.01, BUN 30, Potassium 4.2, Sodium 139, GFR 36 04/06/2024 Creatinine 2.2,   BUN 35, Potassium 4.4, Sodium 143, GFR 32.98 01/10/2024 Creatinine 2.1,   BUN 42, Potassium 3.9, Sodium 143, GFR 33.59 A complete set of results can be found in Results Review.      Recommendations:  No changes and encouraged to call if experiencing any fluid symptoms.    Follow-up plan: ICM clinic phone appointment on 12/07/2024.   91 day device clinic remote transmission 01/11/2025.     EP/Cardiology Office Visits:  Recall 04/04/2025 with Dr Jeffrie.  Recall 04/22/2025 with EP APP.  01/19/2025 with [name] Passey, PA.   Copy of ICM check sent to Dr.  Inocencio.  Remote monitoring is medically necessary for Heart Failure Management.    Daily Thoracic Impedance ICM trend: 07/28/2024 through 10/26/2024.    12-14 Month Thoracic Impedance ICM trend:     Mitzie GORMAN Garner, RN 10/28/2024 10:29 AM

## 2024-12-04 ENCOUNTER — Ambulatory Visit: Attending: Student

## 2024-12-04 DIAGNOSIS — I5022 Chronic systolic (congestive) heart failure: Secondary | ICD-10-CM

## 2024-12-04 LAB — ECHOCARDIOGRAM COMPLETE
AR max vel: 2.97 cm2
AV Area VTI: 2.82 cm2
AV Area mean vel: 2.91 cm2
AV Mean grad: 2 mmHg
AV Peak grad: 4 mmHg
Ao pk vel: 1 m/s
Area-P 1/2: 3.77 cm2
S' Lateral: 3.94 cm

## 2024-12-07 ENCOUNTER — Ambulatory Visit

## 2024-12-07 DIAGNOSIS — Z95810 Presence of automatic (implantable) cardiac defibrillator: Secondary | ICD-10-CM

## 2024-12-07 DIAGNOSIS — I5022 Chronic systolic (congestive) heart failure: Secondary | ICD-10-CM

## 2024-12-07 NOTE — Progress Notes (Signed)
 EPIC Encounter for ICM Monitoring  Patient Name: Travis Tran is a 69 y.o. male Date: 12/07/2024 Primary Care Physican: Pcp, No Primary Cardiologist: Jeffrie Electrophysiologist: Inocencio Nephrologist: Greta at [facility] 08/06/2023 Office Weight: 158 lbs 11/13/2023 Weight: 148-150 lbs 12/20/2023 Weight: 140 - 142 lbs (taking Ozempic) 01/23/2024 Weight: 139-140 lbs 04/09/2024 Office Weight: 137 lbs 07/03/2024 Home Weight: 131-133 lbs 08/14/2024 Weight: 130-135 lbs 10/20/2024 Office Weight: 137 lbs 12/07/2024 Weight:  135-136 lbs   Spoke with patient and heart failure questions reviewed.  Transmission results reviewed.  Pt asymptomatic for fluid accumulation.     Diet:  Is not strict with limiting salt intake   Since 10/26/2024 ICM Remote Transmission: Optivol thoracic impedance suggesting possible fluid accumulation starting 11/24/2024 but trending back toward baseline.   Prescribed:  Furosemide  20 mg Take 2 tablet(s) (40 mg total) by mouth daily.    Labs: 10/16/2024 Creatinine 1.85, BUN 29, Potassium 4.7, Sodium 138, GFR 40 08/17/2024 Creatinine 2,       BUN 42, Potassium 4.1, Sodium 147, GFR 36.09 04/22/2024 Creatinine 2.01, BUN 30, Potassium 4.2, Sodium 139, GFR 36 04/06/2024 Creatinine 2.2,   BUN 35, Potassium 4.4, Sodium 143, GFR 32.98 01/10/2024 Creatinine 2.1,   BUN 42, Potassium 3.9, Sodium 143, GFR 33.59 A complete set of results can be found in Results Review.      Recommendations:  No changes and encouraged to call if experiencing any fluid symptoms.    Follow-up plan: ICM clinic phone appointment on 01/07/2025.   91 day device clinic remote transmission 01/11/2025.     EP/Cardiology Office Visits:  Recall 04/04/2025 with Dr Jeffrie.  Recall 04/22/2025 with EP APP.  01/19/2025 with [name] Passey, PA.   Copy of ICM check sent to Dr. Inocencio.  Remote monitoring is medically necessary for Heart Failure Management.    Daily Thoracic Impedance ICM trend: 09/07/2024  through 12/07/2024.    12-14 Month Thoracic Impedance ICM trend:     Mitzie GORMAN Garner, RN 12/07/2024 4:46 PM

## 2024-12-09 ENCOUNTER — Telehealth: Payer: Self-pay | Admitting: Cardiology

## 2024-12-09 DIAGNOSIS — Z95810 Presence of automatic (implantable) cardiac defibrillator: Secondary | ICD-10-CM

## 2024-12-09 DIAGNOSIS — I472 Ventricular tachycardia, unspecified: Secondary | ICD-10-CM

## 2024-12-09 DIAGNOSIS — I5022 Chronic systolic (congestive) heart failure: Secondary | ICD-10-CM

## 2024-12-09 NOTE — Telephone Encounter (Signed)
 Patient is returning call for results. Please advise

## 2024-12-09 NOTE — Telephone Encounter (Addendum)
 Attempted to reach pt, voicemail full, unable to leave message  (This is in reference to 12/04/24 echocardiogram result)

## 2024-12-10 NOTE — Telephone Encounter (Signed)
 Pt returning call. Please advise.

## 2024-12-10 NOTE — Telephone Encounter (Signed)
 Travis Prentice Passey, PA-C 12/04/2024 12:46 PM EST     EF is 30-35%, down from 40-45%.   Given VT with ICD shock and h/o CABG, would recommend L/RHC with a interventional doc (pt does have grafts when scheduling).   We discussed risks and benefits at office visit 10/20/2024; So OK to schedule without further office visit; Happy to call pt if he has questions -> and he will need pre cath lab work given CKD.     The patient has been notified of the result and verbalized understanding.  All questions (if any) were answered. [name], RN 12/10/2024 9:38 AM    Patient would like to discuss this further with [name] before scheduling.

## 2024-12-10 NOTE — Telephone Encounter (Signed)
 Spoke with the patient and scheduled him for a heart cath with Dr. Verlin on 1/16. Labs have been ordered. Will send a message through MyChart with instructions.

## 2024-12-12 LAB — BASIC METABOLIC PANEL WITH GFR
BUN/Creatinine Ratio: 15 (ref 10–24)
BUN: 25 mg/dL (ref 8–27)
CO2: 24 mmol/L (ref 20–29)
Calcium: 9.3 mg/dL (ref 8.6–10.2)
Chloride: 101 mmol/L (ref 96–106)
Creatinine, Ser: 1.7 mg/dL — ABNORMAL HIGH (ref 0.76–1.27)
Glucose: 121 mg/dL — ABNORMAL HIGH (ref 70–99)
Potassium: 4.2 mmol/L (ref 3.5–5.2)
Sodium: 139 mmol/L (ref 134–144)
eGFR: 44 mL/min/1.73 — ABNORMAL LOW

## 2024-12-12 LAB — CBC
Hematocrit: 45.3 % (ref 37.5–51.0)
Hemoglobin: 14.2 g/dL (ref 13.0–17.7)
MCH: 27.9 pg (ref 26.6–33.0)
MCHC: 31.3 g/dL — ABNORMAL LOW (ref 31.5–35.7)
MCV: 89 fL (ref 79–97)
Platelets: 235 x10E3/uL (ref 150–450)
RBC: 5.09 x10E6/uL (ref 4.14–5.80)
RDW: 12.9 % (ref 11.6–15.4)
WBC: 10 x10E3/uL (ref 3.4–10.8)

## 2024-12-13 ENCOUNTER — Ambulatory Visit: Payer: Self-pay | Admitting: Student

## 2024-12-14 ENCOUNTER — Ambulatory Visit: Attending: Student | Admitting: Student

## 2024-12-14 ENCOUNTER — Telehealth: Payer: Self-pay | Admitting: Cardiovascular Disease

## 2024-12-14 NOTE — Telephone Encounter (Signed)
 Pt requesting a c/b to reschedule his heart cath procedure

## 2024-12-14 NOTE — Progress Notes (Signed)
" °  ° °  Electrophysiology TeleHealth Note  Verbal consent was obtained from the patent by me for this telephone visit today.  Date:  12/14/2024   ID:  Travis Tran, DOB Jan 01, 1956, MRN [id number]  Location: patient's home  Provider location: [facility]  Evaluation Performed: Follow-up visit  PCP:  Pcp, No  Electrophysiologist:  Dr Kelsie  Chief Complaint:  CAD, VT  History of Present Illness:    Travis Tran is a 69 y.o. male who presents via audio conferencing for a telehealth visit today.  Since last being seen in our clinic, the patient reports doing very well.  Virtual visit today for H&P pre cath on 12/19/2023.   No new complaints. Denies undue SOB, syncope, or chest pain at this time.   Allergies, Medical, Surgical, Social, and Family Histories have been reviewed and are referenced here-in when relevant for medical decision making.  Allergies:   Celecoxib and Sulfa antibiotics   Social History:  The patient  reports that he has never smoked. He has never used smokeless tobacco. He reports that he does not drink alcohol and does not use drugs.   Family History:  The patient's family history includes Cancer in his mother; Heart attack (age of onset: 74) in his father; Heart disease in his father and mother; Stroke in his father.   ROS:  Please see the history of present illness.   All other systems are personally reviewed and negative.   Exam:    Well sounding, not distressed Normal respiratory effort   Labs/Other Tests and Data Reviewed:    Recent Labs: 10/16/2024: ALT 12; Magnesium 2.2 12/11/2024: BUN 25; Creatinine, Ser 1.70; Hemoglobin 14.2; Platelets 235; Potassium 4.2; Sodium 139   Wt Readings from Last 3 Encounters:  10/20/24 137 lb 12.8 oz (62.5 kg)  04/09/24 137 lb (62.1 kg)  04/09/24 137 lb (62.1 kg)     Other studies personally reviewed: Echo 12/04/2024 LVEF down to 30-35% from 40-45   ASSESSMENT & PLAN:    CAD s/p CABG No current s/s of ischemia, but  concern for worsening with lower EF and new VT.  The patient understands that risks included but are not limited to stroke (1 in 1000), death (1 in 1000), kidney failure [usually temporary] (1 in 500), bleeding (1 in 200), allergic reaction [possibly serious] (1 in 200).  The patient understands and agrees to proceed.    VT/WCT No further by alerts  Chronic systolic CHF Maximize GDMT as tolerated post cath.   Today, I have spent 10 minutes with the patient with telehealth technology discussing the above.    Bonney Ozell Prentice Lesia, PA-C  12/14/2024 10:00 AM       "

## 2024-12-14 NOTE — H&P (View-Only) (Signed)
" °  ° °  Electrophysiology TeleHealth Note  Verbal consent was obtained from the patent by me for this telephone visit today.  Date:  12/14/2024   ID:  Travis Tran, DOB Jan 01, 1956, MRN [id number]  Location: patient's home  Provider location: [facility]  Evaluation Performed: Follow-up visit  PCP:  Pcp, No  Electrophysiologist:  Dr Kelsie  Chief Complaint:  CAD, VT  History of Present Illness:    Travis Tran is a 69 y.o. male who presents via audio conferencing for a telehealth visit today.  Since last being seen in our clinic, the patient reports doing very well.  Virtual visit today for H&P pre cath on 12/19/2023.   No new complaints. Denies undue SOB, syncope, or chest pain at this time.   Allergies, Medical, Surgical, Social, and Family Histories have been reviewed and are referenced here-in when relevant for medical decision making.  Allergies:   Celecoxib and Sulfa antibiotics   Social History:  The patient  reports that he has never smoked. He has never used smokeless tobacco. He reports that he does not drink alcohol and does not use drugs.   Family History:  The patient's family history includes Cancer in his mother; Heart attack (age of onset: 74) in his father; Heart disease in his father and mother; Stroke in his father.   ROS:  Please see the history of present illness.   All other systems are personally reviewed and negative.   Exam:    Well sounding, not distressed Normal respiratory effort   Labs/Other Tests and Data Reviewed:    Recent Labs: 10/16/2024: ALT 12; Magnesium 2.2 12/11/2024: BUN 25; Creatinine, Ser 1.70; Hemoglobin 14.2; Platelets 235; Potassium 4.2; Sodium 139   Wt Readings from Last 3 Encounters:  10/20/24 137 lb 12.8 oz (62.5 kg)  04/09/24 137 lb (62.1 kg)  04/09/24 137 lb (62.1 kg)     Other studies personally reviewed: Echo 12/04/2024 LVEF down to 30-35% from 40-45   ASSESSMENT & PLAN:    CAD s/p CABG No current s/s of ischemia, but  concern for worsening with lower EF and new VT.  The patient understands that risks included but are not limited to stroke (1 in 1000), death (1 in 1000), kidney failure [usually temporary] (1 in 500), bleeding (1 in 200), allergic reaction [possibly serious] (1 in 200).  The patient understands and agrees to proceed.    VT/WCT No further by alerts  Chronic systolic CHF Maximize GDMT as tolerated post cath.   Today, I have spent 10 minutes with the patient with telehealth technology discussing the above.    Bonney Ozell Prentice Lesia, PA-C  12/14/2024 10:00 AM       "

## 2024-12-15 NOTE — Telephone Encounter (Signed)
 Pt reporting his ride Friday cannot pick him up until after work, so it would be 6pm or after. Discussed with Delon Cheney in cath lab -  pt aware we do not need to r/s this and can wait for his ride that evening, if he does not have to stay overnight. Pt appreciates my checking on this.

## 2024-12-17 ENCOUNTER — Telehealth: Payer: Self-pay | Admitting: *Deleted

## 2024-12-17 NOTE — Telephone Encounter (Signed)
 Call to patient to review procedure instructions, mailbox full, will send instructions via MyChart.

## 2024-12-17 NOTE — Telephone Encounter (Signed)
 Patient returned RN's call.

## 2024-12-17 NOTE — Telephone Encounter (Signed)
 Reviewed procedure instructions with patient.

## 2024-12-17 NOTE — Telephone Encounter (Signed)
 Patient does have complete dextrocardia.  If same day discharge, pt will not have transportation until 6 PM.

## 2024-12-18 ENCOUNTER — Encounter (HOSPITAL_COMMUNITY): Admission: RE | Disposition: A | Payer: Self-pay | Source: Home / Self Care | Attending: Cardiovascular Disease

## 2024-12-18 ENCOUNTER — Other Ambulatory Visit: Payer: Self-pay

## 2024-12-18 ENCOUNTER — Ambulatory Visit (HOSPITAL_COMMUNITY)
Admission: RE | Admit: 2024-12-18 | Discharge: 2024-12-18 | Disposition: A | Discharge status: Home / Self Care | Attending: Cardiovascular Disease | Admitting: Cardiovascular Disease

## 2024-12-18 DIAGNOSIS — Z951 Presence of aortocoronary bypass graft: Secondary | ICD-10-CM | POA: Insufficient documentation

## 2024-12-18 DIAGNOSIS — I472 Ventricular tachycardia, unspecified: Secondary | ICD-10-CM | POA: Insufficient documentation

## 2024-12-18 DIAGNOSIS — I5022 Chronic systolic (congestive) heart failure: Secondary | ICD-10-CM | POA: Insufficient documentation

## 2024-12-18 DIAGNOSIS — I2510 Atherosclerotic heart disease of native coronary artery without angina pectoris: Secondary | ICD-10-CM | POA: Diagnosis not present

## 2024-12-18 HISTORY — PX: RIGHT/LEFT HEART CATH AND CORONARY/GRAFT ANGIOGRAPHY: CATH118267

## 2024-12-18 LAB — POCT I-STAT 7, (LYTES, BLD GAS, ICA,H+H)
Acid-base deficit: 5 mmol/L — ABNORMAL HIGH (ref 0.0–2.0)
Bicarbonate: 18.3 mmol/L — ABNORMAL LOW (ref 20.0–28.0)
Calcium, Ion: 0.86 mmol/L — CL (ref 1.15–1.40)
HCT: 30 % — ABNORMAL LOW (ref 39.0–52.0)
Hemoglobin: 10.2 g/dL — ABNORMAL LOW (ref 13.0–17.0)
O2 Saturation: 96 %
Potassium: 2.9 mmol/L — ABNORMAL LOW (ref 3.5–5.1)
Sodium: 151 mmol/L — ABNORMAL HIGH (ref 135–145)
TCO2: 19 mmol/L — ABNORMAL LOW (ref 22–32)
pCO2 arterial: 29.1 mmHg — ABNORMAL LOW (ref 32–48)
pH, Arterial: 7.407 (ref 7.35–7.45)
pO2, Arterial: 80 mmHg — ABNORMAL LOW (ref 83–108)

## 2024-12-18 LAB — POCT I-STAT EG7
Acid-base deficit: 4 mmol/L — ABNORMAL HIGH (ref 0.0–2.0)
Bicarbonate: 20.8 mmol/L (ref 20.0–28.0)
Calcium, Ion: 0.91 mmol/L — ABNORMAL LOW (ref 1.15–1.40)
HCT: 31 % — ABNORMAL LOW (ref 39.0–52.0)
Hemoglobin: 10.5 g/dL — ABNORMAL LOW (ref 13.0–17.0)
O2 Saturation: 70 %
Potassium: 3 mmol/L — ABNORMAL LOW (ref 3.5–5.1)
Sodium: 147 mmol/L — ABNORMAL HIGH (ref 135–145)
TCO2: 22 mmol/L (ref 22–32)
pCO2, Ven: 37.4 mmHg — ABNORMAL LOW (ref 44–60)
pH, Ven: 7.353 (ref 7.25–7.43)
pO2, Ven: 38 mmHg (ref 32–45)

## 2024-12-18 LAB — GLUCOSE, CAPILLARY
Glucose-Capillary: 134 mg/dL — ABNORMAL HIGH (ref 70–99)
Glucose-Capillary: 140 mg/dL — ABNORMAL HIGH (ref 70–99)

## 2024-12-18 MED ORDER — HEPARIN (PORCINE) IN NACL 1000-0.9 UT/500ML-% IV SOLN
INTRAVENOUS | Status: DC | PRN
  Administered 2024-12-18: 500 mL

## 2024-12-18 MED ORDER — MIDAZOLAM HCL (PF) 2 MG/2ML IJ SOLN
INTRAMUSCULAR | Status: DC | PRN
  Administered 2024-12-18: 2 mg via INTRAVENOUS

## 2024-12-18 MED ORDER — HEPARIN SODIUM (PORCINE) 1000 UNIT/ML IJ SOLN
INTRAMUSCULAR | Status: DC | PRN
  Administered 2024-12-18: 3000 [IU] via INTRAVENOUS

## 2024-12-18 MED ORDER — FREE WATER: 500.0000 mL | Freq: Once | Status: DC

## 2024-12-18 MED ORDER — ASPIRIN 81 MG PO CHEW: 81.0000 mg | CHEWABLE_TABLET | ORAL | Status: DC

## 2024-12-18 MED ORDER — SODIUM CHLORIDE 0.9 % WEIGHT BASED INFUSION: 1.0000 mL/kg/h | INTRAVENOUS | Status: DC

## 2024-12-18 MED ORDER — VERAPAMIL HCL 2.5 MG/ML IV SOLN
INTRAVENOUS | Status: AC
  Filled 2024-12-18: qty 2

## 2024-12-18 MED ORDER — FENTANYL CITRATE (PF) 100 MCG/2ML IJ SOLN
INTRAMUSCULAR | Status: DC | PRN
  Administered 2024-12-18: 50 ug via INTRAVENOUS

## 2024-12-18 MED ORDER — LIDOCAINE HCL (PF) 1 % IJ SOLN
INTRAMUSCULAR | Status: AC
  Filled 2024-12-18: qty 30

## 2024-12-18 MED ORDER — ACETAMINOPHEN 325 MG PO TABS: 650.0000 mg | ORAL_TABLET | ORAL | Status: DC | PRN

## 2024-12-18 MED ORDER — IOHEXOL 350 MG/ML SOLN
INTRAVENOUS | Status: DC | PRN
  Administered 2024-12-18: 55 mL

## 2024-12-18 MED ORDER — MIDAZOLAM HCL 2 MG/2ML IJ SOLN
INTRAMUSCULAR | Status: AC
  Filled 2024-12-18: qty 2

## 2024-12-18 MED ORDER — FENTANYL CITRATE (PF) 100 MCG/2ML IJ SOLN
INTRAMUSCULAR | Status: AC
  Filled 2024-12-18: qty 2

## 2024-12-18 MED ORDER — HEPARIN SODIUM (PORCINE) 1000 UNIT/ML IJ SOLN
INTRAMUSCULAR | Status: AC
  Filled 2024-12-18: qty 10

## 2024-12-18 MED ORDER — LABETALOL HCL 5 MG/ML IV SOLN: 10.0000 mg | INTRAVENOUS | Status: AC | PRN

## 2024-12-18 MED ORDER — LIDOCAINE HCL (PF) 1 % IJ SOLN
INTRAMUSCULAR | Status: DC | PRN
  Administered 2024-12-18: 5 mL via INTRADERMAL

## 2024-12-18 MED ORDER — SODIUM CHLORIDE 0.9 % IV SOLN: INTRAVENOUS | Status: DC

## 2024-12-18 MED ORDER — VERAPAMIL HCL 2.5 MG/ML IV SOLN
INTRAVENOUS | Status: DC | PRN
  Administered 2024-12-18: 10 mL via INTRA_ARTERIAL

## 2024-12-18 MED ORDER — SODIUM CHLORIDE 0.9 % IV SOLN: 250.0000 mL | INTRAVENOUS | Status: DC | PRN

## 2024-12-18 MED ORDER — SODIUM CHLORIDE 0.9 % WEIGHT BASED INFUSION: 3.0000 mL/kg/h | INTRAVENOUS | Status: DC

## 2024-12-18 MED ORDER — SODIUM CHLORIDE 0.9% FLUSH: 3.0000 mL | INTRAVENOUS | Status: DC | PRN

## 2024-12-18 MED ORDER — SODIUM CHLORIDE 0.9% FLUSH: 3.0000 mL | Freq: Two times a day (BID) | INTRAVENOUS | Status: DC

## 2024-12-18 MED ORDER — HYDRALAZINE HCL 20 MG/ML IJ SOLN: 10.0000 mg | INTRAMUSCULAR | Status: AC | PRN

## 2024-12-18 MED ORDER — ONDANSETRON HCL 4 MG/2ML IJ SOLN: 4.0000 mg | Freq: Four times a day (QID) | INTRAMUSCULAR | Status: DC | PRN

## 2024-12-18 NOTE — Discharge Instructions (Signed)

## 2024-12-18 NOTE — Interval H&P Note (Signed)
 History and Physical Interval Note:  12/18/2024 9:07 AM  Nancyann KANDICE Ada  has presented today for surgery, with the diagnosis of v tach.  The various methods of treatment have been discussed with the patient and family. After consideration of risks, benefits and other options for treatment, the patient has consented to  Procedures: RIGHT/LEFT HEART CATH AND CORONARY/GRAFT ANGIOGRAPHY (N/A) as a surgical intervention.  The patient's history has been reviewed, patient examined, no change in status, stable for surgery.  I have reviewed the patient's chart and labs.  Questions were answered to the patient's satisfaction.    Cath Lab Visit (complete for each Cath Lab visit)  Clinical Evaluation Leading to the Procedure:   ACS: No.  Non-ACS:    Anginal Classification: No Symptoms  Anti-ischemic medical therapy: Minimal Therapy (1 class of medications)  Non-Invasive Test Results: No non-invasive testing performed  Prior CABG: Previous CABG        Lonni Cash

## 2024-12-18 NOTE — Progress Notes (Signed)
 Pt and friend received discharge instructions, teach back performed. Iv removed, no complications. Rt radial site is clean dry intact, site is soft, no signs of bleeding. Pt escorted out via wheelchair to friend's vehicle.

## 2024-12-19 ENCOUNTER — Encounter (HOSPITAL_COMMUNITY): Payer: Self-pay | Admitting: Cardiovascular Disease

## 2024-12-22 ENCOUNTER — Telehealth: Payer: Self-pay | Admitting: *Deleted

## 2024-12-22 DIAGNOSIS — I5022 Chronic systolic (congestive) heart failure: Secondary | ICD-10-CM

## 2024-12-22 NOTE — Telephone Encounter (Signed)
-----   Message from Travis Tran sent at 12/22/2024 11:13 AM EST -----  His cath showed stable disease, but no targets for intervention.   He previously saw Dr. Nelle team in 2019;  With his EF down again can we please re-refer him to HF clinic?     Will also discuss further at upcoming visit with me

## 2024-12-22 NOTE — Telephone Encounter (Signed)
 Spoke with pt and provided him with msg below per [place]. Pt states his understanding and agrees with this plan. Referral placed, pt aware that he will receive a call to schedule.

## 2024-12-22 NOTE — Progress Notes (Signed)
 See phone note

## 2024-12-31 NOTE — Progress Notes (Signed)
 31 day ICM Remote transmission canceled due to [facility] clinic is on hold until further notice.  91 day remote monitoring will continue per protocol.

## 2025-01-07 ENCOUNTER — Ambulatory Visit

## 2025-01-11 ENCOUNTER — Ambulatory Visit

## 2025-01-19 ENCOUNTER — Ambulatory Visit: Admitting: Student

## 2025-04-12 ENCOUNTER — Ambulatory Visit

## 2025-07-12 ENCOUNTER — Ambulatory Visit
